# Patient Record
Sex: Female | Born: 1935 | Race: White | Hispanic: No | Marital: Married | Smoking: Never smoker
Health system: Southern US, Community
[De-identification: ages and names within clinical notes are randomized; demographics above are authoritative.]

## PROBLEM LIST (undated history)

## (undated) DIAGNOSIS — M1990 Unspecified osteoarthritis, unspecified site: Secondary | ICD-10-CM

## (undated) DIAGNOSIS — M797 Fibromyalgia: Secondary | ICD-10-CM

## (undated) DIAGNOSIS — M549 Dorsalgia, unspecified: Secondary | ICD-10-CM

## (undated) DIAGNOSIS — J45909 Unspecified asthma, uncomplicated: Secondary | ICD-10-CM

## (undated) DIAGNOSIS — K219 Gastro-esophageal reflux disease without esophagitis: Secondary | ICD-10-CM

## (undated) DIAGNOSIS — Z973 Presence of spectacles and contact lenses: Secondary | ICD-10-CM

## (undated) DIAGNOSIS — J329 Chronic sinusitis, unspecified: Secondary | ICD-10-CM

## (undated) DIAGNOSIS — M353 Polymyalgia rheumatica: Secondary | ICD-10-CM

## (undated) DIAGNOSIS — G8929 Other chronic pain: Secondary | ICD-10-CM

## (undated) DIAGNOSIS — F419 Anxiety disorder, unspecified: Secondary | ICD-10-CM

## (undated) DIAGNOSIS — M1711 Unilateral primary osteoarthritis, right knee: Secondary | ICD-10-CM

## (undated) HISTORY — PX: JOINT REPLACEMENT: SHX530

## (undated) HISTORY — PX: ABSCESS DRAINAGE: SHX1119

## (undated) HISTORY — PX: CATARACT EXTRACTION W/ INTRAOCULAR LENS  IMPLANT, BILATERAL: SHX1307

## (undated) HISTORY — PX: CORNEAL TRANSPLANT: SHX108

## (undated) HISTORY — PX: FRACTURE SURGERY: SHX138

## (undated) HISTORY — PX: EYE SURGERY: SHX253

---

## 2004-06-19 ENCOUNTER — Ambulatory Visit: Payer: Self-pay | Admitting: Internal Medicine

## 2004-07-23 ENCOUNTER — Ambulatory Visit: Payer: Self-pay | Admitting: Internal Medicine

## 2005-02-11 ENCOUNTER — Ambulatory Visit: Payer: Self-pay | Admitting: Internal Medicine

## 2005-07-29 ENCOUNTER — Ambulatory Visit: Payer: Self-pay | Admitting: Internal Medicine

## 2005-10-21 ENCOUNTER — Ambulatory Visit: Payer: Self-pay | Admitting: Internal Medicine

## 2006-11-04 ENCOUNTER — Ambulatory Visit: Payer: Self-pay | Admitting: Internal Medicine

## 2007-02-12 ENCOUNTER — Ambulatory Visit: Payer: Self-pay | Admitting: Family Medicine

## 2007-02-12 DIAGNOSIS — J069 Acute upper respiratory infection, unspecified: Secondary | ICD-10-CM | POA: Insufficient documentation

## 2007-02-12 LAB — CONVERTED CEMR LAB: Rapid Strep: NEGATIVE

## 2007-02-16 ENCOUNTER — Encounter: Payer: Self-pay | Admitting: Internal Medicine

## 2007-02-17 ENCOUNTER — Telehealth (INDEPENDENT_AMBULATORY_CARE_PROVIDER_SITE_OTHER): Payer: Self-pay | Admitting: *Deleted

## 2007-03-31 ENCOUNTER — Telehealth (INDEPENDENT_AMBULATORY_CARE_PROVIDER_SITE_OTHER): Payer: Self-pay | Admitting: *Deleted

## 2007-06-21 ENCOUNTER — Telehealth (INDEPENDENT_AMBULATORY_CARE_PROVIDER_SITE_OTHER): Payer: Self-pay | Admitting: *Deleted

## 2007-07-15 ENCOUNTER — Ambulatory Visit: Payer: Self-pay | Admitting: Internal Medicine

## 2007-07-15 DIAGNOSIS — I10 Essential (primary) hypertension: Secondary | ICD-10-CM | POA: Insufficient documentation

## 2007-07-15 DIAGNOSIS — E559 Vitamin D deficiency, unspecified: Secondary | ICD-10-CM | POA: Insufficient documentation

## 2007-07-15 DIAGNOSIS — R5381 Other malaise: Secondary | ICD-10-CM | POA: Insufficient documentation

## 2007-07-15 DIAGNOSIS — K209 Esophagitis, unspecified: Secondary | ICD-10-CM

## 2007-07-15 DIAGNOSIS — R5383 Other fatigue: Secondary | ICD-10-CM

## 2007-07-16 ENCOUNTER — Telehealth (INDEPENDENT_AMBULATORY_CARE_PROVIDER_SITE_OTHER): Payer: Self-pay | Admitting: *Deleted

## 2007-07-17 LAB — CONVERTED CEMR LAB
BUN: 14 mg/dL (ref 6–23)
Basophils Absolute: 0 10*3/uL (ref 0.0–0.1)
Basophils Relative: 0.6 % (ref 0.0–1.0)
CO2: 32 meq/L (ref 19–32)
Calcium: 9.4 mg/dL (ref 8.4–10.5)
Chloride: 100 meq/L (ref 96–112)
Creatinine, Ser: 1.1 mg/dL (ref 0.4–1.2)
Free T4: 1 ng/dL (ref 0.6–1.6)
MCHC: 32.5 g/dL (ref 30.0–36.0)
Monocytes Relative: 4 % (ref 3.0–11.0)
RBC: 4.85 M/uL (ref 3.87–5.11)
RDW: 12.2 % (ref 11.5–14.6)
TSH: 1.26 microintl units/mL (ref 0.35–5.50)

## 2007-07-19 ENCOUNTER — Encounter (INDEPENDENT_AMBULATORY_CARE_PROVIDER_SITE_OTHER): Payer: Self-pay | Admitting: *Deleted

## 2007-07-20 ENCOUNTER — Encounter (INDEPENDENT_AMBULATORY_CARE_PROVIDER_SITE_OTHER): Payer: Self-pay | Admitting: *Deleted

## 2007-07-22 ENCOUNTER — Encounter (INDEPENDENT_AMBULATORY_CARE_PROVIDER_SITE_OTHER): Payer: Self-pay | Admitting: *Deleted

## 2007-07-26 ENCOUNTER — Telehealth (INDEPENDENT_AMBULATORY_CARE_PROVIDER_SITE_OTHER): Payer: Self-pay | Admitting: *Deleted

## 2007-07-27 ENCOUNTER — Encounter: Payer: Self-pay | Admitting: Internal Medicine

## 2007-07-27 ENCOUNTER — Telehealth (INDEPENDENT_AMBULATORY_CARE_PROVIDER_SITE_OTHER): Payer: Self-pay | Admitting: *Deleted

## 2010-04-26 ENCOUNTER — Encounter: Admission: RE | Admit: 2010-04-26 | Discharge: 2010-04-26 | Payer: Self-pay | Admitting: Internal Medicine

## 2012-06-08 ENCOUNTER — Other Ambulatory Visit (HOSPITAL_COMMUNITY): Payer: Self-pay | Admitting: Internal Medicine

## 2012-06-14 ENCOUNTER — Encounter (HOSPITAL_COMMUNITY): Payer: Self-pay

## 2013-06-08 ENCOUNTER — Encounter (HOSPITAL_COMMUNITY)
Admission: EM | Disposition: A | Discharge status: Home / Self Care | Payer: Self-pay | Source: Home / Self Care | Attending: Orthopedic Surgery

## 2013-06-08 ENCOUNTER — Inpatient Hospital Stay (HOSPITAL_COMMUNITY): Payer: Medicare Other | Admitting: Anesthesiology

## 2013-06-08 ENCOUNTER — Inpatient Hospital Stay (HOSPITAL_COMMUNITY): Payer: Medicare Other

## 2013-06-08 ENCOUNTER — Emergency Department (HOSPITAL_COMMUNITY): Payer: Medicare Other

## 2013-06-08 ENCOUNTER — Encounter (HOSPITAL_COMMUNITY): Payer: Medicare Other | Admitting: Anesthesiology

## 2013-06-08 ENCOUNTER — Encounter (HOSPITAL_COMMUNITY): Payer: Self-pay | Admitting: Emergency Medicine

## 2013-06-08 ENCOUNTER — Inpatient Hospital Stay (HOSPITAL_COMMUNITY)
Admission: EM | Admit: 2013-06-08 | Discharge: 2013-06-10 | DRG: 494 | Disposition: A | Discharge status: Home / Self Care | Payer: Medicare Other | Attending: Orthopedic Surgery | Admitting: Orthopedic Surgery

## 2013-06-08 DIAGNOSIS — S82843B Displaced bimalleolar fracture of unspecified lower leg, initial encounter for open fracture type I or II: Principal | ICD-10-CM | POA: Diagnosis present

## 2013-06-08 DIAGNOSIS — M353 Polymyalgia rheumatica: Secondary | ICD-10-CM | POA: Diagnosis present

## 2013-06-08 DIAGNOSIS — Y92009 Unspecified place in unspecified non-institutional (private) residence as the place of occurrence of the external cause: Secondary | ICD-10-CM

## 2013-06-08 DIAGNOSIS — IMO0001 Reserved for inherently not codable concepts without codable children: Secondary | ICD-10-CM | POA: Diagnosis present

## 2013-06-08 DIAGNOSIS — S82891B Other fracture of right lower leg, initial encounter for open fracture type I or II: Secondary | ICD-10-CM | POA: Diagnosis present

## 2013-06-08 DIAGNOSIS — W19XXXA Unspecified fall, initial encounter: Secondary | ICD-10-CM | POA: Diagnosis present

## 2013-06-08 DIAGNOSIS — M129 Arthropathy, unspecified: Secondary | ICD-10-CM | POA: Diagnosis present

## 2013-06-08 DIAGNOSIS — S82899A Other fracture of unspecified lower leg, initial encounter for closed fracture: Secondary | ICD-10-CM | POA: Diagnosis present

## 2013-06-08 HISTORY — PX: ORIF ANKLE FRACTURE: SHX5408

## 2013-06-08 HISTORY — DX: Other fracture of unspecified lower leg, initial encounter for closed fracture: S82.899A

## 2013-06-08 HISTORY — DX: Other fracture of right lower leg, initial encounter for open fracture type I or II: S82.891B

## 2013-06-08 HISTORY — PX: I & D EXTREMITY: SHX5045

## 2013-06-08 HISTORY — DX: Polymyalgia rheumatica: M35.3

## 2013-06-08 HISTORY — DX: Unspecified osteoarthritis, unspecified site: M19.90

## 2013-06-08 HISTORY — DX: Fibromyalgia: M79.7

## 2013-06-08 LAB — CBC WITH DIFFERENTIAL/PLATELET
BASOS ABS: 0 10*3/uL (ref 0.0–0.1)
Basophils Relative: 0 % (ref 0–1)
EOS ABS: 0 10*3/uL (ref 0.0–0.7)
EOS PCT: 0 % (ref 0–5)
HEMATOCRIT: 39.8 % (ref 36.0–46.0)
Hemoglobin: 13.4 g/dL (ref 12.0–15.0)
Lymphocytes Relative: 6 % — ABNORMAL LOW (ref 12–46)
Lymphs Abs: 0.7 10*3/uL (ref 0.7–4.0)
MCH: 28.6 pg (ref 26.0–34.0)
MCHC: 33.7 g/dL (ref 30.0–36.0)
MCV: 85 fL (ref 78.0–100.0)
MONO ABS: 0.6 10*3/uL (ref 0.1–1.0)
Monocytes Relative: 5 % (ref 3–12)
Neutro Abs: 10.2 10*3/uL — ABNORMAL HIGH (ref 1.7–7.7)
Neutrophils Relative %: 88 % — ABNORMAL HIGH (ref 43–77)
PLATELETS: 320 10*3/uL (ref 150–400)
RBC: 4.68 MIL/uL (ref 3.87–5.11)
RDW: 13.7 % (ref 11.5–15.5)
WBC: 11.6 10*3/uL — ABNORMAL HIGH (ref 4.0–10.5)

## 2013-06-08 LAB — BASIC METABOLIC PANEL
BUN: 16 mg/dL (ref 6–23)
CALCIUM: 9.6 mg/dL (ref 8.4–10.5)
CO2: 29 mEq/L (ref 19–32)
CREATININE: 0.78 mg/dL (ref 0.50–1.10)
Chloride: 97 mEq/L (ref 96–112)
GFR calc Af Amer: >90 mL/min (ref >90)
GFR, EST NON AFRICAN AMERICAN: 79 mL/min — AB (ref >90)
GLUCOSE: 116 mg/dL — AB (ref 70–99)
Potassium: 3.9 mEq/L (ref 3.7–5.3)
SODIUM: 138 meq/L (ref 137–147)

## 2013-06-08 SURGERY — OPEN REDUCTION INTERNAL FIXATION (ORIF) ANKLE FRACTURE
Anesthesia: General | Site: Leg Lower | Laterality: Right

## 2013-06-08 MED ORDER — ONDANSETRON HCL 4 MG/2ML IJ SOLN: 4.0000 mg | Freq: Four times a day (QID) | INTRAMUSCULAR | Status: DC | PRN

## 2013-06-08 MED ORDER — ASPIRIN EC 325 MG PO TBEC: 325.0000 mg | DELAYED_RELEASE_TABLET | Freq: Every day | ORAL | Status: DC

## 2013-06-08 MED ORDER — SODIUM CHLORIDE 0.9 % IV SOLN
3.0000 g | Freq: Once | INTRAVENOUS | Status: AC
  Administered 2013-06-08: 3 g via INTRAVENOUS
  Filled 2013-06-08: qty 3

## 2013-06-08 MED ORDER — PREDNISONE 1 MG PO TABS
4.0000 mg | ORAL_TABLET | Freq: Every day | ORAL | Status: DC
  Filled 2013-06-08: qty 4

## 2013-06-08 MED ORDER — FENTANYL CITRATE 0.05 MG/ML IJ SOLN
INTRAMUSCULAR | Status: DC | PRN
  Administered 2013-06-08: 100 ug via INTRAVENOUS
  Administered 2013-06-08: 50 ug via INTRAVENOUS
  Administered 2013-06-08: 100 ug via INTRAVENOUS

## 2013-06-08 MED ORDER — MIDAZOLAM HCL 5 MG/5ML IJ SOLN
INTRAMUSCULAR | Status: DC | PRN
  Administered 2013-06-08: 2 mg via INTRAVENOUS

## 2013-06-08 MED ORDER — MORPHINE SULFATE 2 MG/ML IJ SOLN: 1.0000 mg | INTRAMUSCULAR | Status: DC | PRN

## 2013-06-08 MED ORDER — LIDOCAINE HCL (CARDIAC) 20 MG/ML IV SOLN
INTRAVENOUS | Status: DC | PRN
  Administered 2013-06-08: 50 mg via INTRAVENOUS

## 2013-06-08 MED ORDER — OXYCODONE-ACETAMINOPHEN 5-325 MG PO TABS
1.0000 | ORAL_TABLET | ORAL | Status: DC | PRN
  Administered 2013-06-08 – 2013-06-09 (×4): 2 via ORAL
  Administered 2013-06-10: 1 via ORAL
  Administered 2013-06-10: 2 via ORAL
  Administered 2013-06-10: 1 via ORAL
  Filled 2013-06-08 (×6): qty 2

## 2013-06-08 MED ORDER — METHOCARBAMOL 100 MG/ML IJ SOLN
500.0000 mg | Freq: Three times a day (TID) | INTRAVENOUS | Status: DC | PRN
  Administered 2013-06-08: 500 mg via INTRAVENOUS
  Filled 2013-06-08 (×2): qty 5

## 2013-06-08 MED ORDER — ACETAMINOPHEN 650 MG RE SUPP: 650.0000 mg | Freq: Four times a day (QID) | RECTAL | Status: DC | PRN

## 2013-06-08 MED ORDER — ROPIVACAINE HCL 5 MG/ML IJ SOLN
INTRAMUSCULAR | Status: DC | PRN
  Administered 2013-06-08: 20 mL via PERINEURAL

## 2013-06-08 MED ORDER — ALUM & MAG HYDROXIDE-SIMETH 200-200-20 MG/5ML PO SUSP: 30.0000 mL | Freq: Four times a day (QID) | ORAL | Status: DC | PRN

## 2013-06-08 MED ORDER — METOCLOPRAMIDE HCL 5 MG PO TABS
5.0000 mg | ORAL_TABLET | Freq: Three times a day (TID) | ORAL | Status: DC | PRN
  Filled 2013-06-08: qty 2

## 2013-06-08 MED ORDER — TETANUS-DIPHTHERIA TOXOIDS TD 5-2 LFU IM INJ
0.5000 mL | INJECTION | Freq: Once | INTRAMUSCULAR | Status: DC
  Filled 2013-06-08: qty 0.5

## 2013-06-08 MED ORDER — BUPIVACAINE HCL 0.25 % IJ SOLN
30.0000 mL | Freq: Once | INTRAMUSCULAR | Status: DC
  Filled 2013-06-08: qty 30

## 2013-06-08 MED ORDER — MORPHINE SULFATE 2 MG/ML IJ SOLN
2.0000 mg | Freq: Once | INTRAMUSCULAR | Status: AC
  Administered 2013-06-08: 2 mg via INTRAVENOUS
  Filled 2013-06-08: qty 1

## 2013-06-08 MED ORDER — KCL IN DEXTROSE-NACL 20-5-0.45 MEQ/L-%-% IV SOLN
INTRAVENOUS | Status: DC
  Administered 2013-06-08: 16:00:00 via INTRAVENOUS
  Filled 2013-06-08 (×3): qty 1000

## 2013-06-08 MED ORDER — POLYETHYLENE GLYCOL 3350 17 G PO PACK: 17.0000 g | PACK | Freq: Every day | ORAL | Status: DC | PRN

## 2013-06-08 MED ORDER — PROPOFOL 10 MG/ML IV BOLUS
INTRAVENOUS | Status: DC | PRN
  Administered 2013-06-08: 150 mg via INTRAVENOUS

## 2013-06-08 MED ORDER — KCL IN DEXTROSE-NACL 20-5-0.45 MEQ/L-%-% IV SOLN
INTRAVENOUS | Status: DC
  Administered 2013-06-09: 06:00:00 via INTRAVENOUS
  Filled 2013-06-08 (×6): qty 1000

## 2013-06-08 MED ORDER — ZOLPIDEM TARTRATE 5 MG PO TABS: 5.0000 mg | ORAL_TABLET | Freq: Every evening | ORAL | Status: DC | PRN

## 2013-06-08 MED ORDER — ONDANSETRON HCL 4 MG/2ML IJ SOLN
4.0000 mg | Freq: Once | INTRAMUSCULAR | Status: AC
  Administered 2013-06-08: 4 mg via INTRAVENOUS
  Filled 2013-06-08: qty 2

## 2013-06-08 MED ORDER — OXYCODONE HCL 5 MG PO TABS: 5.0000 mg | ORAL_TABLET | ORAL | Status: DC | PRN

## 2013-06-08 MED ORDER — CEFAZOLIN SODIUM-DEXTROSE 2-3 GM-% IV SOLR
INTRAVENOUS | Status: AC
  Filled 2013-06-08: qty 50

## 2013-06-08 MED ORDER — HYDROCODONE-ACETAMINOPHEN 5-325 MG PO TABS: 1.0000 | ORAL_TABLET | ORAL | Status: DC | PRN

## 2013-06-08 MED ORDER — ONDANSETRON HCL 4 MG/2ML IJ SOLN
INTRAMUSCULAR | Status: DC | PRN
  Administered 2013-06-08: 4 mg via INTRAVENOUS

## 2013-06-08 MED ORDER — ONDANSETRON HCL 4 MG PO TABS: 4.0000 mg | ORAL_TABLET | Freq: Four times a day (QID) | ORAL | Status: DC | PRN

## 2013-06-08 MED ORDER — CHLORHEXIDINE GLUCONATE 4 % EX LIQD
1.0000 "application " | Freq: Once | CUTANEOUS | Status: AC
  Filled 2013-06-08: qty 15

## 2013-06-08 MED ORDER — BUPIVACAINE-EPINEPHRINE PF 0.5-1:200000 % IJ SOLN
INTRAMUSCULAR | Status: DC | PRN
  Administered 2013-06-08: 20 mL via PERINEURAL

## 2013-06-08 MED ORDER — ACETAMINOPHEN 325 MG PO TABS: 650.0000 mg | ORAL_TABLET | Freq: Four times a day (QID) | ORAL | Status: DC | PRN

## 2013-06-08 MED ORDER — ONDANSETRON HCL 4 MG/2ML IJ SOLN: 4.0000 mg | Freq: Once | INTRAMUSCULAR | Status: DC | PRN

## 2013-06-08 MED ORDER — POLYETHYLENE GLYCOL 3350 17 G PO PACK
17.0000 g | PACK | Freq: Every day | ORAL | Status: DC | PRN
Start: 2013-06-08 — End: 2013-06-08
  Filled 2013-06-08: qty 1

## 2013-06-08 MED ORDER — DOCUSATE SODIUM 100 MG PO CAPS
100.0000 mg | ORAL_CAPSULE | Freq: Two times a day (BID) | ORAL | Status: DC
  Administered 2013-06-08 – 2013-06-10 (×4): 100 mg via ORAL
  Filled 2013-06-08 (×5): qty 1

## 2013-06-08 MED ORDER — FLEET ENEMA 7-19 GM/118ML RE ENEM: 1.0000 | ENEMA | Freq: Once | RECTAL | Status: DC | PRN

## 2013-06-08 MED ORDER — DIPHENHYDRAMINE HCL 12.5 MG/5ML PO ELIX: 12.5000 mg | ORAL_SOLUTION | ORAL | Status: DC | PRN

## 2013-06-08 MED ORDER — FLEET ENEMA 7-19 GM/118ML RE ENEM: 1.0000 | ENEMA | Freq: Once | RECTAL | Status: AC | PRN

## 2013-06-08 MED ORDER — BUPIVACAINE HCL 0.25 % IJ SOLN
10.0000 mL | Freq: Once | INTRAMUSCULAR | Status: DC
  Filled 2013-06-08: qty 10

## 2013-06-08 MED ORDER — CHLORHEXIDINE GLUCONATE 4 % EX LIQD
60.0000 mL | Freq: Once | CUTANEOUS | Status: AC
  Administered 2013-06-08: 4 via TOPICAL
  Filled 2013-06-08: qty 60

## 2013-06-08 MED ORDER — CEFAZOLIN SODIUM 1-5 GM-% IV SOLN
1.0000 g | Freq: Three times a day (TID) | INTRAVENOUS | Status: DC
  Administered 2013-06-09 – 2013-06-10 (×5): 1 g via INTRAVENOUS
  Filled 2013-06-08 (×6): qty 50

## 2013-06-08 MED ORDER — CEFAZOLIN SODIUM-DEXTROSE 2-3 GM-% IV SOLR
INTRAVENOUS | Status: DC | PRN
  Administered 2013-06-08: 2 g via INTRAVENOUS

## 2013-06-08 MED ORDER — DOCUSATE SODIUM 100 MG PO CAPS
100.0000 mg | ORAL_CAPSULE | Freq: Two times a day (BID) | ORAL | Status: DC
  Filled 2013-06-08 (×2): qty 1

## 2013-06-08 MED ORDER — PANTOPRAZOLE SODIUM 40 MG PO TBEC: 40.0000 mg | DELAYED_RELEASE_TABLET | Freq: Every day | ORAL | Status: DC

## 2013-06-08 MED ORDER — CEFAZOLIN SODIUM-DEXTROSE 2-3 GM-% IV SOLR: INTRAVENOUS | Status: DC | PRN

## 2013-06-08 MED ORDER — NIACIN 50 MG PO TABS
50.0000 mg | ORAL_TABLET | Freq: Every day | ORAL | Status: DC
  Filled 2013-06-08: qty 1

## 2013-06-08 MED ORDER — BISACODYL 5 MG PO TBEC: 5.0000 mg | DELAYED_RELEASE_TABLET | Freq: Every day | ORAL | Status: DC | PRN

## 2013-06-08 MED ORDER — TETANUS-DIPHTH-ACELL PERTUSSIS 5-2.5-18.5 LF-MCG/0.5 IM SUSP
0.5000 mL | Freq: Once | INTRAMUSCULAR | Status: AC
  Administered 2013-06-08: 0.5 mL via INTRAMUSCULAR
  Filled 2013-06-08: qty 0.5

## 2013-06-08 MED ORDER — SODIUM CHLORIDE 0.9 % IR SOLN
Status: DC | PRN
  Administered 2013-06-08: 1000 mL

## 2013-06-08 MED ORDER — MORPHINE SULFATE 4 MG/ML IJ SOLN: 4.0000 mg | Freq: Once | INTRAMUSCULAR | Status: DC

## 2013-06-08 MED ORDER — LIDOCAINE HCL (PF) 1 % IJ SOLN: 10.0000 mL | Freq: Once | INTRAMUSCULAR | Status: DC

## 2013-06-08 MED ORDER — LACTATED RINGERS IV SOLN
INTRAVENOUS | Status: DC | PRN
  Administered 2013-06-08: 17:00:00 via INTRAVENOUS

## 2013-06-08 MED ORDER — BISACODYL 5 MG PO TBEC
5.0000 mg | DELAYED_RELEASE_TABLET | Freq: Every day | ORAL | Status: DC | PRN
  Filled 2013-06-08: qty 1

## 2013-06-08 MED ORDER — METOCLOPRAMIDE HCL 5 MG/ML IJ SOLN: 5.0000 mg | Freq: Three times a day (TID) | INTRAMUSCULAR | Status: DC | PRN

## 2013-06-08 MED ORDER — FENTANYL CITRATE 0.05 MG/ML IJ SOLN: 25.0000 ug | INTRAMUSCULAR | Status: DC | PRN

## 2013-06-08 SURGICAL SUPPLY — 84 items
BANDAGE ELASTIC 4 VELCRO ST LF (GAUZE/BANDAGES/DRESSINGS) ×2 IMPLANT
BANDAGE ELASTIC 6 VELCRO ST LF (GAUZE/BANDAGES/DRESSINGS) ×3 IMPLANT
BANDAGE ESMARK 6X9 LF (GAUZE/BANDAGES/DRESSINGS) ×1 IMPLANT
BANDAGE GAUZE ELAST BULKY 4 IN (GAUZE/BANDAGES/DRESSINGS) ×3 IMPLANT
BIT DRILL 2.7 (BIT) IMPLANT
BIT DRILL CANN 2.7X625 NONSTRL (BIT) ×2 IMPLANT
BIT DRILL QC 3.5X110 (BIT) ×2 IMPLANT
BLADE SURG 10 STRL SS (BLADE) ×3 IMPLANT
BLADE SURG ROTATE 9660 (MISCELLANEOUS) IMPLANT
BNDG CMPR 9X6 STRL LF SNTH (GAUZE/BANDAGES/DRESSINGS)
BNDG CMPR MED 15X6 ELC VLCR LF (GAUZE/BANDAGES/DRESSINGS)
BNDG COHESIVE 4X5 TAN STRL (GAUZE/BANDAGES/DRESSINGS) ×1 IMPLANT
BNDG ELASTIC 6X15 VLCR STRL LF (GAUZE/BANDAGES/DRESSINGS) ×1 IMPLANT
BNDG ESMARK 6X9 LF (GAUZE/BANDAGES/DRESSINGS)
CHLORAPREP W/TINT 26ML (MISCELLANEOUS) ×3 IMPLANT
CLOTH BEACON ORANGE TIMEOUT ST (SAFETY) ×3 IMPLANT
COVER MAYO STAND STRL (DRAPES) ×3 IMPLANT
COVER SURGICAL LIGHT HANDLE (MISCELLANEOUS) ×3 IMPLANT
CUFF TOURNIQUET SINGLE 34IN LL (TOURNIQUET CUFF) ×3 IMPLANT
CUFF TOURNIQUET SINGLE 44IN (TOURNIQUET CUFF) IMPLANT
DRAPE C-ARMOR (DRAPES) ×2 IMPLANT
DRAPE OEC MINIVIEW 54X84 (DRAPES) IMPLANT
DRAPE SURG 17X23 STRL (DRAPES) ×3 IMPLANT
DRAPE U-SHAPE 47X51 STRL (DRAPES) ×3 IMPLANT
DRILL BIT 2.7 (BIT) ×3
DRSG ADAPTIC 3X8 NADH LF (GAUZE/BANDAGES/DRESSINGS) ×3 IMPLANT
DRSG PAD ABDOMINAL 8X10 ST (GAUZE/BANDAGES/DRESSINGS) ×6 IMPLANT
DURAPREP 26ML APPLICATOR (WOUND CARE) ×3 IMPLANT
ELECT REM PT RETURN 9FT ADLT (ELECTROSURGICAL) ×3
ELECTRODE REM PT RTRN 9FT ADLT (ELECTROSURGICAL) ×1 IMPLANT
GAUZE XEROFORM 1X8 LF (GAUZE/BANDAGES/DRESSINGS) ×1 IMPLANT
GLOVE BIO SURGEON STRL SZ7 (GLOVE) ×3 IMPLANT
GLOVE BIO SURGEON STRL SZ7.5 (GLOVE) ×3 IMPLANT
GLOVE BIOGEL PI IND STRL 8 (GLOVE) ×1 IMPLANT
GLOVE BIOGEL PI INDICATOR 8 (GLOVE) ×2
GOWN PREVENTION PLUS LG XLONG (DISPOSABLE) ×3 IMPLANT
GOWN PREVENTION PLUS XLARGE (GOWN DISPOSABLE) ×3 IMPLANT
GOWN STRL NON-REIN LRG LVL3 (GOWN DISPOSABLE) ×6 IMPLANT
HANDPIECE INTERPULSE COAX TIP (DISPOSABLE)
IMMOBILIZER KNEE 24 THIGH 36 (MISCELLANEOUS) IMPLANT
IMMOBILIZER KNEE 24 UNIV (MISCELLANEOUS)
K-WIRE 1.6X150 (WIRE) ×6
KIT BASIN OR (CUSTOM PROCEDURE TRAY) ×1 IMPLANT
KIT ROOM TURNOVER OR (KITS) ×3 IMPLANT
KWIRE 1.6X150 (WIRE) IMPLANT
MANIFOLD NEPTUNE II (INSTRUMENTS) ×3 IMPLANT
NEEDLE 22X1 1/2 (OR ONLY) (NEEDLE) IMPLANT
NS IRRIG 1000ML POUR BTL (IV SOLUTION) ×3 IMPLANT
PACK ORTHO EXTREMITY (CUSTOM PROCEDURE TRAY) ×3 IMPLANT
PAD ARMBOARD 7.5X6 YLW CONV (MISCELLANEOUS) ×6 IMPLANT
PAD CAST 4YDX4 CTTN HI CHSV (CAST SUPPLIES) ×1 IMPLANT
PADDING CAST COTTON 4X4 STRL (CAST SUPPLIES) ×3
PADDING CAST COTTON 6X4 STRL (CAST SUPPLIES) ×2 IMPLANT
PLATE LCP 3.5 1/3 TUB 7HX81 (Plate) ×2 IMPLANT
SCREW CANC FT ST SFS 4X16 (Screw) ×4 IMPLANT
SCREW CORTEX 3.5 12MM (Screw) ×2 IMPLANT
SCREW CORTEX 3.5 14MM (Screw) ×4 IMPLANT
SCREW CORTEX 3.5 30MM (Screw) ×2 IMPLANT
SCREW LOCK CORT ST 3.5X12 (Screw) IMPLANT
SCREW LOCK CORT ST 3.5X14 (Screw) IMPLANT
SCREW LOCK CORT ST 3.5X30 (Screw) IMPLANT
SCREW SHORT THREAD 4.0X40 (Screw) ×4 IMPLANT
SET HNDPC FAN SPRY TIP SCT (DISPOSABLE) ×1 IMPLANT
SPONGE GAUZE 4X4 12PLY (GAUZE/BANDAGES/DRESSINGS) ×1 IMPLANT
SPONGE GAUZE 4X4 12PLY STER LF (GAUZE/BANDAGES/DRESSINGS) ×2 IMPLANT
SPONGE LAP 18X18 X RAY DECT (DISPOSABLE) ×6 IMPLANT
SPONGE LAP 4X18 X RAY DECT (DISPOSABLE) ×2 IMPLANT
STAPLER VISISTAT 35W (STAPLE) ×3 IMPLANT
STOCKINETTE IMPERVIOUS 9X36 MD (GAUZE/BANDAGES/DRESSINGS) ×3 IMPLANT
SUCTION FRAZIER TIP 10 FR DISP (SUCTIONS) ×3 IMPLANT
SUT ETHILON 3 0 PS 1 (SUTURE) ×6 IMPLANT
SUT ETHILON 4 0 PS 2 18 (SUTURE) IMPLANT
SUT VIC AB 0 CTB1 27 (SUTURE) IMPLANT
SUT VIC AB 2-0 FS1 27 (SUTURE) IMPLANT
SUT VIC AB 3-0 SH 27 (SUTURE) ×9
SUT VIC AB 3-0 SH 27XBRD (SUTURE) IMPLANT
SYR CONTROL 10ML LL (SYRINGE) IMPLANT
TOWEL OR 17X24 6PK STRL BLUE (TOWEL DISPOSABLE) ×3 IMPLANT
TOWEL OR 17X26 10 PK STRL BLUE (TOWEL DISPOSABLE) ×3 IMPLANT
TUBE ANAEROBIC SPECIMEN COL (MISCELLANEOUS) IMPLANT
TUBE CONNECTING 12'X1/4 (SUCTIONS) ×1
TUBE CONNECTING 12X1/4 (SUCTIONS) ×2 IMPLANT
WATER STERILE IRR 1000ML POUR (IV SOLUTION) ×3 IMPLANT
YANKAUER SUCT BULB TIP NO VENT (SUCTIONS) ×3 IMPLANT

## 2013-06-08 NOTE — Progress Notes (Signed)
Orthopedic Tech Progress Note Patient Details:  Gypsy LoreCarol A Turbyfill 1935-04-04 [id number]  Patient ID: Gypsy Lorearol A Addison, female   DOB: 1935-04-04, 79 y.o.   MRN: [id number] As ordered by Dr. Docia Barrierhandler  Andilyn Bettcher 06/08/2013, 12:17 PM

## 2013-06-08 NOTE — Anesthesia Postprocedure Evaluation (Signed)
  Anesthesia Post-op Note  Patient: Shannon Montgomery  Procedure(s) Performed: Procedure(s): OPEN REDUCTION INTERNAL FIXATION (ORIF) ANKLE FRACTURE (Right) IRRIGATION AND DEBRIDEMENT EXTREMITY/RIGHT ANKLE (Right)  Patient Location: PACU  Anesthesia Type:General  Level of Consciousness: awake, alert , oriented and patient cooperative  Airway and Oxygen Therapy: Patient Spontanous Breathing  Post-op Pain: mild  Post-op Assessment: Post-op Vital signs reviewed, Patient's Cardiovascular Status Stable, Respiratory Function Stable, Patent Airway, No signs of Nausea or vomiting and Pain level controlled  Post-op Vital Signs: stable  Complications: No apparent anesthesia complications

## 2013-06-08 NOTE — Preoperative (Signed)
Beta Blockers   Reason not to administer Beta Blockers:Not Applicable 

## 2013-06-08 NOTE — Anesthesia Preprocedure Evaluation (Signed)
Anesthesia Evaluation  Patient identified by MRN, date of birth, ID band Patient awake    Reviewed: Allergy & Precautions, H&P , NPO status , Patient's Chart, lab work & pertinent test results  Airway Mallampati: I TM Distance: >3 FB Neck ROM: Full    Dental  (+) Teeth Intact and Dental Advisory Given   Pulmonary  breath sounds clear to auscultation        Cardiovascular hypertension, Pt. on medications Rhythm:Regular Rate:Normal     Neuro/Psych    GI/Hepatic   Endo/Other  Morbid obesity  Renal/GU      Musculoskeletal   Abdominal   Peds  Hematology   Anesthesia Other Findings   Reproductive/Obstetrics                           Anesthesia Physical Anesthesia Plan  ASA: II  Anesthesia Plan: General   Post-op Pain Management:    Induction: Intravenous  Airway Management Planned: Oral ETT  Additional Equipment:   Intra-op Plan:   Post-operative Plan: Extubation in OR  Informed Consent: I have reviewed the patients History and Physical, chart, labs and discussed the procedure including the risks, benefits and alternatives for the proposed anesthesia with the patient or authorized representative who has indicated his/her understanding and acceptance.   Dental advisory given  Plan Discussed with: CRNA, Anesthesiologist and Surgeon  Anesthesia Plan Comments:         Anesthesia Quick Evaluation

## 2013-06-08 NOTE — Anesthesia Procedure Notes (Addendum)
Anesthesia Regional Block:  Popliteal block  Pre-Anesthetic Checklist: ,, timeout performed, Correct Patient, Correct Site, Correct Laterality, Correct Procedure, Correct Position, site marked, Risks and benefits discussed,  Surgical consent,  Pre-op evaluation,  At surgeon's request and post-op pain management  Laterality: Right and Lower  Prep: chloraprep       Needles:  Injection technique: Single-shot  Needle Type: Echogenic Needle     Needle Length: 9cm  Needle Gauge: 21 and 21 G    Additional Needles:  Procedures: ultrasound guided (picture in chart) Popliteal block Narrative:  Start time: 06/08/2013 5:41 PM End time: 06/08/2013 5:50 PM Injection made incrementally with aspirations every 5 mL.  Performed by: Personally  Anesthesiologist: Sheldon Silvanavid Crews, MD   Anesthesia Regional Block:  Adductor canal block  Pre-Anesthetic Checklist: ,, timeout performed, Correct Patient, Correct Site, Correct Laterality, Correct Procedure, Correct Position, site marked, Risks and benefits discussed,  Surgical consent,  Pre-op evaluation,  At surgeon's request and post-op pain management  Laterality: Right and Lower  Prep: chloraprep       Needles:  Injection technique: Single-shot  Needle Type: Echogenic Needle     Needle Length: 9cm 9 cm Needle Gauge: 21 and 21 G    Additional Needles:  Procedures: ultrasound guided (picture in chart) Adductor canal block Narrative:  Start time: 06/08/2013 5:43 PM End time: 06/08/2013 5:50 PM Injection made incrementally with aspirations every 5 mL.  Performed by: Personally  Anesthesiologist: Sheldon Silvanavid Crews, MD   Procedure Name: LMA Insertion Date/Time: 06/08/2013 6:07 PM Performed by: Gwenyth AllegraADAMI, Sarra Rachels Pre-anesthesia Checklist: Emergency Drugs available, Patient identified, Timeout performed, Suction available and Patient being monitored Patient Re-evaluated:Patient Re-evaluated prior to inductionOxygen Delivery Method: Circle system  utilized Preoxygenation: Pre-oxygenation with 100% oxygen Intubation Type: IV induction LMA: LMA inserted LMA Size: 4.0 Number of attempts: 1 Placement Confirmation: breath sounds checked- equal and bilateral and positive ETCO2 Tube secured with: Tape Dental Injury: Teeth and Oropharynx as per pre-operative assessment

## 2013-06-08 NOTE — H&P (Signed)
Seen and agree with above.  Open bimalleolar ankle fracture this am. Cleansed and IV abx in ED, splinted. To OR this afternoon for formal I&D and ORIF.  Will need 48 hr post op abx due to open fx.

## 2013-06-08 NOTE — Progress Notes (Signed)
Orthopedic Tech Progress Note Patient Details:  Shannon Montgomery [birth year] [id number]  Ortho Devices Type of Ortho Device: Ace wrap;Post (short leg) splint;Stirrup splint Ortho Device/Splint Location: rle Ortho Device/Splint Interventions: Application   Radford Pease 06/08/2013, 12:17 PM

## 2013-06-08 NOTE — ED Notes (Signed)
Orthopedic surgery at bedside

## 2013-06-08 NOTE — Op Note (Signed)
Procedure(s): OPEN REDUCTION INTERNAL FIXATION (ORIF) ANKLE FRACTURE IRRIGATION AND DEBRIDEMENT EXTREMITY/RIGHT ANKLE Procedure Note  Shannon Montgomery female 79 y.o. 06/08/2013  Procedure(s) and Anesthesia Type:    * OPEN REDUCTION INTERNAL FIXATION (ORIF) RIGHT BIMALLEOLAR ANKLE FRACTURE dislocation - General    * IRRIGATION AND DEBRIDEMENT OPEN FRACTURE RIGHT LEG - General  Surgeon(s) and Role:    * Mable Paris, MD - Primary   Indications:  79 y.o. female s/p fall with right open ankle fracture dislocation. Indicated for surgery to decrease risk of infection and promote anatomic restoration of joint.     Surgeon: Mable Paris   Assistants: Damita Lack PA-C [facility] was present and scrubbed throughout the procedure and was essential in positioning, retraction, exposure, and closure)  Anesthesia: General endotracheal anesthesia with preoperative block given by the attending anesthesiologist      Procedure Detail  OPEN REDUCTION INTERNAL FIXATION (ORIF) ANKLE FRACTURE, IRRIGATION AND DEBRIDEMENT EXTREMITY/RIGHT ANKLE  Findings: 2 cm open wound medially. Copiously irrigated and debrided, no gross contamination. The lateral Weber B. fibular fracture fixed with a 7 hole locking plate. Medial malleolus fracture fixed with 2 4-0 partially-threaded cancellus screws.  Estimated Blood Loss:  Minimal         Drains: none  Blood Given: none         Specimens: none        Complications:  * No complications entered in OR log *         Disposition: PACU - hemodynamically stable.         Condition: stable  Tourniquet time: 49 minutes at 350 mm mercury    Procedure:  The patient was identified in the preoperative  holding area where I personally marked the operative site after  verifying site side and procedure with the patient. The patient was taken back  to the operating room where general anesthesia was induced without  Complication. The  patient was placed in supine position with a bump under the operative hip. A non sterile tourniquet was applied to the thigh. The patient did receive IV antibiotics prior to the incision.   The limb was prepped with Betadine scrub and paint.   After the appropriate time-out, the limb was exsanguinated and the tourniquet was elevated to 350 mmHg.   A lateral incision was made over the fracture site and dissection was carried down the lateral fibula.  The fracture was a exposed and cleaned of hematoma.  Reduction was carried out using reduction forceps and manipulation of the ankle.  An interfragmentary lag screw was placed.  A 7 hole plate was laid laterally and felt to be appropriate sized.  Holes proximal and distal to the fracture were sequentially drill, measured and filled with appropriate sized bicortical and unicortical screws.  Appropriate length and alignment were verified on fluoroscopic imaging.    Attention was then turned to the medial side where the open wound was extended distally over the fracture site to the tip of the needle malleolus.  Dissection was carried down to the fracture and the fracture was exposed. The joint and wound were copiously irrigated. There is no gross contamination in the wound.  No loose fragment were noted in the joint.  The fracture was held anatomically reduced with a reduction forceps and two guidewires were passed across the fracture. The reduction and pin position was verified with fluoro and then 2 4-0 mm partially threaded cancellous screws were placed after over drilling the outer cortex.  Good fixation  was noted.  The syndesmosis was stressed and felt to be intact.  The wounds were then again copiously irrigated and closed in layers with 3-0 vicryl in a deep layer and 3-0 nylon for skin closure.  Sterile dressings were then applied and well padded well molded splint in a plantigrade position was applied.  The tourniquet was let down for total tourniquet time  of 49 minutes.  The patient was then allowed to awaken from GA, taken to the PACU in stable condition.  POSTOPERATIVE PLAN: The patient will be non-weightbearing on the operative Extremity . She will be kept in the hospital for IV antibiotics given the open fracture for 48 hours. She'll have physical therapy for gait training and ambulation.

## 2013-06-08 NOTE — Transfer of Care (Signed)
Immediate Anesthesia Transfer of Care Note  Patient: Shannon Montgomery  Procedure(s) Performed: Procedure(s): OPEN REDUCTION INTERNAL FIXATION (ORIF) ANKLE FRACTURE (Right) IRRIGATION AND DEBRIDEMENT EXTREMITY/RIGHT ANKLE (Right)  Patient Location: PACU  Anesthesia Type:GA combined with regional for post-op pain  Level of Consciousness: awake, sedated and patient cooperative  Airway & Oxygen Therapy: Patient Spontanous Breathing and Patient connected to nasal cannula oxygen  Post-op Assessment: Report given to PACU RN and Post -op Vital signs reviewed and stable  Post vital signs: Reviewed and stable  Complications: No apparent anesthesia complications

## 2013-06-08 NOTE — H&P (Signed)
Shannon Montgomery is an 79 y.o. female.   Chief Complaint: right ankle open fracture HPI: The patient is a pleasant 79 yo female with hx of right ankle fracture treated conservatively, successfully, over 10 years ago. This am, she was walking out to porch when her right ankle hit the door stop, she fell twisting her ankle. Had immediate pain/deformity and an open ankle wound. Transported to ED via EMS. xrays showed right mortise ankle joint fracture dislocation right ankle. Ankle was just reduced in ED and patient reports substantial relief of pain. Continues to have right ankle pain and calf spasms. Worse with movement and better with rest. Patient is ambulatory without assisted devices at baseline.   Past Medical History  Diagnosis Date  . Closed right ankle fracture   . Arthritis   . Fibromyalgia   . PMR (polymyalgia rheumatica)   . Cataract     Past Surgical History  Procedure Laterality Date  . Eye surgery    . Cornea transplants      History reviewed. No pertinent family history. Social History:  reports that she has never smoked. She has never used smokeless tobacco. She reports that she does not drink alcohol or use illicit drugs.  Allergies:  Allergies  Allergen Reactions  . Doxycycline Nausea Only  . Erythromycin Nausea Only     (Not in a hospital admission)  Results for orders placed during the hospital encounter of 06/08/13 (from the past 48 hour(s))  CBC WITH DIFFERENTIAL     Status: Abnormal   Collection Time    06/08/13 11:00 AM      Result Value Range   WBC 11.6 (*) 4.0 - 10.5 K/uL   RBC 4.68  3.87 - 5.11 MIL/uL   Hemoglobin 13.4  12.0 - 15.0 g/dL   HCT 39.8  36.0 - 46.0 %   MCV 85.0  78.0 - 100.0 fL   MCH 28.6  26.0 - 34.0 pg   MCHC 33.7  30.0 - 36.0 g/dL   RDW 13.7  11.5 - 15.5 %   Platelets 320  150 - 400 K/uL   Neutrophils Relative % 88 (*) 43 - 77 %   Neutro Abs 10.2 (*) 1.7 - 7.7 K/uL   Lymphocytes Relative 6 (*) 12 - 46 %   Lymphs Abs 0.7  0.7 -  4.0 K/uL   Monocytes Relative 5  3 - 12 %   Monocytes Absolute 0.6  0.1 - 1.0 K/uL   Eosinophils Relative 0  0 - 5 %   Eosinophils Absolute 0.0  0.0 - 0.7 K/uL   Basophils Relative 0  0 - 1 %   Basophils Absolute 0.0  0.0 - 0.1 K/uL  BASIC METABOLIC PANEL     Status: Abnormal   Collection Time    06/08/13 11:00 AM      Result Value Range   Sodium 138  137 - 147 mEq/L   Potassium 3.9  3.7 - 5.3 mEq/L   Chloride 97  96 - 112 mEq/L   CO2 29  19 - 32 mEq/L   Glucose, Bld 116 (*) 70 - 99 mg/dL   BUN 16  6 - 23 mg/dL   Creatinine, Ser 0.78  0.50 - 1.10 mg/dL   Calcium 9.6  8.4 - 10.5 mg/dL   GFR calc non Af Amer 79 (*) >90 mL/min   GFR calc Af Amer >90  >90 mL/min   Comment: (NOTE)     The eGFR has been calculated using  the CKD EPI equation.     This calculation has not been validated in all clinical situations.     eGFR's persistently <90 mL/min signify possible Chronic Kidney     Disease.   Dg Ankle 2 Views Left  06/08/2013   CLINICAL DATA:  79 year old female status post fall with pain. Initial encounter.  EXAM: LEFT ANKLE - 2 VIEW  COMPARISON:  None.  FINDINGS: Severe fracture dislocation at the right ankle. Oblique comminuted fractures of the distal right fibula, and medial malleolus with posterior and lateral mortise joint dislocation. Probable more nondisplaced fracture of the posterior malleolus. Associated soft tissue swelling. Cannot exclude trace subcutaneous gas anteriorly (arrow). The talus and calcaneus appear to remain intact.  IMPRESSION: 1. Comminuted right mortise joint fracture dislocation. Suspect a try malleolar fracture. 2. Cannot exclude trace subcutaneous gas anteriorly, such as due to open fracture. Clinical correlation recommended.   Electronically Signed   By: Lars Pinks M.D.   On: 06/08/2013 10:18   Dg Knee Right Port  06/08/2013   CLINICAL DATA:  79 year old female with knee pain after injury. Severe right ankle fracture. Initial encounter.  EXAM: PORTABLE RIGHT  KNEE - 1-2 VIEW  COMPARISON:  Right ankle series from the same day reported separately.  FINDINGS: Portable AP and cross-table lateral views of the right knee. Bone mineralization is within normal limits for age. Patella intact. No joint effusion identified. Lateral compartment joint space loss and subchondral sclerosis. No acute fracture identified.  IMPRESSION: No acute fracture or dislocation identified about the right knee.   Electronically Signed   By: Lars Pinks M.D.   On: 06/08/2013 11:58    Review of Systems  Musculoskeletal:       Right ankle pain and right elbow pain   All other systems reviewed and are negative.    Blood pressure 117/58, pulse 85, temperature 98.1 F (36.7 C), temperature source Oral, resp. rate 18, height _0  (1.651 m), weight 90.719 kg (200 lb), SpO2 97.00%. Physical Exam  Constitutional: She is oriented to person, place, and time. She appears well-developed and well-nourished.  HENT:  Head: Normocephalic and atraumatic.  Eyes: Conjunctivae and EOM are normal.  Neck: Normal range of motion.  Cardiovascular: Normal rate and intact distal pulses.   Respiratory: Effort normal.  Musculoskeletal:  Right elbow with superficial skin abrasion, full ROM and nontender to palpation Right ankle with 2 cm wound over medial malleolus with bone exposed, mild swelling, wiggles toes, distally NVI Full ankle exam limited by injury and pain   Neurological: She is alert and oriented to person, place, and time.  Skin: Skin is warm and dry.  Psychiatric: She has a normal mood and affect. Her behavior is normal. Judgment and thought content normal.     Assessment/Plan Right ankle comminuted mortise fracture dislocation, reduced in ED Likely reduced successfully and splinted, will get ankle xrays to confirm Will plan on I & D and ORIF right ankle this afternoon NWB right LE NPO Will need 48 hrs anabiotics and will admit as inpatient   Meredeth Furber 06/08/2013, 12:41  PM
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
## 2013-06-08 NOTE — Progress Notes (Signed)
Utilization review completed.  

## 2013-06-08 NOTE — Progress Notes (Signed)
1316- Informed receiving patient from ED. 1317- called ER for report on patient.

## 2013-06-08 NOTE — ED Notes (Signed)
Pt from home via [facility] c/o right ankle fracture from slipping at her deck door.  Bleeding noted to right ankle, some calf pain, small abrasion to right wrist with no pain.  Hx of right ankle fx around 40 years ago.  Pt in NAD, A&O.

## 2013-06-09 LAB — CBC
HCT: 34.7 % — ABNORMAL LOW (ref 36.0–46.0)
Hemoglobin: 11.4 g/dL — ABNORMAL LOW (ref 12.0–15.0)
MCH: 28.1 pg (ref 26.0–34.0)
MCHC: 32.9 g/dL (ref 30.0–36.0)
MCV: 85.7 fL (ref 78.0–100.0)
Platelets: 253 10*3/uL (ref 150–400)
RBC: 4.05 MIL/uL (ref 3.87–5.11)
RDW: 13.7 % (ref 11.5–15.5)
WBC: 7 10*3/uL (ref 4.0–10.5)

## 2013-06-09 MED ORDER — ASPIRIN EC 325 MG PO TBEC
325.0000 mg | DELAYED_RELEASE_TABLET | Freq: Two times a day (BID) | ORAL | Status: DC
  Administered 2013-06-09 – 2013-06-10 (×3): 325 mg via ORAL
  Filled 2013-06-09 (×5): qty 1

## 2013-06-09 MED ORDER — FLUOROMETHOLONE 0.1 % OP SUSP
1.0000 [drp] | Freq: Two times a day (BID) | OPHTHALMIC | Status: DC
  Administered 2013-06-09 – 2013-06-10 (×2): 1 [drp] via OPHTHALMIC
  Filled 2013-06-09: qty 5

## 2013-06-09 MED ORDER — PREDNISONE 1 MG PO TABS
4.0000 mg | ORAL_TABLET | Freq: Every day | ORAL | Status: DC
  Administered 2013-06-09 – 2013-06-10 (×2): 4 mg via ORAL
  Filled 2013-06-09 (×3): qty 4

## 2013-06-09 MED ORDER — FLUOROMETHOLONE 0.1 % OP OINT
TOPICAL_OINTMENT | Freq: Two times a day (BID) | OPHTHALMIC | Status: DC
  Filled 2013-06-09: qty 3.5

## 2013-06-09 MED ORDER — NIACIN 50 MG PO TABS
50.0000 mg | ORAL_TABLET | Freq: Every day | ORAL | Status: DC
  Administered 2013-06-09: 50 mg via ORAL
  Filled 2013-06-09 (×2): qty 1

## 2013-06-09 MED ORDER — PANTOPRAZOLE SODIUM 40 MG PO TBEC
40.0000 mg | DELAYED_RELEASE_TABLET | Freq: Every day | ORAL | Status: DC
  Administered 2013-06-10: 40 mg via ORAL
  Filled 2013-06-09: qty 1

## 2013-06-09 MED ORDER — METOCLOPRAMIDE HCL 10 MG PO TABS
10.0000 mg | ORAL_TABLET | Freq: Three times a day (TID) | ORAL | Status: DC | PRN
  Administered 2013-06-09 – 2013-06-10 (×2): 10 mg via ORAL
  Filled 2013-06-09 (×2): qty 1

## 2013-06-09 NOTE — Evaluation (Signed)
Physical Therapy Evaluation Patient Details Name: Shannon Montgomery MRN: [id number] DOB: 03-13-1935 Today's Date: 06/09/2013 Time: 4098-1191 PT Time Calculation (min): 29 min  PT Assessment / Plan / Recommendation History of Present Illness  Pt is a 79 y/o female admitted s/p fall at home. Pt is now s/p ORIF of R ankle fracture.  Clinical Impression  This patient presents with acute pain and decreased functional independence following the above mentioned procedure. At the time of PT eval, pt had difficulty maintaining NWB status on R, and continually put weight through heel. Pt educated and frequently cued for NWB status throughout session. This patient is appropriate for skilled PT interventions to address functional limitations, improve safety and independence with functional mobility, and return to PLOF.     PT Assessment  Patient needs continued PT services    Follow Up Recommendations  Home health PT    Does the patient have the potential to tolerate intense rehabilitation      Barriers to Discharge        Equipment Recommendations  Rolling walker with 5" wheels;3in1 (PT)    Recommendations for Other Services     Frequency Min 5X/week    Precautions / Restrictions Precautions Precautions: Fall Restrictions Weight Bearing Restrictions: Yes RLE Weight Bearing: Non weight bearing   Pertinent Vitals/Pain 8 at rest at beginning of session, 6 at end of session in chair.       Mobility  Bed Mobility Overal bed mobility: Needs Assistance Bed Mobility: Supine to Sit Supine to sit: Min assist General bed mobility comments: Assist for trunk elevation to come to full sit.  Transfers Overall transfer level: Needs assistance Equipment used: Rolling walker (2 wheeled) Transfers: Sit to/from [employer] Sit to Stand: Min assist Stand pivot transfers: Min assist General transfer comment: Pt able to come to full stand with min assist, and perform SPT to [facility] and  then to recliner with min assist for occasional walker position. VC's for NWB status on R, however pt had difficulty maintaining this.  Ambulation/Gait General Gait Details: NT at this time    Exercises General Exercises - Lower Extremity Ankle Circles/Pumps: 10 reps Quad Sets: 10 reps   PT Diagnosis: Difficulty walking;Acute pain  PT Problem List: Decreased strength;Decreased range of motion;Decreased activity tolerance;Decreased balance;Decreased mobility;Decreased knowledge of use of DME;Decreased safety awareness;Pain;Decreased knowledge of precautions PT Treatment Interventions: DME instruction;Gait training;Stair training;Functional mobility training;Therapeutic activities;Therapeutic exercise;Neuromuscular re-education;Patient/family education     PT Goals(Current goals can be found in the care plan section) Acute Rehab PT Goals Patient Stated Goal: To return to PLOF PT Goal Formulation: With patient Time For Goal Achievement: 06/23/13 Potential to Achieve Goals: Good  Visit Information  Last PT Received On: 06/09/13 Assistance Needed: +1 History of Present Illness: Pt is a 79 y/o female admitted s/p fall at home. Pt is now s/p ORIF of R ankle fracture.       Prior Functioning  Home Living Family/patient expects to be discharged to:: Private residence Living Arrangements: Spouse/significant other Available Help at Discharge: Family;Available 24 hours/day Type of Home: House Home Access: Stairs to enter [employer] of Steps: 2 steps to front door Entrance Stairs-Rails: None Home Layout: Two level;Able to live on main level with bedroom/bathroom Home Equipment: Gilmer Mor - single point (walk-in shower) Prior Function Level of Independence: Independent Communication Communication: No difficulties Dominant Hand: Right    Cognition  Cognition Arousal/Alertness: Awake/alert Behavior During Therapy: WFL for tasks assessed/performed Overall Cognitive Status:  Within Functional Limits for  tasks assessed    Extremity/Trunk Assessment Upper Extremity Assessment Upper Extremity Assessment: Overall WFL for tasks assessed Lower Extremity Assessment Lower Extremity Assessment: RLE deficits/detail RLE Deficits / Details: Decreased strength and AROM consistent with procedure.  RLE: Unable to fully assess due to pain;Unable to fully assess due to immobilization Cervical / Trunk Assessment Cervical / Trunk Assessment: Normal   Balance Balance Overall balance assessment: Needs assistance Sitting-balance support: Feet supported;Bilateral upper extremity supported Sitting balance-Leahy Scale: Good Standing balance support: Bilateral upper extremity supported Standing balance-Leahy Scale: Fair  End of Session PT - End of Session Equipment Utilized During Treatment: Gait belt Activity Tolerance: Patient tolerated treatment well Patient left: in chair;with call bell/phone within reach Nurse Communication: Mobility status  GP     Shannon CancerHamilton, Shannon Montgomery 06/09/2013, 4:54 PM  Shannon CancerLaura Hamilton, PT, DPT [phone]

## 2013-06-09 NOTE — Progress Notes (Signed)
PATIENT ID: Shannon Montgomery   1 Day Post-Op Procedure(s) (LRB): OPEN REDUCTION INTERNAL FIXATION (ORIF) ANKLE FRACTURE (Right) IRRIGATION AND DEBRIDEMENT EXTREMITY/RIGHT ANKLE (Right)  Subjective: Doing well 1 day out from surgery. Soreness in right calf where she hit it on the step when she fell. Said this area was sore immediately after the accident.  No other complaints or concerns.   Objective:  Filed Vitals:   06/09/13 0423  BP: 128/76  Pulse: 86  Temp: 98.6 F (37 C)  Resp: 16     Awake, alert, orientated R LE splint c/d/i Wiggles toes, distally nvi Calf soft, nontender  Labs:   Recent Labs  06/08/13 1100 06/09/13 0305  HGB 13.4 11.4*   Recent Labs  06/08/13 1100 06/09/13 0305  WBC 11.6* 7.0  RBC 4.68 4.05  HCT 39.8 34.7*  PLT 320 253   Recent Labs  06/08/13 1100  NA 138  K 3.9  CL 97  CO2 29  BUN 16  CREATININE 0.78  GLUCOSE 116*  CALCIUM 9.6    Assessment and Plan: 1 day s/p I & D, ORIF right open ankle fracture Splint for 2 weeks Continue with current pain mgmt Up with PT today IV abx x 48 hrs Will plan for d/c home tomorrow when IV abx complete NWB R LE  VTE proph: ASA 325mg  BID, SCDs

## 2013-06-10 ENCOUNTER — Encounter (HOSPITAL_COMMUNITY): Payer: Self-pay | Admitting: Orthopedic Surgery

## 2013-06-10 MED ORDER — OXYCODONE-ACETAMINOPHEN 5-325 MG PO TABS: 1.0000 | ORAL_TABLET | ORAL | Status: DC | PRN

## 2013-06-10 NOTE — Discharge Instructions (Signed)
Cast or Splint Care Casts and splints support injured limbs and keep bones from moving while they heal. It is important to care for your cast or splint at home.  HOME CARE INSTRUCTIONS  Keep the cast or splint uncovered during the drying period. It can take 24 to 48 hours to dry if it is made of plaster. A fiberglass cast will dry in less than 1 hour.  Do not rest the cast on anything harder than a pillow for the first 24 hours.  Do not put weight on your injured limb or apply pressure to the cast until your health care provider gives you permission.  Keep the cast or splint dry. Wet casts or splints can lose their shape and may not support the limb as well. A wet cast that has lost its shape can also create harmful pressure on your skin when it dries. Also, wet skin can become infected.  Cover the cast or splint with a plastic bag when bathing or when out in the rain or snow. If the cast is on the trunk of the body, take sponge baths until the cast is removed.  If your cast does become wet, dry it with a towel or a blow dryer on the cool setting only.  Keep your cast or splint clean. Soiled casts may be wiped with a moistened cloth.  Do not place any hard or soft foreign objects under your cast or splint, such as cotton, toilet paper, lotion, or powder.  Do not try to scratch the skin under the cast with any object. The object could get stuck inside the cast. Also, scratching could lead to an infection. If itching is a problem, use a blow dryer on a cool setting to relieve discomfort.  Do not trim or cut your cast or remove padding from inside of it.  Exercise all joints next to the injury that are not immobilized by the cast or splint. For example, if you have a long leg cast, exercise the hip joint and toes. If you have an arm cast or splint, exercise the shoulder, elbow, thumb, and fingers.  Elevate your injured arm or leg on 1 or 2 pillows for the first 1 to 3 days to decrease  swelling and pain.It is best if you can comfortably elevate your cast so it is higher than your heart. SEEK MEDICAL CARE IF:   Your cast or splint cracks.  Your cast or splint is too tight or too loose.  You have unbearable itching inside the cast.  Your cast becomes wet or develops a soft spot or area.  You have a bad smell coming from inside your cast.  You get an object stuck under your cast.  Your skin around the cast becomes red or raw.  You have new pain or worsening pain after the cast has been applied. SEEK IMMEDIATE MEDICAL CARE IF:   You have fluid leaking through the cast.  You are unable to move your fingers or toes.  You have discolored (blue or white), cool, painful, or very swollen fingers or toes beyond the cast.  You have tingling or numbness around the injured area.  You have severe pain or pressure under the cast.  You have any difficulty with your breathing or have shortness of breath.  You have chest pain. Document Released: 05/09/2000 Document Revised: 03/02/2013 Document Reviewed: 11/18/2012 [facility] Patient Information 2014 [place] [place].   Elevate leg, wiggle toes frequently , ice if needed  Keep splint clean,  dry and intact Pain medicine has been prescribed for you.  Leave the dressing in place until your follow-up appointment  Take one aspirin a day for 2 weeks after surgery, unless you have an aspirin sensitivity/ allergy or asthma.  DO NOT take ANY nonsteroidal anti-inflammatory pain medications: Advil, Motrin, Ibuprofen, Aleve, Naproxen, or Narprosyn.   Please call [phone] during normal business hours or [phone] after hours for any problems. Including the following:  - excessive redness of the incisions - drainage for more than 4 days - fever of more than 101.5 F  *Please note that pain medications will not be refilled after hours or on weekends.

## 2013-06-10 NOTE — Progress Notes (Signed)
Physical Therapy Treatment Patient Details Name: Shannon Montgomery MRN: [id number] DOB: [birth year] Today's Date: 06/10/2013 Time: [id number] PT Time Calculation (min): 17 min  PT Assessment / Plan / Recommendation  History of Present Illness Pt is a 79 y/o female admitted s/p fall at home. Pt is now s/p ORIF of R ankle fracture.   PT Comments   Pt very pleasant & willing to participate in therapy.  Presents at min guard level for sit<>stand & stand-pivot transfers.  Pt unable to use UE's sufficiently enough to take hops but does well with "shimmy" technique.  Pt states she will be borrowing a w/c from a friend.     Follow Up Recommendations  Home health PT     Does the patient have the potential to tolerate intense rehabilitation     Barriers to Discharge        Equipment Recommendations  Rolling walker with 5" wheels;3in1 (PT)    Recommendations for Other Services    Frequency Min 5X/week   Progress towards PT Goals Progress towards PT goals: Progressing toward goals  Plan Current plan remains appropriate    Precautions / Restrictions Precautions Precautions: Fall Restrictions Weight Bearing Restrictions: Yes RLE Weight Bearing: Non weight bearing       Mobility  Bed Mobility Overal bed mobility: Needs Assistance Bed Mobility: Supine to Sit Supine to sit: Min assist General bed mobility comments: Min (A) needed to manage RLE to EOB only Transfers Overall transfer level: Needs assistance Equipment used: Rolling walker (2 wheeled) Transfers: Sit to/from [employer];Stand Pivot Transfers Sit to Stand: Min guard Stand pivot transfers: Min guard General transfer comment: cues to reinforce NWBing.  Pt uses "shimmy" technique to pivot.      Exercises General Exercises - Lower Extremity Quad Sets: AROM;Both;10 reps Long Arc Quad: AROM;Strengthening;Right;10 reps Heel Slides: AROM;AAROM;Right;10 reps Hip ABduction/ADduction: AAROM;Strengthening;Right;10 reps Straight Leg  Raises: AAROM;Strengthening;Right;10 reps     PT Goals (current goals can now be found in the care plan section) Acute Rehab PT Goals Patient Stated Goal: To return to PLOF PT Goal Formulation: With patient Time For Goal Achievement: 06/23/13 Potential to Achieve Goals: Good  Visit Information  Last PT Received On: 06/10/13 Assistance Needed: +1 History of Present Illness: Pt is a 79 y/o female admitted s/p fall at home. Pt is now s/p ORIF of R ankle fracture.    Subjective Data  Patient Stated Goal: To return to PLOF   Cognition  Cognition Arousal/Alertness: Awake/alert Behavior During Therapy: WFL for tasks assessed/performed Overall Cognitive Status: Within Functional Limits for tasks assessed    Balance     End of Session PT - End of Session Activity Tolerance: Patient tolerated treatment well Patient left: in chair;with call bell/phone within reach Nurse Communication: Mobility status   GP     Lara MulchCooper, Nicolus Ose Lynn 06/10/2013, 11:39 AM  Verdell FaceKelly Becci Batty, PTA [phone] 06/10/2013

## 2013-06-10 NOTE — Progress Notes (Signed)
PATIENT ID: Shannon Montgomery   2 Days Post-Op Procedure(s) (LRB): OPEN REDUCTION INTERNAL FIXATION (ORIF) ANKLE FRACTURE (Right) IRRIGATION AND DEBRIDEMENT EXTREMITY/RIGHT ANKLE (Right)  Subjective: Patient reports doing well this am, trying to ween of pain medications. No complaints or concerns.   Objective:  Filed Vitals:   06/10/13 0500  BP: 140/59  Pulse: 83  Temp: 97.8 F (36.6 C)  Resp: 18     Awake, alert orientated Splint R LE c/d/i Wiggles toes, minimal distal swelling Calf soft, nontender   Labs:   Recent Labs  06/08/13 1100 06/09/13 0305  HGB 13.4 11.4*   Recent Labs  06/08/13 1100 06/09/13 0305  WBC 11.6* 7.0  RBC 4.68 4.05  HCT 39.8 34.7*  PLT 320 253   Recent Labs  06/08/13 1100  NA 138  K 3.9  CL 97  CO2 29  BUN 16  CREATININE 0.78  GLUCOSE 116*  CALCIUM 9.6    Assessment and Plan: 2 days s/p I & D ORIF R open ankle fx Cont splint x 2 weeks Will be finishing 48 hrs abx today PT rec HH and will order that today Okay to d/c today when done with abx Fu with Dr. Ave Filterhandler in 2 weeks  Percocet for home pain control  VTE proph: ASA 325mg  BID, SCD

## 2013-06-10 NOTE — Progress Notes (Signed)
Pt d/c home, verbalized understanding of d/c instructions and Rx. IV removed. Pt d/c via w/c in a stable condition.

## 2013-06-10 NOTE — Discharge Summary (Signed)
Patient ID: Shannon Montgomery MRN: [id number] DOB/AGE: [birth year] 79 y.o.  Admit date: 06/08/2013 Discharge date: 06/10/2013  Admission Diagnoses:  Principal Problem:   Open right ankle fracture Active Problems:   Ankle fracture   Discharge Diagnoses:  Same  Past Medical History  Diagnosis Date  . Closed right ankle fracture   . Arthritis   . Fibromyalgia   . PMR (polymyalgia rheumatica)   . Cataract     Surgeries: Procedure(s): OPEN REDUCTION INTERNAL FIXATION (ORIF) ANKLE FRACTURE IRRIGATION AND DEBRIDEMENT EXTREMITY/RIGHT ANKLE on 06/08/2013   Consultants: Treatment Team:  Mable ParisJustin William Chandler, MD  Discharged Condition: Improved  Hospital Course: Shannon Montgomery is an 79 y.o. female who was admitted 06/08/2013 for operative treatment ofOpen right ankle fracture. Patient fell at house on 06/08/13 and sustained a right open ankle bimalleolar fracture. After pre-op clearance the patient was taken to the operating room on 06/08/2013 and underwent  Procedure(s): OPEN REDUCTION INTERNAL FIXATION (ORIF) ANKLE FRACTURE IRRIGATION AND DEBRIDEMENT EXTREMITY/RIGHT ANKLE.    Patient was given perioperative antibiotics: Anti-infectives   Start     Dose/Rate Route Frequency Ordered Stop   06/09/13 0200  ceFAZolin (ANCEF) IVPB 1 g/50 mL premix     1 g 100 mL/hr over 30 Minutes Intravenous Every 8 hours 06/08/13 1929 06/11/13 0159   06/08/13 1100  Ampicillin-Sulbactam (UNASYN) 3 g in sodium chloride 0.9 % 100 mL IVPB     3 g 100 mL/hr over 60 Minutes Intravenous  Once 06/08/13 1055 06/08/13 1240       Patient was given sequential compression devices, early ambulation, and ASA 325mg  BID to prevent DVT.  Patient benefited maximally from hospital stay and there were no complications.  She was kept 2 nights to allow for 48 hrs of antibiotic treatment.   Recent vital signs: Patient Vitals for the past 24 hrs:  BP Temp Temp src Pulse Resp SpO2  06/10/13 0500 140/59 mmHg 97.8 F (36.6  C) Oral 83 18 98 %  06/09/13 2200 137/62 mmHg 97.7 F (36.5 C) - 95 18 95 %     Recent laboratory studies:  Recent Labs  06/08/13 1100 06/09/13 0305  WBC 11.6* 7.0  HGB 13.4 11.4*  HCT 39.8 34.7*  PLT 320 253  NA 138  --   K 3.9  --   CL 97  --   CO2 29  --   BUN 16  --   CREATININE 0.78  --   GLUCOSE 116*  --   CALCIUM 9.6  --      Discharge Medications:     Medication List         aspirin EC 81 MG tablet  Take 81 mg by mouth daily.     Calcium-Magnesium-Zinc 333-133-5 MG Tabs  Take 1 tablet by mouth daily.     Cholecalciferol 2000 UNITS Caps  Take 1 capsule by mouth daily.     co-enzyme Q-10 30 MG capsule  Take 30 mg by mouth daily.     CYANOCOBALAMIN PO  Take 1 tablet by mouth daily.     esomeprazole 20 MG capsule  Commonly known as:  NEXIUM  Take 20 mg by mouth daily at 12 noon.     etodolac 400 MG tablet  Commonly known as:  LODINE  Take 400 mg by mouth daily.     KRILL OIL ULTRA STRENGTH PO  Take 1 capsule by mouth daily.     multivitamin with minerals Tabs tablet  Take 1 tablet by  mouth daily.     niacin 50 MG tablet  Take 50 mg by mouth at bedtime.     oxyCODONE-acetaminophen 5-325 MG per tablet  Commonly known as:  ROXICET  Take 1-2 tablets by mouth every 4 (four) hours as needed for severe pain.     predniSONE 1 MG tablet  Commonly known as:  DELTASONE  Take 4 mg by mouth daily with breakfast. On taper changes to 3 mg on Sunday 1/18.     traMADol 50 MG tablet  Commonly known as:  ULTRAM  Take 50 mg by mouth 2 (two) times daily.     TRUNATURE DIGESTIVE PROBIOTIC Caps  Take 1 capsule by mouth daily.     vitamin C 500 MG tablet  Commonly known as:  ASCORBIC ACID  Take 500 mg by mouth daily.        Diagnostic Studies: Dg Ankle 2 Views Left  06/08/2013   CLINICAL DATA:  79 year old female status post fall with pain. Initial encounter.  EXAM: LEFT ANKLE - 2 VIEW  COMPARISON:  None.  FINDINGS: Severe fracture dislocation at the  right ankle. Oblique comminuted fractures of the distal right fibula, and medial malleolus with posterior and lateral mortise joint dislocation. Probable more nondisplaced fracture of the posterior malleolus. Associated soft tissue swelling. Cannot exclude trace subcutaneous gas anteriorly (arrow). The talus and calcaneus appear to remain intact.  IMPRESSION: 1. Comminuted right mortise joint fracture dislocation. Suspect a try malleolar fracture. 2. Cannot exclude trace subcutaneous gas anteriorly, such as due to open fracture. Clinical correlation recommended.   Electronically Signed   By: Augusto Gamble M.D.   On: 06/08/2013 10:18   Dg Ankle 2 Views Right  06/08/2013   CLINICAL DATA:  Post reduction.  EXAM: RIGHT ANKLE - 2 VIEW  COMPARISON:  Ankle series 114 2015.  FINDINGS: Patient status post reduction fracture dislocation right ankle. There is slight lateral displacement of the mild lateral malleolar fracture component. Medial malleolar fracture component is slightly medially displaced. Right ankle is relocated.  IMPRESSION: Status post reduction fracture dislocation right ankle.   Electronically Signed   By: Maisie Fus  Register   On: 06/08/2013 13:35   Dg Ankle Complete Right  06/08/2013   CLINICAL DATA:  Right ankle fracture fixation.  EXAM: DG C-ARM 1-60 MIN; RIGHT ANKLE - COMPLETE 3+ VIEW  COMPARISON:  Earlier films, same date.  FINDINGS: There are plate and screws transfixing the fibular fracture and 2 malleolar screws transfixing the medial malleolus fracture. Good position and alignment without complicating features.  IMPRESSION: Internal fixation of the bilateral ankle fractures with anatomic reduction and alignment.   Electronically Signed   By: Loralie Champagne M.D.   On: 06/08/2013 19:59   Dg Knee Right Port  06/08/2013   CLINICAL DATA:  79 year old female with knee pain after injury. Severe right ankle fracture. Initial encounter.  EXAM: PORTABLE RIGHT KNEE - 1-2 VIEW  COMPARISON:  Right ankle  series from the same day reported separately.  FINDINGS: Portable AP and cross-table lateral views of the right knee. Bone mineralization is within normal limits for age. Patella intact. No joint effusion identified. Lateral compartment joint space loss and subchondral sclerosis. No acute fracture identified.  IMPRESSION: No acute fracture or dislocation identified about the right knee.   Electronically Signed   By: Augusto Gamble M.D.   On: 06/08/2013 11:58   Dg C-arm 1-60 Min  06/08/2013   CLINICAL DATA:  Right ankle fracture fixation.  EXAM: DG C-ARM 1-60  MIN; RIGHT ANKLE - COMPLETE 3+ VIEW  COMPARISON:  Earlier films, same date.  FINDINGS: There are plate and screws transfixing the fibular fracture and 2 malleolar screws transfixing the medial malleolus fracture. Good position and alignment without complicating features.  IMPRESSION: Internal fixation of the bilateral ankle fractures with anatomic reduction and alignment.   Electronically Signed   By: Loralie Champagne M.D.   On: 06/08/2013 19:59    Disposition: 01-Home or Self Care      Discharge Orders   Future Orders Complete By Expires   Call MD / Call 911  As directed    Comments:     If you experience chest pain or shortness of breath, CALL 911 and be transported to the hospital emergency room.  If you develope a fever above 101 F, pus (white drainage) or increased drainage or redness at the wound, or calf pain, call your surgeon's office.   Constipation Prevention  As directed    Comments:     Drink plenty of fluids.  Prune juice may be helpful.  You may use a stool softener, such as Colace (over the counter) 100 mg twice a day.  Use MiraLax (over the counter) for constipation as needed.   Diet - low sodium heart healthy  As directed    Increase activity slowly as tolerated  As directed       Follow-up Information   Follow up with Mable Paris, MD. Schedule an appointment as soon as possible for a visit in 2 weeks.    Specialty:  Orthopedic Surgery   Contact information:   [address] [place] [address] [phone]        Signed: Jiles Harold 06/10/2013, 2:14 PM

## 2013-06-10 NOTE — Care Management Note (Signed)
CARE MANAGEMENT NOTE 06/10/2013  Patient:  Shannon Montgomery,Shannon Montgomery   Account Number:  [id number]  Date Initiated:  06/10/2013  Documentation initiated by:  Vance PeperBRADY,Karrigan Messamore  Subjective/Objective Assessment:   79 yr old female s/p right ankle ORIF     Action/Plan:   Case Manager contacted patient to arrange Home Health. Choice offered. CM contacted Ames DuraMary Hickling,RN [facility] liasion. Patient states she has walker and Montgomery wheelchair.   Anticipated DC Date:  06/10/2013   Anticipated DC Plan:  HOME W HOME HEALTH SERVICES      DC Planning Services  CM consult      [facility]   Choice offered to / List presented to:  C-1 Patient        HH arranged  HH-2 PT      [facility] agency  [facility]   Status of service:  Completed, signed off Medicare Important Message given?   (If response is "NO", the following Medicare IM given date fields will be blank) Date Medicare IM given:   Date Additional Medicare IM given:    Discharge Disposition:  HOME W HOME HEALTH SERVICES

## 2014-04-25 HISTORY — PX: ANKLE HARDWARE REMOVAL: SHX1149

## 2015-12-05 ENCOUNTER — Other Ambulatory Visit: Payer: Self-pay | Admitting: Orthopaedic Surgery

## 2015-12-19 ENCOUNTER — Ambulatory Visit (HOSPITAL_COMMUNITY)
Admission: RE | Admit: 2015-12-19 | Discharge: 2015-12-19 | Disposition: A | Discharge status: Home / Self Care | Payer: Medicare Other | Source: Ambulatory Visit | Attending: Orthopaedic Surgery | Admitting: Orthopaedic Surgery

## 2015-12-19 ENCOUNTER — Encounter (HOSPITAL_COMMUNITY)
Admission: RE | Admit: 2015-12-19 | Discharge: 2015-12-19 | Disposition: A | Discharge status: Home / Self Care | Payer: Medicare Other | Source: Ambulatory Visit | Attending: Orthopaedic Surgery | Admitting: Orthopaedic Surgery

## 2015-12-19 ENCOUNTER — Encounter (HOSPITAL_COMMUNITY): Payer: Self-pay

## 2015-12-19 DIAGNOSIS — Z01818 Encounter for other preprocedural examination: Secondary | ICD-10-CM | POA: Insufficient documentation

## 2015-12-19 DIAGNOSIS — Z01812 Encounter for preprocedural laboratory examination: Secondary | ICD-10-CM | POA: Diagnosis not present

## 2015-12-19 DIAGNOSIS — Z0183 Encounter for blood typing: Secondary | ICD-10-CM | POA: Diagnosis not present

## 2015-12-19 DIAGNOSIS — M1711 Unilateral primary osteoarthritis, right knee: Secondary | ICD-10-CM | POA: Diagnosis not present

## 2015-12-19 HISTORY — DX: Unspecified osteoarthritis, unspecified site: M19.90

## 2015-12-19 HISTORY — DX: Gastro-esophageal reflux disease without esophagitis: K21.9

## 2015-12-19 HISTORY — DX: Presence of spectacles and contact lenses: Z97.3

## 2015-12-19 LAB — CBC WITH DIFFERENTIAL/PLATELET
BASOS ABS: 0 10*3/uL (ref 0.0–0.1)
Basophils Relative: 0 %
Eosinophils Absolute: 0 10*3/uL (ref 0.0–0.7)
Eosinophils Relative: 0 %
HEMATOCRIT: 35.4 % — AB (ref 36.0–46.0)
Hemoglobin: 11.7 g/dL — ABNORMAL LOW (ref 12.0–15.0)
LYMPHS PCT: 13 %
Lymphs Abs: 1.2 10*3/uL (ref 0.7–4.0)
MCH: 27.6 pg (ref 26.0–34.0)
MCHC: 33.1 g/dL (ref 30.0–36.0)
MCV: 83.5 fL (ref 78.0–100.0)
Monocytes Absolute: 0.6 10*3/uL (ref 0.1–1.0)
Monocytes Relative: 6 %
NEUTROS ABS: 7.5 10*3/uL (ref 1.7–7.7)
Neutrophils Relative %: 81 %
PLATELETS: 358 10*3/uL (ref 150–400)
RBC: 4.24 MIL/uL (ref 3.87–5.11)
RDW: 14.4 % (ref 11.5–15.5)
WBC: 9.3 10*3/uL (ref 4.0–10.5)

## 2015-12-19 LAB — URINALYSIS, ROUTINE W REFLEX MICROSCOPIC
BILIRUBIN URINE: NEGATIVE
GLUCOSE, UA: NEGATIVE mg/dL
HGB URINE DIPSTICK: NEGATIVE
Ketones, ur: NEGATIVE mg/dL
Leukocytes, UA: NEGATIVE
Nitrite: NEGATIVE
Protein, ur: NEGATIVE mg/dL
SPECIFIC GRAVITY, URINE: 1.009 (ref 1.005–1.030)
pH: 6.5 (ref 5.0–8.0)

## 2015-12-19 LAB — APTT: APTT: 34 s (ref 24–36)

## 2015-12-19 LAB — TYPE AND SCREEN
ABO/RH(D): O POS
ANTIBODY SCREEN: NEGATIVE

## 2015-12-19 LAB — BASIC METABOLIC PANEL
ANION GAP: 9 (ref 5–15)
BUN: 5 mg/dL — ABNORMAL LOW (ref 6–20)
CO2: 26 mmol/L (ref 22–32)
Calcium: 8.8 mg/dL — ABNORMAL LOW (ref 8.9–10.3)
Chloride: 95 mmol/L — ABNORMAL LOW (ref 101–111)
Creatinine, Ser: 0.77 mg/dL (ref 0.44–1.00)
Glucose, Bld: 107 mg/dL — ABNORMAL HIGH (ref 65–99)
POTASSIUM: 3.5 mmol/L (ref 3.5–5.1)
SODIUM: 130 mmol/L — AB (ref 135–145)

## 2015-12-19 LAB — SURGICAL PCR SCREEN
MRSA, PCR: NEGATIVE
Staphylococcus aureus: NEGATIVE

## 2015-12-19 LAB — PROTIME-INR
INR: 1.03
Prothrombin Time: 13.5 seconds (ref 11.4–15.2)

## 2015-12-19 LAB — ABO/RH: ABO/RH(D): O POS

## 2015-12-19 NOTE — Progress Notes (Signed)
PCP - Dr. Pearson Grippe Cardiologist - denies  EKG - 12/19/15 CXR - 12/19/15  Echo/stress test/cardiac cath - pt. Denies  Patient denies chest pain and shortness of breath at PAT appointment.

## 2015-12-19 NOTE — Pre-Procedure Instructions (Signed)
Gypsy Lore  12/19/2015      [facility] - [place], [place] - [address] AT [facility] OF [address] [place] [address] Phone: [phone] Fax: [phone]    Your procedure is scheduled on Tuesday, August 8th, 2017.  Report to [facility] Admitting at 8:15 A.M.   Call this number if you have problems the morning of surgery:  [phone]   Remember:  Do not eat food or drink liquids after midnight.   Take these medicines the morning of surgery with A SIP OF WATER: Acetaminophen (Tylenol) if needed, Esomeprazole (Nexium), Metoclopramide (Reglan) if needed, Tramadol (Ultram) if needed, eye drops.  7 days prior to surgery, stop taking: Aspirin, NSAIDS, Celecoxib (Celebrex), Aleve, Naproxen, Ibuprofen, Advil, Motrin, BC's, Goody's, Fish oil, all herbal medications, and all vitamins.    Do not wear jewelry, make-up or nail polish.  Do not wear lotions, powders, or perfumes.    Do not shave 48 hours prior to surgery.    Do not bring valuables to the hospital.  [facility] is not responsible for any belongings or valuables.  Contacts, dentures or bridgework may not be worn into surgery.  Leave your suitcase in the car.  After surgery it may be brought to your room.  For patients admitted to the hospital, discharge time will be determined by your treatment team.  Patients discharged the day of surgery will not be allowed to drive home.   Special instructions:  Preparing for Surgery.   Please read over the following fact sheets that you were given. MRSA Information, Incentive Spirometry    [place]- Preparing For Surgery  Before surgery, you can play an important role. Because skin is not sterile, your skin needs to be as free of germs as possible. You can reduce the number of germs on your skin by washing with CHG (chlorahexidine gluconate) Soap before surgery.  CHG is an antiseptic cleaner which kills germs and  bonds with the skin to continue killing germs even after washing.  Please do not use if you have an allergy to CHG or antibacterial soaps. If your skin becomes reddened/irritated stop using the CHG.  Do not shave (including legs and underarms) for at least 48 hours prior to first CHG shower. It is OK to shave your face.  Please follow these instructions carefully.   1. Shower the NIGHT BEFORE SURGERY and the MORNING OF SURGERY with CHG.   2. If you chose to wash your hair, wash your hair first as usual with your normal shampoo.  3. After you shampoo, rinse your hair and body thoroughly to remove the shampoo.  4. Use CHG as you would any other liquid soap. You can apply CHG directly to the skin and wash gently with a scrungie or a clean washcloth.   5. Apply the CHG Soap to your body ONLY FROM THE NECK DOWN.  Do not use on open wounds or open sores. Avoid contact with your eyes, ears, mouth and genitals (private parts). Wash genitals (private parts) with your normal soap.  6. Wash thoroughly, paying special attention to the area where your surgery will be performed.  7. Thoroughly rinse your body with warm water from the neck down.  8. DO NOT shower/wash with your normal soap after using and rinsing off the CHG Soap.  9. Pat yourself dry with a CLEAN TOWEL.   10. Wear CLEAN PAJAMAS   11.  Place CLEAN SHEETS on your bed the night of your first shower and DO NOT SLEEP WITH PETS.  Day of Surgery: Do not apply any deodorants/lotions. Please wear clean clothes to the hospital/surgery center.

## 2015-12-20 ENCOUNTER — Encounter (HOSPITAL_COMMUNITY): Payer: Self-pay | Admitting: Vascular Surgery

## 2015-12-21 NOTE — Progress Notes (Signed)
Anesthesia Chart Review: Patient is a 81 year old female scheduled for right TKA on 01/01/16 by Dr. Jerl Santos.  History includes nonsmoker, polymyalgia rheumatica, fibromyalgia, GERD, osteoarthritis, corneal transplant, ORIF right ankle fracture '15. BMI is 32, consistent with obesity.   PCP is Dr. Pearson Grippe.  Meds include aspirin 81 mg, Nexium, krill oil, Reglan, tramadol, probiotic, MVI, fluorometholone ophthalmic.  PAT Vitals: BP 147/80, HR 102, RR 20, T 36.4C, O2 sat 98%. WT 82 Kg.  12/19/15 EKG: NSR, LAD, minimal voltage criteria for LVH, may be normal variant.  12/19/15 CXR: IMPRESSION: Elevation of the right hemidiaphragm with right basilar atelectasis or scarring. Small right pleural effusion. Aortic atherosclerosis.  Preoperative labs noted. Na 130, Ca 8.8, glucose 107, Cr 0.77. H/H 11.7/35.4. PT/PTT, UA WNL.   NA and CXR results called to [facility] at Dr. Nolon Nations office. If no acute symptoms then defer additional recommendations to surgery. Patient denied chest pain and SOB at PAT. If no acute changes then I would anticipate that she could proceed as planned.  Chart reviewed with anesthesiologist Dr. Glade Stanford.  Velna [facility] Short Stay Center/Anesthesiology Phone [phone] 12/21/2015 4:44 PM

## 2015-12-24 NOTE — H&P (Signed)
TOTAL KNEE ADMISSION H&P  Patient is being admitted for right total knee arthroplasty.  Subjective:  Chief Complaint:right knee pain.  HPI: Shannon Montgomery, 81 y.o. female, has a history of pain and functional disability in the right knee due to arthritis and has failed non-surgical conservative treatments for greater than 12 weeks to includeNSAID's and/or analgesics, corticosteriod injections, viscosupplementation injections, flexibility and strengthening excercises, use of assistive devices, weight reduction as appropriate and activity modification.  Onset of symptoms was gradual, starting 5 years ago with gradually worsening course since that time. The patient noted no past surgery on the right knee(s).  Patient currently rates pain in the right knee(s) at 10 out of 10 with activity. Patient has night pain, worsening of pain with activity and weight bearing, pain that interferes with activities of daily living, crepitus and joint swelling.  Patient has evidence of subchondral cysts, subchondral sclerosis, periarticular osteophytes and joint space narrowing by imaging studies.  There is no active infection.  Patient Active Problem List   Diagnosis Date Noted  . Ankle fracture 06/08/2013  . Open right ankle fracture 06/08/2013  . VITAMIN D DEFICIENCY 07/15/2007  . HYPERTENSION, ESSENTIAL NOS 07/15/2007  . GERD 07/15/2007  . OTHER MALAISE AND FATIGUE 07/15/2007  . URI 02/12/2007   Past Medical History:  Diagnosis Date  . Arthritis   . Cataract   . Closed right ankle fracture   . Fibromyalgia   . GERD (gastroesophageal reflux disease)   . Osteoarthritis   . PMR (polymyalgia rheumatica) (HCC)   . Wears glasses     Past Surgical History:  Procedure Laterality Date  . cornea transplants    . CYSTECTOMY     right ankle  . EYE SURGERY     x3  . I&D EXTREMITY Right 06/08/2013   Procedure: IRRIGATION AND DEBRIDEMENT EXTREMITY/RIGHT ANKLE;  Surgeon: Mable Paris, MD;   Location: [facility] OR;  Service: Orthopedics;  Laterality: Right;  . ORIF ANKLE FRACTURE Right 06/08/2013   Procedure: OPEN REDUCTION INTERNAL FIXATION (ORIF) ANKLE FRACTURE;  Surgeon: Mable Paris, MD;  Location: [facility] OR;  Service: Orthopedics;  Laterality: Right;    No prescriptions prior to admission.   Allergies  Allergen Reactions  . Doxycycline Nausea Only  . Erythromycin Nausea Only  . Hepatitis A Vaccine Swelling    Social History  Substance Use Topics  . Smoking status: Never Smoker  . Smokeless tobacco: Never Used  . Alcohol use No    No family history on file.   Review of Systems  Musculoskeletal: Positive for joint pain.       Right knee    Objective:  Physical Exam  Constitutional: She is oriented to person, place, and time. She appears well-developed and well-nourished.  HENT:  Head: Normocephalic and atraumatic.  Eyes: Pupils are equal, round, and reactive to light.  Neck: Normal range of motion.  Cardiovascular: Normal rate and regular rhythm.   Respiratory: Effort normal.  GI: Soft.  Musculoskeletal:  Right knee has moderate valgus deformity.  She has no effusion with no scars.  Her range of motion is 0-110.  At her right ankle she has scars medial and lateral with some prominent hardware but fairly good motion and minimal pain to palpation.  Sensation and motor function are intact in her feet with palpable pulses on both sides.  She has good hip motion on both sides.   Neurological: She is alert and oriented to person, place, and time.  Skin: Skin is warm  and dry.  Psychiatric: She has a normal mood and affect. Her behavior is normal. Judgment and thought content normal.    Vital signs in last 24 hours:    Labs:   Estimated body mass index is 32.13 kg/m as calculated from the following:   Height as of 12/19/15:  (1.6 m).   Weight as of 12/19/15: 82.3 kg (181 lb 6.4 oz).   Imaging Review Plain radiographs demonstrate severe degenerative  joint disease of the right knee(s). The overall alignment isneutral. The bone quality appears to be good for age and reported activity level.  Assessment/Plan:  End stage primary arthritis, right knee   The patient history, physical examination, clinical judgment of the provider and imaging studies are consistent with end stage degenerative joint disease of the right knee(s) and total knee arthroplasty is deemed medically necessary. The treatment options including medical management, injection therapy arthroscopy and arthroplasty were discussed at length. The risks and benefits of total knee arthroplasty were presented and reviewed. The risks due to aseptic loosening, infection, stiffness, patella tracking problems, thromboembolic complications and other imponderables were discussed. The patient acknowledged the explanation, agreed to proceed with the plan and consent was signed. Patient is being admitted for inpatient treatment for surgery, pain control, PT, OT, prophylactic antibiotics, VTE prophylaxis, progressive ambulation and ADL's and discharge planning. The patient is planning to be discharged home with home health services

## 2015-12-27 ENCOUNTER — Other Ambulatory Visit (HOSPITAL_COMMUNITY): Payer: Self-pay | Admitting: Internal Medicine

## 2015-12-27 ENCOUNTER — Ambulatory Visit (HOSPITAL_COMMUNITY)
Admission: RE | Admit: 2015-12-27 | Discharge: 2015-12-27 | Disposition: A | Discharge status: Home / Self Care | Payer: Medicare Other | Source: Ambulatory Visit | Attending: Internal Medicine | Admitting: Internal Medicine

## 2015-12-27 DIAGNOSIS — I700 Atherosclerosis of aorta: Secondary | ICD-10-CM | POA: Insufficient documentation

## 2015-12-27 DIAGNOSIS — J90 Pleural effusion, not elsewhere classified: Secondary | ICD-10-CM | POA: Insufficient documentation

## 2015-12-27 DIAGNOSIS — R918 Other nonspecific abnormal finding of lung field: Secondary | ICD-10-CM | POA: Insufficient documentation

## 2015-12-27 MED ORDER — IOPAMIDOL (ISOVUE-300) INJECTION 61%
INTRAVENOUS | Status: AC
  Administered 2015-12-27: 75 mL
  Filled 2015-12-27: qty 75

## 2015-12-31 ENCOUNTER — Other Ambulatory Visit: Payer: Self-pay | Admitting: Internal Medicine

## 2015-12-31 DIAGNOSIS — R160 Hepatomegaly, not elsewhere classified: Secondary | ICD-10-CM

## 2016-01-01 ENCOUNTER — Inpatient Hospital Stay (HOSPITAL_COMMUNITY): Admission: RE | Admit: 2016-01-01 | Payer: Medicare Other | Source: Ambulatory Visit | Admitting: Orthopaedic Surgery

## 2016-01-01 ENCOUNTER — Encounter (HOSPITAL_COMMUNITY): Admission: RE | Payer: Self-pay | Source: Ambulatory Visit

## 2016-01-01 SURGERY — ARTHROPLASTY, KNEE, TOTAL
Anesthesia: Choice | Laterality: Right

## 2016-01-08 ENCOUNTER — Other Ambulatory Visit: Payer: Medicare Other

## 2016-01-20 ENCOUNTER — Emergency Department (HOSPITAL_COMMUNITY): Payer: Medicare Other

## 2016-01-20 ENCOUNTER — Inpatient Hospital Stay (HOSPITAL_COMMUNITY)
Admission: EM | Admit: 2016-01-20 | Discharge: 2016-01-25 | DRG: 371 | Disposition: A | Discharge status: Home Health | Payer: Medicare Other | Attending: Family Medicine | Admitting: Family Medicine

## 2016-01-20 ENCOUNTER — Observation Stay (HOSPITAL_COMMUNITY): Payer: Medicare Other

## 2016-01-20 ENCOUNTER — Encounter (HOSPITAL_COMMUNITY): Payer: Self-pay

## 2016-01-20 DIAGNOSIS — R296 Repeated falls: Secondary | ICD-10-CM | POA: Diagnosis present

## 2016-01-20 DIAGNOSIS — Z6830 Body mass index (BMI) 30.0-30.9, adult: Secondary | ICD-10-CM

## 2016-01-20 DIAGNOSIS — J189 Pneumonia, unspecified organism: Secondary | ICD-10-CM | POA: Diagnosis not present

## 2016-01-20 DIAGNOSIS — Z7982 Long term (current) use of aspirin: Secondary | ICD-10-CM | POA: Diagnosis not present

## 2016-01-20 DIAGNOSIS — K769 Liver disease, unspecified: Secondary | ICD-10-CM

## 2016-01-20 DIAGNOSIS — E876 Hypokalemia: Secondary | ICD-10-CM | POA: Diagnosis not present

## 2016-01-20 DIAGNOSIS — K6819 Other retroperitoneal abscess: Principal | ICD-10-CM | POA: Diagnosis present

## 2016-01-20 DIAGNOSIS — Z79891 Long term (current) use of opiate analgesic: Secondary | ICD-10-CM

## 2016-01-20 DIAGNOSIS — D72829 Elevated white blood cell count, unspecified: Secondary | ICD-10-CM | POA: Diagnosis not present

## 2016-01-20 DIAGNOSIS — J90 Pleural effusion, not elsewhere classified: Secondary | ICD-10-CM

## 2016-01-20 DIAGNOSIS — M1990 Unspecified osteoarthritis, unspecified site: Secondary | ICD-10-CM | POA: Diagnosis present

## 2016-01-20 DIAGNOSIS — E663 Overweight: Secondary | ICD-10-CM | POA: Diagnosis present

## 2016-01-20 DIAGNOSIS — E871 Hypo-osmolality and hyponatremia: Secondary | ICD-10-CM | POA: Diagnosis present

## 2016-01-20 DIAGNOSIS — M353 Polymyalgia rheumatica: Secondary | ICD-10-CM | POA: Diagnosis present

## 2016-01-20 DIAGNOSIS — K5900 Constipation, unspecified: Secondary | ICD-10-CM | POA: Diagnosis present

## 2016-01-20 DIAGNOSIS — K209 Esophagitis, unspecified without bleeding: Secondary | ICD-10-CM | POA: Diagnosis present

## 2016-01-20 DIAGNOSIS — B954 Other streptococcus as the cause of diseases classified elsewhere: Secondary | ICD-10-CM | POA: Diagnosis present

## 2016-01-20 DIAGNOSIS — Z79899 Other long term (current) drug therapy: Secondary | ICD-10-CM | POA: Diagnosis not present

## 2016-01-20 DIAGNOSIS — K750 Abscess of liver: Secondary | ICD-10-CM | POA: Diagnosis present

## 2016-01-20 DIAGNOSIS — E559 Vitamin D deficiency, unspecified: Secondary | ICD-10-CM | POA: Diagnosis present

## 2016-01-20 DIAGNOSIS — B966 Bacteroides fragilis [B. fragilis] as the cause of diseases classified elsewhere: Secondary | ICD-10-CM | POA: Diagnosis present

## 2016-01-20 DIAGNOSIS — J948 Other specified pleural conditions: Secondary | ICD-10-CM | POA: Diagnosis not present

## 2016-01-20 DIAGNOSIS — K650 Generalized (acute) peritonitis: Secondary | ICD-10-CM

## 2016-01-20 DIAGNOSIS — K210 Gastro-esophageal reflux disease with esophagitis: Secondary | ICD-10-CM | POA: Diagnosis present

## 2016-01-20 DIAGNOSIS — I10 Essential (primary) hypertension: Secondary | ICD-10-CM | POA: Diagnosis present

## 2016-01-20 DIAGNOSIS — M797 Fibromyalgia: Secondary | ICD-10-CM | POA: Diagnosis present

## 2016-01-20 DIAGNOSIS — K7689 Other specified diseases of liver: Secondary | ICD-10-CM | POA: Diagnosis not present

## 2016-01-20 HISTORY — DX: Pleural effusion, not elsewhere classified: J90

## 2016-01-20 HISTORY — DX: Elevated white blood cell count, unspecified: D72.829

## 2016-01-20 HISTORY — DX: Liver disease, unspecified: K76.9

## 2016-01-20 HISTORY — DX: Hypo-osmolality and hyponatremia: E87.1

## 2016-01-20 LAB — NA AND K (SODIUM & POTASSIUM), RAND UR
POTASSIUM UR: 44 mmol/L
Sodium, Ur: 41 mmol/L

## 2016-01-20 LAB — CBC WITH DIFFERENTIAL/PLATELET
BASOS PCT: 0 %
Basophils Absolute: 0 10*3/uL (ref 0.0–0.1)
EOS ABS: 0 10*3/uL (ref 0.0–0.7)
Eosinophils Relative: 0 %
HCT: 28.7 % — ABNORMAL LOW (ref 36.0–46.0)
HEMOGLOBIN: 9.5 g/dL — AB (ref 12.0–15.0)
LYMPHS PCT: 2 %
Lymphs Abs: 0.5 10*3/uL — ABNORMAL LOW (ref 0.7–4.0)
MCH: 26.4 pg (ref 26.0–34.0)
MCHC: 33.1 g/dL (ref 30.0–36.0)
MCV: 79.7 fL (ref 78.0–100.0)
MONO ABS: 1.1 10*3/uL — AB (ref 0.1–1.0)
Monocytes Relative: 4 %
NEUTROS PCT: 94 %
Neutro Abs: 25.4 10*3/uL — ABNORMAL HIGH (ref 1.7–7.7)
PLATELETS: 533 10*3/uL — AB (ref 150–400)
RBC: 3.6 MIL/uL — AB (ref 3.87–5.11)
RDW: 14.1 % (ref 11.5–15.5)
WBC: 27 10*3/uL — AB (ref 4.0–10.5)

## 2016-01-20 LAB — LACTIC ACID, PLASMA
LACTIC ACID, VENOUS: 1.7 mmol/L (ref 0.5–1.9)
Lactic Acid, Venous: 1.4 mmol/L (ref 0.5–1.9)

## 2016-01-20 LAB — BASIC METABOLIC PANEL
Anion gap: 12 (ref 5–15)
Anion gap: 13 (ref 5–15)
BUN: 6 mg/dL (ref 6–20)
BUN: 6 mg/dL (ref 6–20)
CHLORIDE: 86 mmol/L — AB (ref 101–111)
CHLORIDE: 85 mmol/L — AB (ref 101–111)
CO2: 24 mmol/L (ref 22–32)
CO2: 22 mmol/L (ref 22–32)
CREATININE: 0.87 mg/dL (ref 0.44–1.00)
CREATININE: 0.76 mg/dL (ref 0.44–1.00)
Calcium: 7.9 mg/dL — ABNORMAL LOW (ref 8.9–10.3)
Calcium: 7.7 mg/dL — ABNORMAL LOW (ref 8.9–10.3)
Glucose, Bld: 140 mg/dL — ABNORMAL HIGH (ref 65–99)
Glucose, Bld: 144 mg/dL — ABNORMAL HIGH (ref 65–99)
POTASSIUM: 3.5 mmol/L (ref 3.5–5.1)
POTASSIUM: 3.3 mmol/L — AB (ref 3.5–5.1)
SODIUM: 122 mmol/L — AB (ref 135–145)
SODIUM: 120 mmol/L — AB (ref 135–145)

## 2016-01-20 LAB — CREATININE, URINE, RANDOM: Creatinine, Urine: 108.51 mg/dL

## 2016-01-20 LAB — URINALYSIS, ROUTINE W REFLEX MICROSCOPIC
Bilirubin Urine: NEGATIVE
GLUCOSE, UA: NEGATIVE mg/dL
HGB URINE DIPSTICK: NEGATIVE
KETONES UR: NEGATIVE mg/dL
Leukocytes, UA: NEGATIVE
Nitrite: NEGATIVE
PH: 6 (ref 5.0–8.0)
PROTEIN: NEGATIVE mg/dL
Specific Gravity, Urine: 1.011 (ref 1.005–1.030)

## 2016-01-20 LAB — COMPREHENSIVE METABOLIC PANEL
ALK PHOS: 185 U/L — AB (ref 38–126)
ALT: 19 U/L (ref 14–54)
ANION GAP: 13 (ref 5–15)
AST: 27 U/L (ref 15–41)
Albumin: 2.2 g/dL — ABNORMAL LOW (ref 3.5–5.0)
BILIRUBIN TOTAL: 0.9 mg/dL (ref 0.3–1.2)
BUN: 7 mg/dL (ref 6–20)
CALCIUM: 8.1 mg/dL — AB (ref 8.9–10.3)
CO2: 26 mmol/L (ref 22–32)
Chloride: 84 mmol/L — ABNORMAL LOW (ref 101–111)
Creatinine, Ser: 0.97 mg/dL (ref 0.44–1.00)
GFR calc Af Amer: >60 mL/min (ref >60)
GFR calc non Af Amer: 54 mL/min — ABNORMAL LOW (ref >60)
GLUCOSE: 132 mg/dL — AB (ref 65–99)
POTASSIUM: 3.5 mmol/L (ref 3.5–5.1)
SODIUM: 123 mmol/L — AB (ref 135–145)
TOTAL PROTEIN: 6 g/dL — AB (ref 6.5–8.1)

## 2016-01-20 LAB — I-STAT TROPONIN, ED: Troponin i, poc: 0.01 ng/mL (ref 0.00–0.08)

## 2016-01-20 LAB — I-STAT CG4 LACTIC ACID, ED: Lactic Acid, Venous: 1.93 mmol/L (ref 0.5–1.9)

## 2016-01-20 LAB — APTT: aPTT: 36 seconds (ref 24–36)

## 2016-01-20 LAB — PROTIME-INR
INR: 1.28
Prothrombin Time: 16 seconds — ABNORMAL HIGH (ref 11.4–15.2)

## 2016-01-20 MED ORDER — SODIUM CHLORIDE 0.9 % IV SOLN
INTRAVENOUS | Status: AC
  Administered 2016-01-20: 75 mL/h via INTRAVENOUS
  Administered 2016-01-21: 06:00:00 via INTRAVENOUS

## 2016-01-20 MED ORDER — VITAMIN D 1000 UNITS PO TABS
2000.0000 [IU] | ORAL_TABLET | Freq: Every day | ORAL | Status: DC
  Administered 2016-01-20 – 2016-01-25 (×5): 2000 [IU] via ORAL
  Filled 2016-01-20 (×6): qty 2

## 2016-01-20 MED ORDER — PIPERACILLIN-TAZOBACTAM 3.375 G IVPB
3.3750 g | Freq: Three times a day (TID) | INTRAVENOUS | Status: DC
  Administered 2016-01-20 – 2016-01-24 (×12): 3.375 g via INTRAVENOUS
  Filled 2016-01-20 (×16): qty 50

## 2016-01-20 MED ORDER — TRAMADOL HCL 50 MG PO TABS: 50.0000 mg | ORAL_TABLET | Freq: Two times a day (BID) | ORAL | Status: DC

## 2016-01-20 MED ORDER — IOPAMIDOL (ISOVUE-300) INJECTION 61%
INTRAVENOUS | Status: AC
  Filled 2016-01-20: qty 100

## 2016-01-20 MED ORDER — MAGNESIUM CITRATE PO SOLN: 1.0000 | Freq: Once | ORAL | Status: DC | PRN

## 2016-01-20 MED ORDER — SENNOSIDES-DOCUSATE SODIUM 8.6-50 MG PO TABS: 1.0000 | ORAL_TABLET | Freq: Every evening | ORAL | Status: DC | PRN

## 2016-01-20 MED ORDER — SODIUM CHLORIDE 0.9 % IV SOLN: INTRAVENOUS | Status: DC

## 2016-01-20 MED ORDER — ASPIRIN EC 81 MG PO TBEC
81.0000 mg | DELAYED_RELEASE_TABLET | Freq: Every day | ORAL | Status: DC
  Administered 2016-01-20 – 2016-01-25 (×5): 81 mg via ORAL
  Filled 2016-01-20 (×6): qty 1

## 2016-01-20 MED ORDER — TRAMADOL HCL 50 MG PO TABS
50.0000 mg | ORAL_TABLET | Freq: Two times a day (BID) | ORAL | Status: DC | PRN
Start: 2016-01-20 — End: 2016-01-25
  Administered 2016-01-20 – 2016-01-25 (×7): 50 mg via ORAL
  Filled 2016-01-20 (×8): qty 1

## 2016-01-20 MED ORDER — CHOLECALCIFEROL 50 MCG (2000 UT) PO CAPS: 1.0000 | ORAL_CAPSULE | Freq: Every day | ORAL | Status: DC

## 2016-01-20 MED ORDER — TRAZODONE HCL 50 MG PO TABS
25.0000 mg | ORAL_TABLET | Freq: Every evening | ORAL | Status: DC | PRN
  Administered 2016-01-22 – 2016-01-23 (×2): 25 mg via ORAL
  Filled 2016-01-20 (×2): qty 1

## 2016-01-20 MED ORDER — ACETAMINOPHEN 325 MG PO TABS
650.0000 mg | ORAL_TABLET | Freq: Four times a day (QID) | ORAL | Status: DC | PRN
  Administered 2016-01-21 – 2016-01-22 (×2): 650 mg via ORAL
  Filled 2016-01-20 (×3): qty 2

## 2016-01-20 MED ORDER — METOCLOPRAMIDE HCL 5 MG PO TABS
5.0000 mg | ORAL_TABLET | Freq: Three times a day (TID) | ORAL | Status: DC | PRN
Start: 2016-01-20 — End: 2016-01-25

## 2016-01-20 MED ORDER — CELECOXIB 200 MG PO CAPS
200.0000 mg | ORAL_CAPSULE | Freq: Two times a day (BID) | ORAL | Status: DC | PRN
  Administered 2016-01-21 – 2016-01-25 (×4): 200 mg via ORAL
  Filled 2016-01-20 (×5): qty 1

## 2016-01-20 MED ORDER — SODIUM CHLORIDE 0.9 % IV BOLUS (SEPSIS)
1000.0000 mL | Freq: Once | INTRAVENOUS | Status: AC
  Administered 2016-01-20: 1000 mL via INTRAVENOUS

## 2016-01-20 MED ORDER — ONDANSETRON HCL 4 MG/2ML IJ SOLN: 4.0000 mg | Freq: Four times a day (QID) | INTRAMUSCULAR | Status: DC | PRN

## 2016-01-20 MED ORDER — BISACODYL 10 MG RE SUPP: 10.0000 mg | Freq: Every day | RECTAL | Status: DC | PRN

## 2016-01-20 MED ORDER — ONDANSETRON HCL 4 MG PO TABS: 4.0000 mg | ORAL_TABLET | Freq: Four times a day (QID) | ORAL | Status: DC | PRN

## 2016-01-20 MED ORDER — VANCOMYCIN HCL IN DEXTROSE 750-5 MG/150ML-% IV SOLN
750.0000 mg | Freq: Two times a day (BID) | INTRAVENOUS | Status: DC
  Administered 2016-01-20 – 2016-01-21 (×3): 750 mg via INTRAVENOUS
  Filled 2016-01-20 (×4): qty 150

## 2016-01-20 MED ORDER — FLUOROMETHOLONE 0.1 % OP SUSP
1.0000 [drp] | Freq: Two times a day (BID) | OPHTHALMIC | Status: DC
  Administered 2016-01-20 – 2016-01-25 (×9): 1 [drp] via OPHTHALMIC
  Filled 2016-01-20: qty 5

## 2016-01-20 MED ORDER — TRUNATURE DIGESTIVE PROBIOTIC PO CAPS: 1.0000 | ORAL_CAPSULE | Freq: Every day | ORAL | Status: DC

## 2016-01-20 MED ORDER — PANTOPRAZOLE SODIUM 40 MG PO TBEC
40.0000 mg | DELAYED_RELEASE_TABLET | Freq: Every day | ORAL | Status: DC
  Administered 2016-01-20 – 2016-01-25 (×5): 40 mg via ORAL
  Filled 2016-01-20 (×6): qty 1

## 2016-01-20 MED ORDER — SACCHAROMYCES BOULARDII 250 MG PO CAPS
250.0000 mg | ORAL_CAPSULE | Freq: Every day | ORAL | Status: DC
  Administered 2016-01-20 – 2016-01-25 (×5): 250 mg via ORAL
  Filled 2016-01-20 (×6): qty 1

## 2016-01-20 NOTE — Progress Notes (Signed)
CT abdomen: Retroperitoneal fluid collection extends from the RIGHT iliac fossa to the RIGHT hepaticlobe margin consistent with large RETROPERITONEAL ABSCESS. There septation and gas collection in the most superior aspect adjacent to the liver with mass-effect on liver. Recommend ercutaneous drainage and cytology of the fluid collection RIGHT lateral to the liver. Also hepatic abscess  We will cont iv antibiotics, d/w interventional radiology Dr. Rica RecordsHassel who kindly agreed to evaluate for IR drainage. Cont close monitoring  Shamica Moree N

## 2016-01-20 NOTE — ED Notes (Signed)
Pt given water , admitting MD at bedside. Attempted report.

## 2016-01-20 NOTE — ED Notes (Signed)
Patient transported to X-ray 

## 2016-01-20 NOTE — H&P (Signed)
History and Physical    Shannon Montgomery:[id number] DOB: 04-01-1935 DOA: 01/20/2016   PCP: Pearson Grippe, MD   Patient coming from:  Home     Chief Complaint: Frequent Falls  HPI: Shannon Montgomery is a 81 y.o. female with medical history significant for posterior arthritis, polymyalgia rheumatica, fibromyalgia, GERD, and chronic hyponatremia, baseline 1:30, followed by Dr. Selena Batten as outpatient, present in to the emergency Department with recurrent falls. The last one was 1 hour prior to admission.   She denies any dizziness, but she has occasional nausea without vomiting, she denies any seizures, or confusion. The patient denies any changes in her medications.She was treated with Cipro about 2 weeks ago for possible URI. No recent She reports chills and night sweats, but no fevers.trips. She denies any shortness of breath or chest pain. She has intermittent, right upper quadrant pain over the last 6 months. She denies any dysuria or gross hematuria. She denies any urinary retention or frequency or incontinence. She denies any diarrhea. She has increasing constipation, last bowel movement 3 days ago. Of note, she had been seen by orthopedics, as she was planning to undergo knee surgery, which had to be canceled as of 01/01/2016 due to abnormalities findings and x-ray consisting with right pleural effusion. As an outpatient, a CT scan of the chest with contrast was performed, compared confirming these right pleural effusion, but in addition a liver lesion was noted. She was to follow-up, but due to symptoms mentioned, she presented to the emergency department for further evaluation. The patient never smoked. Denies any alcohol abuse or recreational drug use. She carries a history of breast cancer in her mother.the patient never had a colonoscopy.   ED Course:  BP 113/61   Pulse 108   Temp 98.6 F (37 C) (Oral)   Resp 21   Ht 5\' 5"  (1.651 m)   Wt 77.1 kg (170 lb)   SpO2 93%   BMI 28.29 kg/m    sodium 123, Urine sodium 41, Creatinine in urine 108.58 glucose 132 calcium 8.1 alkaline phosphatase 185 albumin 2.2  urine cloudy, culture pending WBC 27, last WBC as OP on 7/26 was 9.3   lactic acid 1.93 Troponin 0.01. EKG with sinus tachycardia, no acute coronary syndrome changes. Received 1 L normal saline  CT scan of the chest with contrast  IMPRESSION: 1. Abnormality within the posterior right lobe of liver with adjacent fluid and/or soft tissue extending more caudally. Some of this soft tissue does appear to invade the fat planes of the adjacent right posterolateral chest wall. Although these changes could be inflammatory, a neoplastic process is a definite consideration. Recommend CT of the abdomen pelvis with oral and IV contrast media. 2. Incidental gallstones. 3. Opacity at the right lung base is most likely due to chronic atelectasis with elevation of the right hemidiaphragm also involving the medial right middle lobe. Only a tiny right pleural effusion is seen. This opacity may also be in part due to the process involving the posterior right lobe of liver as described above. 4. Partial compression deformity of T11 and possibly L2 of uncertain acuity. 5. Probable old fracture of the sternum.  Correlate clinically. 6. Moderate thoracic aortic atherosclerosis.  Review of Systems: As per HPI otherwise 10 point review of systems negative.   Past Medical History:  Diagnosis Date  . Arthritis   . Cataract   . Closed right ankle fracture   . Fibromyalgia   . GERD (gastroesophageal reflux disease)   .  Osteoarthritis   . PMR (polymyalgia rheumatica) (HCC)   . Wears glasses     Past Surgical History:  Procedure Laterality Date  . cornea transplants    . CYSTECTOMY     right ankle  . EYE SURGERY     x3  . I&D EXTREMITY Right 06/08/2013   Procedure: IRRIGATION AND DEBRIDEMENT EXTREMITY/RIGHT ANKLE;  Surgeon: Mable Paris, MD;  Location: [facility] OR;  Service:  Orthopedics;  Laterality: Right;  . ORIF ANKLE FRACTURE Right 06/08/2013   Procedure: OPEN REDUCTION INTERNAL FIXATION (ORIF) ANKLE FRACTURE;  Surgeon: Mable Paris, MD;  Location: [facility] OR;  Service: Orthopedics;  Laterality: Right;    Social History Social History   Social History  . Marital status: Married    Spouse name: N/A  . Number of children: N/A  . Years of education: N/A   Occupational History  . Not on file.   Social History Main Topics  . Smoking status: Never Smoker  . Smokeless tobacco: Never Used  . Alcohol use No  . Drug use: No  . Sexual activity: Not on file   Other Topics Concern  . Not on file   Social History Narrative  . No narrative on file     Allergies  Allergen Reactions  . Ciprofloxacin Nausea Only  . Doxycycline Nausea Only  . Erythromycin Nausea Only  . Hepatitis A Vaccine Swelling    No family history on file.    Prior to Admission medications   Medication Sig Start Date End Date Taking? Authorizing Provider  acetaminophen (TYLENOL) 325 MG tablet Take 650 mg by mouth every 6 (six) hours as needed for mild pain or moderate pain.   Yes Historical Provider, MD  aspirin EC 81 MG tablet Take 81 mg by mouth daily.   Yes Historical Provider, MD  Calcium-Magnesium-Zinc [phone] MG TABS Take 1 tablet by mouth daily.   Yes Historical Provider, MD  celecoxib (CELEBREX) 200 MG capsule Take 200 mg by mouth 2 (two) times daily as needed for mild pain or moderate pain.   Yes Historical Provider, MD  Cholecalciferol 2000 UNITS CAPS Take 1 capsule by mouth daily.   Yes Historical Provider, MD  co-enzyme Q-10 30 MG capsule Take 30 mg by mouth 2 (two) times a week.    Yes Historical Provider, MD  CYANOCOBALAMIN PO Take 1 tablet by mouth daily.   Yes Historical Provider, MD  esomeprazole (NEXIUM) 20 MG capsule Take 20 mg by mouth daily as needed (reflux).    Yes Historical Provider, MD  fluorometholone (FML) 0.1 % ophthalmic suspension Place 1  drop into both eyes 2 (two) times daily. 11/05/15  Yes Historical Provider, MD  KRILL OIL ULTRA STRENGTH PO Take 1 capsule by mouth daily.   Yes Historical Provider, MD  metoCLOPramide (REGLAN) 5 MG tablet Take 5 mg by mouth 3 (three) times daily as needed. 11/06/15  Yes Historical Provider, MD  Multiple Vitamin (MULTIVITAMIN WITH MINERALS) TABS tablet Take 1 tablet by mouth daily.   Yes Historical Provider, MD  Probiotic Product (TRUNATURE DIGESTIVE PROBIOTIC) CAPS Take 1 capsule by mouth daily.   Yes Historical Provider, MD  sodium chloride (OCEAN) 0.65 % SOLN nasal spray Place 1 spray into both nostrils as needed for congestion.   Yes Historical Provider, MD  traMADol (ULTRAM) 50 MG tablet Take 1 tablet by mouth 2 (two) times daily. 12/26/15  Yes Historical Provider, MD  vitamin C (ASCORBIC ACID) 500 MG tablet Take 500 mg by mouth daily.  Yes Historical Provider, MD    Physical Exam:    Vitals:   01/20/16 0530 01/20/16 0600 01/20/16 0700 01/20/16 0730  BP: 115/55 109/57 107/76 113/61  Pulse: 103 102 103 108  Resp: 21 18 21 21   Temp:      TempSrc:      SpO2: (!) 88% 93% 93% 93%  Weight:      Height:           Constitutional: NAD, calm, comfortable   Vitals:   01/20/16 0530 01/20/16 0600 01/20/16 0700 01/20/16 0730  BP: 115/55 109/57 107/76 113/61  Pulse: 103 102 103 108  Resp: 21 18 21 21   Temp:      TempSrc:      SpO2: (!) 88% 93% 93% 93%  Weight:      Height:       Eyes: PERRL, lids and conjunctivae normal ENMT: Mucous membranes are moist. Posterior pharynx clear of any exudate or lesions.Normal dentition.  Neck: normal, supple, no masses, no thyromegaly Respiratory:decreased breath sounds at the bases, R>L  no wheezing, no crackles. Normal respiratory effort. No accessory muscle use.  Cardiovascular:  Tachy Regular rate and rhythm, no murmurs / rubs / gallops. No extremity edema. 2+ pedal pulses. No carotid bruits.  Abdomen: mild RUQ tenderness  no masses palpated. No  hepatosplenomegaly. Bowel sounds positive. No fluid wave  Musculoskeletal: no clubbing / cyanosis. No joint deformity upper and lower extremities. Good ROM, no contractures. Normal muscle tone.  Skin: no rashes, lesions, ulcers.  Neurologic: CN 2-12 grossly intact. Sensation intact, DTR normal. Strength 5/5 in all 4.  Psychiatric: Normal judgment and insight. Alert and oriented x 3. Normal mood.     Labs on Admission: I have personally reviewed following labs and imaging studies  CBC:  Recent Labs Lab 01/20/16 0613  WBC 27.0*  NEUTROABS 25.4*  HGB 9.5*  HCT 28.7*  MCV 79.7  PLT 533*    Basic Metabolic Panel:  Recent Labs Lab 01/20/16 0439  NA 123*  K 3.5  CL 84*  CO2 26  GLUCOSE 132*  BUN 7  CREATININE 0.97  CALCIUM 8.1*    GFR: Estimated Creatinine Clearance (by C-G formula based on SCr of 0.97 mg/dL) Female: 16.148.3 mL/min Female: 59.1 mL/min  Liver Function Tests:  Recent Labs Lab 01/20/16 0439  AST 27  ALT 19  ALKPHOS 185*  BILITOT 0.9  PROT 6.0*  ALBUMIN 2.2*   No results for input(s): LIPASE, AMYLASE in the last 168 hours. No results for input(s): AMMONIA in the last 168 hours.  Coagulation Profile: No results for input(s): INR, PROTIME in the last 168 hours.  Cardiac Enzymes: No results for input(s): CKTOTAL, CKMB, CKMBINDEX, TROPONINI in the last 168 hours.  BNP (last 3 results) No results for input(s): PROBNP in the last 8760 hours.  HbA1C: No results for input(s): HGBA1C in the last 72 hours.  CBG: No results for input(s): GLUCAP in the last 168 hours.  Lipid Profile: No results for input(s): CHOL, HDL, LDLCALC, TRIG, CHOLHDL, LDLDIRECT in the last 72 hours.  Thyroid Function Tests: No results for input(s): TSH, T4TOTAL, FREET4, T3FREE, THYROIDAB in the last 72 hours.  Anemia Panel: No results for input(s): VITAMINB12, FOLATE, FERRITIN, TIBC, IRON, RETICCTPCT in the last 72 hours.  Urine analysis:    Component Value Date/Time    COLORURINE YELLOW 01/20/2016 0500   APPEARANCEUR CLOUDY (A) 01/20/2016 0500   LABSPEC 1.011 01/20/2016 0500   PHURINE 6.0 01/20/2016 0500   GLUCOSEU  NEGATIVE 01/20/2016 0500   HGBUR NEGATIVE 01/20/2016 0500   BILIRUBINUR NEGATIVE 01/20/2016 0500   KETONESUR NEGATIVE 01/20/2016 0500   PROTEINUR NEGATIVE 01/20/2016 0500   NITRITE NEGATIVE 01/20/2016 0500   LEUKOCYTESUR NEGATIVE 01/20/2016 0500    Sepsis Labs: @LABRCNTIP (procalcitonin:4,lacticidven:4) )No results found for this or any previous visit (from the past 240 hour(s)).   Radiological Exams on Admission: Dg Chest 2 View  Result Date: 01/20/2016 CLINICAL DATA:  Chest pain tonight. Multiple falls over the past week. EXAM: CHEST  2 VIEW COMPARISON:  Radiographs 12/19/2015.  CT 12/27/2015 FINDINGS: Again seen elevation of the right hemidiaphragm. Increased linear opacity in the adjacent right lung base, may be increased atelectasis or fluid in the fissure, suspect mild increase in right pleural effusion. Tiny left pleural effusion with blunting of the costophrenic angle. Unchanged heart size and aortic contour is allowing for differences in technique. Thoracic aortic atherosclerosis. Increased left basilar atelectasis. No pulmonary edema or pneumothorax. Compression deformity in the lower thoracic spine, as seen on CT. IMPRESSION: 1. Increased linear opacity adjacent the right lung base, may be increased atelectasis or fluid in the fissure. 2. Suspect increased in small right pleural effusion, development small left pleural effusion. Electronically Signed   By: Rubye Oaks M.D.   On: 01/20/2016 05:59     EKG: Independently reviewed.  Assessment/Plan Active Problems:   Hyponatremia   Leukocytosis   Vitamin D deficiency   Essential hypertension   Esophagitis   Fibromyalgia   PMR (polymyalgia rheumatica) (HCC)   Liver lesion, right lobe   Pleural effusion on right    Acute on chronic Hyponatremia  No neuro deficits. Multiple  falls. No LOC. Na on admission 123, baseline 130, followed by Dr. Selena Batten as outpatient. Urine sodium 41, Creatinine in urine 108.58. Does not take diuretics or antidepressants. LActic acid 1.93  CXR with R pleural effusion, liver lesion. Recent CT chest confirmed a possible liver lesion, rule out malignancy . Received 1 l NS  - IV Normal saline 75 cc/h SIADH w/u, urine osmolality.  Cortisol level  -  Fall precautions  [place] Diagnostic thoracentesis -  PT/OT Follow on her lactic acid   Liver lesion, malignant versus abscess. CT chest revealed incidental RUQ suspicious lesion. However, WBC 27,000 (8s 2 weeks ago), for which liver abscess has to be taken into consideration.  Afebrile. Lactic acid 1.93.  CT abdomen and pelvis with contrast. If RUQ liver lesion suspected to be malignant, will proceed with further workup including tumor markers and tissue diagnosis  Blood cultures.  Repeat CBC in am    Hypertension BP 109/57   Pulse 102   Controlled Continue to monitor   GERD, no acute symptoms: Continue PPI  Osteoarthritis/Polymyalgia Rheumatica/Fibromyalgia continue supportive therapy     DVT prophylaxis: SCDs today  Code Status:   Full   Family Communication:  Discussed with patient Disposition Plan: Expect patient to be discharged to home after condition improves Consults called:   No consults   Admission status  Medsurg Obs   Kayia Billinger E, PA-C [facility]   If 7PM-7AM, please contact night-coverage [email or web address] Password [facility]  01/20/2016, 8:47 AM

## 2016-01-20 NOTE — ED Provider Notes (Signed)
[facility]-EMERGENCY DEPT Provider Note   CSN: [id number] Arrival date & time: 01/20/16  0416     History   Chief Complaint Chief Complaint  Patient presents with  . Fall    HPI Shannon Montgomery is a 81 y.o. female.  The history is provided by the patient.  Fall  This is a recurrent problem. The current episode started less than 1 hour ago. The problem occurs daily. The problem has not changed since onset.Pertinent negatives include no chest pain, no abdominal pain and no shortness of breath. The symptoms are aggravated by standing. Nothing relieves the symptoms. She has tried nothing for the symptoms. The treatment provided no relief.   Patient here with multiple falls in the last week. Patient felt very weak and was unable to get up, EMS was called.  Patient is a pleasant 81 year old female with history of Past Medical History:  Diagnosis Date  . Arthritis   . Cataract   . Closed right ankle fracture   . Fibromyalgia   . GERD (gastroesophageal reflux disease)   . Osteoarthritis   . PMR (polymyalgia rheumatica) (HCC)   . Wears glasses     Patient Active Problem List   Diagnosis Date Noted  . Ankle fracture 06/08/2013  . Open right ankle fracture 06/08/2013  . VITAMIN D DEFICIENCY 07/15/2007  . HYPERTENSION, ESSENTIAL NOS 07/15/2007  . GERD 07/15/2007  . OTHER MALAISE AND FATIGUE 07/15/2007  . URI 02/12/2007    Past Surgical History:  Procedure Laterality Date  . cornea transplants    . CYSTECTOMY     right ankle  . EYE SURGERY     x3  . I&D EXTREMITY Right 06/08/2013   Procedure: IRRIGATION AND DEBRIDEMENT EXTREMITY/RIGHT ANKLE;  Surgeon: Mable Paris, MD;  Location: [facility] OR;  Service: Orthopedics;  Laterality: Right;  . ORIF ANKLE FRACTURE Right 06/08/2013   Procedure: OPEN REDUCTION INTERNAL FIXATION (ORIF) ANKLE FRACTURE;  Surgeon: Mable Paris, MD;  Location: [facility] OR;  Service: Orthopedics;  Laterality: Right;    OB History    No data  available       Home Medications    Prior to Admission medications   Medication Sig Start Date End Date Taking? Authorizing Provider  acetaminophen (TYLENOL) 325 MG tablet Take 650 mg by mouth every 6 (six) hours as needed for mild pain or moderate pain.   Yes Historical Provider, MD  aspirin EC 81 MG tablet Take 81 mg by mouth daily.   Yes Historical Provider, MD  Calcium-Magnesium-Zinc [phone] MG TABS Take 1 tablet by mouth daily.   Yes Historical Provider, MD  celecoxib (CELEBREX) 200 MG capsule Take 200 mg by mouth 2 (two) times daily as needed for mild pain or moderate pain.   Yes Historical Provider, MD  Cholecalciferol 2000 UNITS CAPS Take 1 capsule by mouth daily.   Yes Historical Provider, MD  co-enzyme Q-10 30 MG capsule Take 30 mg by mouth 2 (two) times a week.    Yes Historical Provider, MD  CYANOCOBALAMIN PO Take 1 tablet by mouth daily.   Yes Historical Provider, MD  esomeprazole (NEXIUM) 20 MG capsule Take 20 mg by mouth daily as needed (reflux).    Yes Historical Provider, MD  fluorometholone (FML) 0.1 % ophthalmic suspension Place 1 drop into both eyes 2 (two) times daily. 11/05/15  Yes Historical Provider, MD  KRILL OIL ULTRA STRENGTH PO Take 1 capsule by mouth daily.   Yes Historical Provider, MD  metoCLOPramide (REGLAN) 5 MG  tablet Take 5 mg by mouth 3 (three) times daily as needed. 11/06/15  Yes Historical Provider, MD  Multiple Vitamin (MULTIVITAMIN WITH MINERALS) TABS tablet Take 1 tablet by mouth daily.   Yes Historical Provider, MD  Probiotic Product (TRUNATURE DIGESTIVE PROBIOTIC) CAPS Take 1 capsule by mouth daily.   Yes Historical Provider, MD  sodium chloride (OCEAN) 0.65 % SOLN nasal spray Place 1 spray into both nostrils as needed for congestion.   Yes Historical Provider, MD  traMADol (ULTRAM) 50 MG tablet Take 1 tablet by mouth 2 (two) times daily. 12/26/15  Yes Historical Provider, MD  vitamin C (ASCORBIC ACID) 500 MG tablet Take 500 mg by mouth daily.   Yes  Historical Provider, MD    Family History No family history on file.  Social History Social History  Substance Use Topics  . Smoking status: Never Smoker  . Smokeless tobacco: Never Used  . Alcohol use No     Allergies   Ciprofloxacin; Doxycycline; Erythromycin; and Hepatitis a vaccine   Review of Systems Review of Systems  Respiratory: Negative for shortness of breath.   Cardiovascular: Negative for chest pain.  Gastrointestinal: Negative for abdominal pain.  All other systems reviewed and are negative.    Physical Exam Updated Vital Signs BP 119/60 (BP Location: Left Arm)   Pulse 113   Temp 98.6 F (37 C) (Oral)   Resp 18   Ht 5\' 5"  (1.651 m)   Wt 170 lb (77.1 kg)   SpO2 95%   BMI 28.29 kg/m   Physical Exam  Constitutional: She is oriented to person, place, and time. She appears well-developed and well-nourished.  HENT:  Head: Normocephalic and atraumatic.  Old bruise to the right temporal area.  Eyes: Right eye exhibits no discharge.  Cardiovascular: Regular rhythm and normal heart sounds.   No murmur heard. Tachycardia  Pulmonary/Chest: Effort normal and breath sounds normal. She has no wheezes. She has no rales.  Abdominal: Soft. She exhibits no distension. There is no tenderness.  Musculoskeletal:  Full range motion of bilateral lower extremities.  Neurological: She is oriented to person, place, and time.  Skin: Skin is warm and dry. She is not diaphoretic.  Psychiatric: She has a normal mood and affect.  Nursing note and vitals reviewed.    ED Treatments / Results  Labs (all labs ordered are listed, but only abnormal results are displayed) Labs Reviewed  COMPREHENSIVE METABOLIC PANEL - Abnormal; Notable for the following:       Result Value   Sodium 123 (*)    Chloride 84 (*)    Glucose, Bld 132 (*)    Calcium 8.1 (*)    Total Protein 6.0 (*)    Albumin 2.2 (*)    Alkaline Phosphatase 185 (*)    GFR calc non Af Amer 54 (*)    All other  components within normal limits  URINALYSIS, ROUTINE W REFLEX MICROSCOPIC (NOT AT [facility]) - Abnormal; Notable for the following:    APPearance CLOUDY (*)    All other components within normal limits  I-STAT CG4 LACTIC ACID, ED - Abnormal; Notable for the following:    Lactic Acid, Venous 1.93 (*)    All other components within normal limits  URINE CULTURE  CBC WITH DIFFERENTIAL/PLATELET  NA AND K (SODIUM & POTASSIUM), RAND UR  CREATININE, URINE, RANDOM  I-STAT TROPOININ, ED    EKG  EKG Interpretation None       Radiology Dg Chest 2 View  Result Date:  01/20/2016 CLINICAL DATA:  Chest pain tonight. Multiple falls over the past week. EXAM: CHEST  2 VIEW COMPARISON:  Radiographs 12/19/2015.  CT 12/27/2015 FINDINGS: Again seen elevation of the right hemidiaphragm. Increased linear opacity in the adjacent right lung base, may be increased atelectasis or fluid in the fissure, suspect mild increase in right pleural effusion. Tiny left pleural effusion with blunting of the costophrenic angle. Unchanged heart size and aortic contour is allowing for differences in technique. Thoracic aortic atherosclerosis. Increased left basilar atelectasis. No pulmonary edema or pneumothorax. Compression deformity in the lower thoracic spine, as seen on CT. IMPRESSION: 1. Increased linear opacity adjacent the right lung base, may be increased atelectasis or fluid in the fissure. 2. Suspect increased in small right pleural effusion, development small left pleural effusion. Electronically Signed   By: Rubye Oaks M.D.   On: 01/20/2016 05:59    Procedures Procedures (including critical care time)  Medications Ordered in ED Medications  sodium chloride 0.9 % bolus 1,000 mL (1,000 mLs Intravenous New Bag/Given 01/20/16 0510)     Initial Impression / Assessment and Plan / ED Course  I have reviewed the triage vital signs and the nursing notes.  Pertinent labs & imaging results that were available during my  care of the patient were reviewed by me and considered in my medical decision making (see chart for details).  Clinical Course    Patient is a pleasant 81 year old female with history of my polymyalgia rheumatica, fibromyalgia, arthritis presenting today with recurrent falls and weakness. Patient has been reports of last week she's fallen several times. Today she felt so weak that she could not get back into bed. On arrival patient is tachycardic appears mildly dry on exam. She did not have an actual fall today, no bruising or focal tenderness. She does report a global weakness. We will assess for infection such as chest x-ray, UA we will do electrolytes.  6:20 AM X-ray  shows change from recent.. Patient had chest CT done recently as an outpatient showing liver lesion potentially consistent with neoplasm. At the time there was only a small pleural effusion. It appears that pleural effusion has gotten larger.  Patient is hyponatremic. We'll send urine lytes. Will admit for further work up of hyponatremia.   Final Clinical Impressions(s) / ED Diagnoses   Final diagnoses:  None    New Prescriptions New Prescriptions   No medications on file     Saamiya Jeppsen Randall An, MD 01/20/16 [phone]

## 2016-01-20 NOTE — ED Triage Notes (Signed)
Pt attempted to get out of bed this morning and "legs got out from under" her. Pt denies hitting head, loc, or dizziness. Right hip pain present , but pt states that occurred before the fall. Pt has had multiple falls over the last several weeks per husband

## 2016-01-20 NOTE — Progress Notes (Signed)
Admitted to 6E30 via stretcher from ED. No family with patient.Assessment as charted.Husband en route,to take home medications home.No complaints @ present.Gait unsteady, bed alarm activated.

## 2016-01-20 NOTE — Progress Notes (Signed)
Pharmacy Antibiotic Note  Shannon Montgomery is a 81 y.o. female admitted on 01/20/2016 s/p fall.  Pharmacy has been consulted for Vancomycin/Zosyn dosing for PNA/intra-abdominal infection.  Plan: -Zosyn 3.375 g q8h -Vancomycin 750 mg q12h -Monitor clinical course, LOT, culture data, VT as indicated  Height: 5\' 5"  (165.1 cm) Weight: 170 lb (77.1 kg) IBW/kg (Calculated) : 57  Temp (24hrs), Avg:98.6 F (37 C), Min:98.6 F (37 C), Max:98.6 F (37 C)   Recent Labs Lab 01/20/16 0439 01/20/16 0450 01/20/16 0613  WBC  --   --  27.0*  CREATININE 0.97  --   --   LATICACIDVEN  --  1.93*  --     Estimated Creatinine Clearance (by C-G formula based on SCr of 0.97 mg/dL) Female: 16.148.3 mL/min Female: 59.1 mL/min    Allergies  Allergen Reactions  . Ciprofloxacin Nausea Only  . Doxycycline Nausea Only  . Erythromycin Nausea Only  . Hepatitis A Vaccine Swelling    Antimicrobials this admission: 8/27 Vancomycin >>  8/27 Zosyn >>   Dose adjustments this admission: n/a  Microbiology results: 8/27 UCx: IP    Thank you for allowing pharmacy to be a part of this patient's care.  Fredonia HighlandMichael Bitonti, PharmD PGY-1 Pharmacy Resident Pager: [phone] 01/20/2016   I discussed / reviewed the pharmacy note by Dr. Malachi CarlBitonti and I agree with the resident's findings and plans as documented.  Toys 'R' UsKimberly Sible Straley, Pharm.D., BCPS Clinical Pharmacist Pager [phone] 01/20/2016 11:12 AM

## 2016-01-21 ENCOUNTER — Inpatient Hospital Stay (HOSPITAL_COMMUNITY): Payer: Medicare Other

## 2016-01-21 LAB — COMPREHENSIVE METABOLIC PANEL
ALT: 18 U/L (ref 14–54)
ANION GAP: 11 (ref 5–15)
AST: 23 U/L (ref 15–41)
Albumin: 1.9 g/dL — ABNORMAL LOW (ref 3.5–5.0)
Alkaline Phosphatase: 152 U/L — ABNORMAL HIGH (ref 38–126)
BUN: <5 mg/dL — ABNORMAL LOW (ref 6–20)
CALCIUM: 7.8 mg/dL — AB (ref 8.9–10.3)
CHLORIDE: 84 mmol/L — AB (ref 101–111)
CO2: 28 mmol/L (ref 22–32)
Creatinine, Ser: 0.79 mg/dL (ref 0.44–1.00)
GFR calc non Af Amer: >60 mL/min (ref >60)
Glucose, Bld: 137 mg/dL — ABNORMAL HIGH (ref 65–99)
Potassium: 3.1 mmol/L — ABNORMAL LOW (ref 3.5–5.1)
SODIUM: 123 mmol/L — AB (ref 135–145)
Total Bilirubin: 0.8 mg/dL (ref 0.3–1.2)
Total Protein: 5.1 g/dL — ABNORMAL LOW (ref 6.5–8.1)

## 2016-01-21 LAB — URINE CULTURE: Culture: NO GROWTH

## 2016-01-21 LAB — CBC
HCT: 27.8 % — ABNORMAL LOW (ref 36.0–46.0)
Hemoglobin: 9.4 g/dL — ABNORMAL LOW (ref 12.0–15.0)
MCH: 26.6 pg (ref 26.0–34.0)
MCHC: 33.8 g/dL (ref 30.0–36.0)
MCV: 78.8 fL (ref 78.0–100.0)
PLATELETS: 486 10*3/uL — AB (ref 150–400)
RBC: 3.53 MIL/uL — ABNORMAL LOW (ref 3.87–5.11)
RDW: 14.5 % (ref 11.5–15.5)
WBC: 26.2 10*3/uL — ABNORMAL HIGH (ref 4.0–10.5)

## 2016-01-21 LAB — OSMOLALITY, URINE: Osmolality, Ur: 315 mOsm/kg (ref 300–900)

## 2016-01-21 LAB — OSMOLALITY: OSMOLALITY: 249 mosm/kg — AB (ref 275–295)

## 2016-01-21 MED ORDER — SODIUM CHLORIDE 0.9 % IV SOLN
INTRAVENOUS | Status: DC
  Administered 2016-01-21 – 2016-01-22 (×3): via INTRAVENOUS

## 2016-01-21 MED ORDER — POTASSIUM CHLORIDE CRYS ER 20 MEQ PO TBCR
40.0000 meq | EXTENDED_RELEASE_TABLET | Freq: Four times a day (QID) | ORAL | Status: AC
  Administered 2016-01-21: 40 meq via ORAL
  Filled 2016-01-21: qty 2

## 2016-01-21 MED ORDER — MIDAZOLAM HCL 2 MG/2ML IJ SOLN
INTRAMUSCULAR | Status: AC | PRN
  Administered 2016-01-21: 1 mg via INTRAVENOUS

## 2016-01-21 MED ORDER — HEPARIN SODIUM (PORCINE) 5000 UNIT/ML IJ SOLN
5000.0000 [IU] | Freq: Three times a day (TID) | INTRAMUSCULAR | Status: DC
  Administered 2016-01-21 – 2016-01-25 (×10): 5000 [IU] via SUBCUTANEOUS
  Filled 2016-01-21 (×8): qty 1

## 2016-01-21 MED ORDER — FENTANYL CITRATE (PF) 100 MCG/2ML IJ SOLN
INTRAMUSCULAR | Status: AC
  Filled 2016-01-21: qty 4

## 2016-01-21 MED ORDER — FENTANYL CITRATE (PF) 100 MCG/2ML IJ SOLN
INTRAMUSCULAR | Status: AC | PRN
  Administered 2016-01-21 (×2): 25 ug via INTRAVENOUS

## 2016-01-21 MED ORDER — MIDAZOLAM HCL 2 MG/2ML IJ SOLN
INTRAMUSCULAR | Status: AC
  Filled 2016-01-21: qty 4

## 2016-01-21 MED ORDER — LIDOCAINE HCL 1 % IJ SOLN
INTRAMUSCULAR | Status: AC
  Filled 2016-01-21: qty 20

## 2016-01-21 NOTE — Sedation Documentation (Signed)
Patient is resting comfortably. 

## 2016-01-21 NOTE — Procedures (Signed)
Interventional Radiology Procedure Note  Procedure:  CT guided drainage of right retroperitoneal/perihepatic abscess  Complications: None  Estimated Blood Loss: < 10 mL  Findings:  12 Fr drain placed in perihepatic component of complex abscess.  Purulent fluid return. Attached to suction bulb.  Will follow output.  Sample of fluid sent for culture.  Jodi MarbleGlenn T. Fredia SorrowYamagata, M.D Pager:  [phone]

## 2016-01-21 NOTE — Consult Note (Signed)
Chief Complaint: Patient was seen in consultation today for perihepatic/retroperitoneal abscess drain placement Chief Complaint  Patient presents with  . Fall   at the request of Dr Fabio Bering  Referring Physician(s): Dr Derrell Lolling Dr Burney Gauze  Supervising Physician: Irish Lack  Patient Status: Inpatient  History of Present Illness: Shannon Montgomery is a 81 y.o. female   Pt admitted with weakness; falls at home RUQ abd pain for weeks/months Leukocytosis CT 8/27: IMPRESSION: 1. Retroperitoneal fluid collection extends from the RIGHT iliac fossa to the RIGHT hepatic lobe margin consistent with large RETROPERITONEAL ABSCESS. There septation and gas collection in the most superior aspect adjacent to the liver with mass-effect on liver. Recommend percutaneous drainage and cytology of the fluid collection RIGHT lateral to the liver. 2. Presumed hepatic abscess in the most inferior RIGHT hepatic lobe is slightly smaller. 3. Ill-defined tissue associated with the LEFT adrenal gland is indeterminate. Cannot exclude malignancy. Recommend correlation with cytology as above. 4. Thicken along the perirenal fascia laterally adjacent to the RIGHT kidney. Differential includes infection or malignancy. 5. Thickening of the terminal ileum and ascending colon may be secondary to the retroperitoneal infection. 6. Cholelithiasis.  Request made for fluid/abscess collection drain placement Imaging reviewed with Dr Deanne Coffer and Dr Fredia Sorrow Approved procedure Scheduled now for abscess drain placement  Past Medical History:  Diagnosis Date  . Arthritis   . Cataract   . Closed right ankle fracture   . Fibromyalgia   . GERD (gastroesophageal reflux disease)   . Osteoarthritis   . PMR (polymyalgia rheumatica) (HCC)   . Wears glasses     Past Surgical History:  Procedure Laterality Date  . cornea transplants    . CYSTECTOMY     right ankle  . EYE SURGERY     x3  . I&D  EXTREMITY Right 06/08/2013   Procedure: IRRIGATION AND DEBRIDEMENT EXTREMITY/RIGHT ANKLE;  Surgeon: Mable Paris, MD;  Location: [facility] OR;  Service: Orthopedics;  Laterality: Right;  . ORIF ANKLE FRACTURE Right 06/08/2013   Procedure: OPEN REDUCTION INTERNAL FIXATION (ORIF) ANKLE FRACTURE;  Surgeon: Mable Paris, MD;  Location: [facility] OR;  Service: Orthopedics;  Laterality: Right;    Allergies: Ciprofloxacin; Doxycycline; Erythromycin; and Hepatitis a vaccine  Medications: Prior to Admission medications   Medication Sig Start Date End Date Taking? Authorizing Provider  acetaminophen (TYLENOL) 325 MG tablet Take 650 mg by mouth every 6 (six) hours as needed for mild pain or moderate pain.   Yes Historical Provider, MD  aspirin EC 81 MG tablet Take 81 mg by mouth daily.   Yes Historical Provider, MD  Calcium-Magnesium-Zinc [phone] MG TABS Take 1 tablet by mouth daily.   Yes Historical Provider, MD  celecoxib (CELEBREX) 200 MG capsule Take 200 mg by mouth 2 (two) times daily as needed for mild pain or moderate pain.   Yes Historical Provider, MD  Cholecalciferol 2000 UNITS CAPS Take 1 capsule by mouth daily.   Yes Historical Provider, MD  co-enzyme Q-10 30 MG capsule Take 30 mg by mouth 2 (two) times a week.    Yes Historical Provider, MD  CYANOCOBALAMIN PO Take 1 tablet by mouth daily.   Yes Historical Provider, MD  esomeprazole (NEXIUM) 20 MG capsule Take 20 mg by mouth daily as needed (reflux).    Yes Historical Provider, MD  fluorometholone (FML) 0.1 % ophthalmic suspension Place 1 drop into both eyes 2 (two) times daily. 11/05/15  Yes Historical Provider, MD  KRILL OIL ULTRA STRENGTH  PO Take 1 capsule by mouth daily.   Yes Historical Provider, MD  metoCLOPramide (REGLAN) 5 MG tablet Take 5 mg by mouth 3 (three) times daily as needed. 11/06/15  Yes Historical Provider, MD  Multiple Vitamin (MULTIVITAMIN WITH MINERALS) TABS tablet Take 1 tablet by mouth daily.   Yes Historical  Provider, MD  Probiotic Product (TRUNATURE DIGESTIVE PROBIOTIC) CAPS Take 1 capsule by mouth daily.   Yes Historical Provider, MD  sodium chloride (OCEAN) 0.65 % SOLN nasal spray Place 1 spray into both nostrils as needed for congestion.   Yes Historical Provider, MD  traMADol (ULTRAM) 50 MG tablet Take 1 tablet by mouth 2 (two) times daily. 12/26/15  Yes Historical Provider, MD  vitamin C (ASCORBIC ACID) 500 MG tablet Take 500 mg by mouth daily.   Yes Historical Provider, MD     Family History  Problem Relation Age of Onset  . Breast cancer Mother   . Anuerysm Father   . Diabetes Brother   . Brain cancer Brother   . Alcoholism Brother     Social History   Social History  . Marital status: Married    Spouse name: N/A  . Number of children: N/A  . Years of education: N/A   Social History Main Topics  . Smoking status: Never Smoker  . Smokeless tobacco: Never Used  . Alcohol use No  . Drug use: No  . Sexual activity: No   Other Topics Concern  . None   Social History Narrative  . None    Review of Systems: A 12 point ROS discussed and pertinent positives are indicated in the HPI above.  All other systems are negative.  Review of Systems  Constitutional: Positive for activity change, appetite change and fatigue.  Respiratory: Negative for shortness of breath.   Gastrointestinal: Positive for abdominal pain.  Musculoskeletal: Positive for gait problem.  Neurological: Positive for weakness.  Psychiatric/Behavioral: Negative for behavioral problems and confusion.    Vital Signs: BP (!) 156/77 (BP Location: Left Arm)   Pulse 98   Temp 98.9 F (37.2 C) (Oral)   Resp 17   Ht 5\' 5"  (1.651 m)   Wt 175 lb 14.8 oz (79.8 kg)   SpO2 97%   BMI 29.28 kg/m   Physical Exam  Constitutional: She is oriented to person, place, and time.  Cardiovascular: Normal rate and regular rhythm.   Pulmonary/Chest: Effort normal and breath sounds normal.  Abdominal: Bowel sounds are  normal. There is tenderness.  Musculoskeletal: Normal range of motion.  Neurological: She is alert and oriented to person, place, and time.  Skin: Skin is warm and dry.  Psychiatric: She has a normal mood and affect. Her behavior is normal. Judgment and thought content normal.  Nursing note and vitals reviewed.   Mallampati Score:  MD Evaluation Airway: WNL Heart: WNL Abdomen: WNL Chest/ Lungs: WNL ASA  Classification: 3 Mallampati/Airway Score: Two  Imaging: X-ray Chest Pa And Lateral  Result Date: 01/20/2016 CLINICAL DATA:  Pleural effusion. EXAM: CHEST  2 VIEW COMPARISON:  01/20/2016, earlier the same day.  12/19/2015. FINDINGS: Lower lung volumes with increased atelectasis at the right lung base. Small right pleural effusion again noted. New atelectasis seen at the left lung base on this lower volume film. Low lung volumes accentuate the cardiopericardial silhouette. Bones are diffusely demineralized. IMPRESSION: Low volume film with increased bibasilar atelectasis and small right pleural effusion. Electronically Signed   By: Kennith Center M.D.   On: 01/20/2016 16:32  Dg Chest 2 View  Result Date: 01/20/2016 CLINICAL DATA:  Chest pain tonight. Multiple falls over the past week. EXAM: CHEST  2 VIEW COMPARISON:  Radiographs 12/19/2015.  CT 12/27/2015 FINDINGS: Again seen elevation of the right hemidiaphragm. Increased linear opacity in the adjacent right lung base, may be increased atelectasis or fluid in the fissure, suspect mild increase in right pleural effusion. Tiny left pleural effusion with blunting of the costophrenic angle. Unchanged heart size and aortic contour is allowing for differences in technique. Thoracic aortic atherosclerosis. Increased left basilar atelectasis. No pulmonary edema or pneumothorax. Compression deformity in the lower thoracic spine, as seen on CT. IMPRESSION: 1. Increased linear opacity adjacent the right lung base, may be increased atelectasis or fluid  in the fissure. 2. Suspect increased in small right pleural effusion, development small left pleural effusion. Electronically Signed   By: Rubye Oaks M.D.   On: 01/20/2016 05:59   Ct Chest W Contrast  Result Date: 12/27/2015 CLINICAL DATA:  Small right pleural effusion questioned on chest x-ray, preop for total knee replacement EXAM: CT CHEST WITH CONTRAST TECHNIQUE: Multidetector CT imaging of the chest was performed during intravenous contrast administration. CONTRAST:  75mL ISOVUE-300 IOPAMIDOL (ISOVUE-300) INJECTION 61% COMPARISON:  Chest x-ray of 12/19/2015 FINDINGS: Cardiovascular: There is good opacification of the thoracic aorta. The mid ascending thoracic aorta measures 36 mm in diameter. The pulmonary arteries are opacified as well with no central abnormality evident. Coronary artery calcifications are present in the distribution of the left anterior descending coronary artery. No pericardial effusion is seen. Moderate thoracic aortic atherosclerosis is present. Mediastinum/Nodes: No mediastinal or hilar adenopathy is noted. The thyroid gland is unremarkable. Lungs/Pleura: On lung window images, only a tiny right pleural effusion is present. The majority the opacity at the posterior right lung base appears to be due to atelectasis involving both the right lower lobe and the medial right middle lobe possibly as result of chronic elevation of the right hemidiaphragm. An inflammatory process cannot be excluded, but I would favor this being a chronic process. The left lung is clear other than mild linear scarring in the posterior left lower lobe. The central bronchi are patent. No suspicious lung nodule or mass is seen. Upper Abdomen: However, there is a definite abnormality within the posterior aspect of the right lobe of liver. On axial image 116 series 4 there is a rounded lesion in the posterior right lobe of liver of 5.0 x 4.8 cm with probable adjacent fluid. More caudally there is a lower  attenuation portion of this lesion measuring 3.4 x 3.5 cm with attenuation of 15 HU. Immediately adjacent extending along the posterior capsule of the right posterior lobe of liver there is a collection of fluid or soft tissue measuring 17 HU extending into the right retroperitoneum. This collection also appears to invade the adjacent fat planes and possibly the right lower posterolateral chest wall. Although these changes could be inflammatory in nature, a neoplastic process is a definite consideration. CT of the abdomen pelvis with oral and IV contrast media is recommended. Incidental gallstones also are noted within the gallbladder, the largest of 2.9 cm. No gallbladder wall thickening is seen. A few minimally prominent lymph nodes are present near the porta hepatis. Musculoskeletal: On the sagittal view there appears to be compression deformity of T11 and L2 of uncertain acuity. Also there is irregularity of the sternum most likely due to prior fracture with healing. Clinical correlation is recommended. IMPRESSION: 1. Abnormality within the posterior right  lobe of liver with adjacent fluid and/or soft tissue extending more caudally. Some of this soft tissue does appear to invade the fat planes of the adjacent right posterolateral chest wall. Although these changes could be inflammatory, a neoplastic process is a definite consideration. Recommend CT of the abdomen pelvis with oral and IV contrast media. 2. Incidental gallstones. 3. Opacity at the right lung base is most likely due to chronic atelectasis with elevation of the right hemidiaphragm also involving the medial right middle lobe. Only a tiny right pleural effusion is seen. This opacity may also be in part due to the process involving the posterior right lobe of liver as described above. 4. Partial compression deformity of T11 and possibly L2 of uncertain acuity. 5. Probable old fracture of the sternum.  Correlate clinically. 6. Moderate thoracic aortic  atherosclerosis. Electronically Signed   By: Dwyane DeePaul  Barry M.D.   On: 12/27/2015 16:24   [place] Chest  Result Date: 01/20/2016 CLINICAL DATA:  Suspected right pleural effusion. EXAM: CHEST ULTRASOUND COMPARISON:  Chest radiograph 01/20/2016 FINDINGS: Focused right chest wall ultrasound demonstrates small right subpulmonic effusion with maximum thickness of 12 mm, not amenable to thoracocentesis. IMPRESSION: Small right pleural effusion. Electronically Signed   By: Ted Mcalpineobrinka  Dimitrova M.D.   On: 01/20/2016 11:36   Ct Abdomen Pelvis W Contrast  Result Date: 01/20/2016 CLINICAL DATA:  Liver abscess. EXAM: CT ABDOMEN AND PELVIS WITH CONTRAST TECHNIQUE: Multidetector CT imaging of the abdomen and pelvis was performed using the standard protocol following bolus administration of intravenous contrast. CONTRAST:  100 mL Isovue 300 COMPARISON:  Chest CT 12/27/2015 FINDINGS: Lower chest: Band of atelectasis in the LEFT and RIGHT lower lobe. Small bilateral pleural effusions. Hepatobiliary: The inferior aspect of the RIGHT hepatic lobe is a 3.1 by 2.4 cm hypodense lesion with ill-defined margins which is decreased from 3.4 by 3.4 cm on prior. However, there is a complex fluid collection adjacent to RIGHT hepatic lobe which is increased in size compared to prior measuring 10.2 by 3.2 cm (image 24, series 201) increased from 5.7 by 2.2 cm. This perihepatic fluid collection has internal septations and gas locules. Fluid collections extend along the RIGHT retroperitoneal space into the iliac fossa. Collection measures 5.9 by 4.8 cm (image 46, series 201) at the level of the iliac crest. Fluid collection extends anterior to the iliacus muscle the level of the RIGHT external iliac artery measuring 4.4 by 3.2 cm this level (image 64, series 201). There is thickening along the lateral aspect of the the para renal fascia (image 30, series 201. The remaining of the liver parenchyma is normal. There multiple large gallstones within the  lumen gallbladder ranging in size from 12 to 20 mm. The gallbladder distension. Common bile duct normal. Pancreas: Normal Spleen: Normal spleen Adrenals/urinary tract: There is ill-defined tissue associated the inferior aspect of the LEFT adrenal gland (image 26, series 201. RIGHT adrenal glands normal. The kidneys enhance symmetrically. Ureters appear normal. Bladder normal Stomach/Bowel: The stomach, small bowel normal. There is some thickening along the terminal ileum. There is thickening along the ascending colon just above the cecum. This is adjacent to the retroperitoneal fluid collection / infection may be secondary inflammation. Vascular/Lymphatic:  Clear calcification Reproductive: Uterus and ovaries normal. Other: No free fluid. Musculoskeletal: No aggressive osseous lesion. IMPRESSION: 1. Retroperitoneal fluid collection extends from the RIGHT iliac fossa to the RIGHT hepatic lobe margin consistent with large RETROPERITONEAL ABSCESS. There septation and gas collection in the most superior aspect adjacent to  the liver with mass-effect on liver. Recommend percutaneous drainage and cytology of the fluid collection RIGHT lateral to the liver. 2. Presumed hepatic abscess in the most inferior RIGHT hepatic lobe is slightly smaller. 3. Ill-defined tissue associated with the LEFT adrenal gland is indeterminate. Cannot exclude malignancy. Recommend correlation with cytology as above. 4. Thicken along the perirenal fascia laterally adjacent to the RIGHT kidney. Differential includes infection or malignancy. 5. Thickening of the terminal ileum and ascending colon may be secondary to the retroperitoneal infection. 6. Cholelithiasis. These results will be called to the ordering clinician or representative by the Radiologist Assistant, and communication documented in the PACS or zVision Dashboard. Electronically Signed   By: Genevive Bi M.D.   On: 01/20/2016 16:29    Labs:  CBC:  Recent Labs   12/19/15 1154 01/20/16 0613 01/21/16 0126  WBC 9.3 27.0* 26.2*  HGB 11.7* 9.5* 9.4*  HCT 35.4* 28.7* 27.8*  PLT 358 533* 486*    COAGS:  Recent Labs  12/19/15 1154 01/20/16 1627  INR 1.03 1.28  APTT 34 36    BMP:  Recent Labs  01/20/16 0439 01/20/16 0917 01/20/16 1627 01/21/16 0126  NA 123* 122* 120* 123*  K 3.5 3.5 3.3* 3.1*  CL 84* 86* 85* 84*  CO2 26 24 22 28   GLUCOSE 132* 140* 144* 137*  BUN 7 6 6  <5*  CALCIUM 8.1* 7.9* 7.7* 7.8*  CREATININE 0.97 0.87 0.76 0.79  GFRNONAA 54* >60 >60 >60  GFRAA >60 >60 >60 >60    LIVER FUNCTION TESTS:  Recent Labs  01/20/16 0439 01/21/16 0126  BILITOT 0.9 0.8  AST 27 23  ALT 19 18  ALKPHOS 185* 152*  PROT 6.0* 5.1*  ALBUMIN 2.2* 1.9*    TUMOR MARKERS: No results for input(s): AFPTM, CEA, CA199, CHROMGRNA in the last 8760 hours.  Assessment and Plan:  RUQ pain Weakness; leukocytosis CT reveals retroperitoneal abscess Now scheduled for drain placement Risks and Benefits discussed with the patient including bleeding, infection, damage to adjacent structures, bowel perforation/fistula connection, and sepsis. All of the patient's questions were answered, patient is agreeable to proceed. Consent signed and in chart.   Thank you for this interesting consult.  I greatly enjoyed meeting MALEAHA HUGHETT and look forward to participating in their care.  A copy of this report was sent to the requesting provider on this date.  Electronically Signed: Ralene Muskrat A 01/21/2016, 9:13 AM   I spent a total of 40 Minutes    in face to face in clinical consultation, greater than 50% of which was counseling/coordinating care for perihepatic/retroperitoneal abscess drain placement

## 2016-01-21 NOTE — Sedation Documentation (Signed)
MD at bedside.  Dr Oletta CohnYamagat in procedure explained, questions answered

## 2016-01-21 NOTE — Consult Note (Signed)
[facility] Surgery Consult Note  Shannon Montgomery 1935/02/03  [id number].    Requesting MD: Dr. Candiss Norse Chief Complaint/Reason for Consult: retroperitoneal fluid collection HPI:  81 y/o female with PMH GERD, osteoarthritis, fibromyalgia, and chronic hyponatremia (followed by Dr. Maudie Mercury, baseline ~130) who presented to the ED after a fall. Patient reports 2-3 weeks of weakness, decreased appetite, and intermittent nausea. Her sodium level in the ED 123. She received a CXR 12/19/15 as part of pre-op workup for total knee replacement and incidentally found a right pleural effision and  Right basilar atelectasis. She follow-up with her PCP for further imaging and was started on cipro for possible PNA. Only took cipro for 2 days - caused GI upset and decreased appetite. CT scan of the chest was scheduled for 8/3 and she was found to have liver lesions on chest CT  And she did not following up for her CT abd/pelvis. CT abd/pelvis 8/27 significant for retroperitoneal fluid collection (presumed hepatic abscess) and thickening of the terminal ileum and general surgery was asked to consult. She denies any PMH of abdominal surgeries. Her husband is in the room with her today.  ROS: All systems reviewed and otherwise negative except for as above  Family History  Problem Relation Age of Onset  . Breast cancer Mother   . Anuerysm Father   . Diabetes Brother   . Brain cancer Brother   . Alcoholism Brother     Past Medical History:  Diagnosis Date  . Arthritis   . Cataract   . Closed right ankle fracture   . Fibromyalgia   . GERD (gastroesophageal reflux disease)   . Osteoarthritis   . PMR (polymyalgia rheumatica) (HCC)   . Wears glasses     Past Surgical History:  Procedure Laterality Date  . cornea transplants    . CYSTECTOMY     right ankle  . EYE SURGERY     x3  . I&D EXTREMITY Right 06/08/2013   Procedure: IRRIGATION AND DEBRIDEMENT EXTREMITY/RIGHT ANKLE;  Surgeon: Nita Sells, MD;  Location: [place];  Service: Orthopedics;  Laterality: Right;  . ORIF ANKLE FRACTURE Right 06/08/2013   Procedure: OPEN REDUCTION INTERNAL FIXATION (ORIF) ANKLE FRACTURE;  Surgeon: Nita Sells, MD;  Location: [place];  Service: Orthopedics;  Laterality: Right;    Social History:  reports that she has never smoked. She has never used smokeless tobacco. She reports that she does not drink alcohol or use drugs.  Allergies:  Allergies  Allergen Reactions  . Ciprofloxacin Nausea Only  . Doxycycline Nausea Only  . Erythromycin Nausea Only  . Hepatitis A Vaccine Swelling    Medications Prior to Admission  Medication Sig Dispense Refill  . acetaminophen (TYLENOL) 325 MG tablet Take 650 mg by mouth every 6 (six) hours as needed for mild pain or moderate pain.    Marland Kitchen aspirin EC 81 MG tablet Take 81 mg by mouth daily.    . Calcium-Magnesium-Zinc 333-133-5 MG TABS Take 1 tablet by mouth daily.    . celecoxib (CELEBREX) 200 MG capsule Take 200 mg by mouth 2 (two) times daily as needed for mild pain or moderate pain.    . Cholecalciferol 2000 UNITS CAPS Take 1 capsule by mouth daily.    Marland Kitchen co-enzyme Q-10 30 MG capsule Take 30 mg by mouth 2 (two) times a week.     . CYANOCOBALAMIN PO Take 1 tablet by mouth daily.    Marland Kitchen esomeprazole (NEXIUM) 20 MG capsule Take 20 mg  by mouth daily as needed (reflux).     . fluorometholone (FML) 0.1 % ophthalmic suspension Place 1 drop into both eyes 2 (two) times daily.  6  . KRILL OIL ULTRA STRENGTH PO Take 1 capsule by mouth daily.    . metoCLOPramide (REGLAN) 5 MG tablet Take 5 mg by mouth 3 (three) times daily as needed.  0  . Multiple Vitamin (MULTIVITAMIN WITH MINERALS) TABS tablet Take 1 tablet by mouth daily.    . Probiotic Product (TRUNATURE DIGESTIVE PROBIOTIC) CAPS Take 1 capsule by mouth daily.    . sodium chloride (OCEAN) 0.65 % SOLN nasal spray Place 1 spray into both nostrils as needed for congestion.    . traMADol (ULTRAM) 50 MG  tablet Take 1 tablet by mouth 2 (two) times daily.    . vitamin C (ASCORBIC ACID) 500 MG tablet Take 500 mg by mouth daily.      Blood pressure (!) 156/77, pulse 98, temperature 98.9 F (37.2 C), temperature source Oral, resp. rate 17, height 5' 5"  (1.651 m), weight 79.8 kg (175 lb 14.8 oz), SpO2 97 %. Physical Exam: General: pleasant, overweight white female who is laying in bed in NAD HEENT: head is normocephalic, atraumatic. Heart: regular, rate, and rhythm.  No obvious murmurs, gallops, or rubs noted. Lungs: CTAB, no wheezes, rhonchi, or rales noted.  Respiratory effort nonlabored Abd: soft, NT/ND, +BS, no masses, hernias, or organomegaly MS: all 4 extremities are symmetrical with no cyanosis, clubbing, or edema. Skin: warm and dry with no masses, lesions, or rashes Psych: appropriate affect. Neuro: normal speech  Results for orders placed or performed during the hospital encounter of 01/20/16 (from the past 48 hour(s))  Comprehensive metabolic panel     Status: Abnormal   Collection Time: 01/20/16  4:39 AM  Result Value Ref Range   Sodium 123 (L) 135 - 145 mmol/L   Potassium 3.5 3.5 - 5.1 mmol/L   Chloride 84 (L) 101 - 111 mmol/L   CO2 26 22 - 32 mmol/L   Glucose, Bld 132 (H) 65 - 99 mg/dL   BUN 7 6 - 20 mg/dL   Creatinine, Ser 0.97 0.44 - 1.00 mg/dL   Calcium 8.1 (L) 8.9 - 10.3 mg/dL   Total Protein 6.0 (L) 6.5 - 8.1 g/dL   Albumin 2.2 (L) 3.5 - 5.0 g/dL   AST 27 15 - 41 U/L   ALT 19 14 - 54 U/L   Alkaline Phosphatase 185 (H) 38 - 126 U/L   Total Bilirubin 0.9 0.3 - 1.2 mg/dL   GFR calc non Af Amer 54 (L) >60 mL/min   GFR calc Af Amer >60 >60 mL/min    Comment: (NOTE) The eGFR has been calculated using the CKD EPI equation. This calculation has not been validated in all clinical situations. eGFR's persistently <60 mL/min signify possible Chronic Kidney Disease.    Anion gap 13 5 - 15  I-stat troponin, ED     Status: None   Collection Time: 01/20/16  4:47 AM  Result  Value Ref Range   Troponin i, poc 0.01 0.00 - 0.08 ng/mL   Comment 3            Comment: Due to the release kinetics of cTnI, a negative result within the first hours of the onset of symptoms does not rule out myocardial infarction with certainty. If myocardial infarction is still suspected, repeat the test at appropriate intervals.   I-Stat CG4 Lactic Acid, ED     Status:  Abnormal   Collection Time: 01/20/16  4:50 AM  Result Value Ref Range   Lactic Acid, Venous 1.93 (HH) 0.5 - 1.9 mmol/L  Urinalysis, Routine w reflex microscopic (not at [facility])     Status: Abnormal   Collection Time: 01/20/16  5:00 AM  Result Value Ref Range   Color, Urine YELLOW YELLOW   APPearance CLOUDY (A) CLEAR   Specific Gravity, Urine 1.011 1.005 - 1.030   pH 6.0 5.0 - 8.0   Glucose, UA NEGATIVE NEGATIVE mg/dL   Hgb urine dipstick NEGATIVE NEGATIVE   Bilirubin Urine NEGATIVE NEGATIVE   Ketones, ur NEGATIVE NEGATIVE mg/dL   Protein, ur NEGATIVE NEGATIVE mg/dL   Nitrite NEGATIVE NEGATIVE   Leukocytes, UA NEGATIVE NEGATIVE    Comment: MICROSCOPIC NOT DONE ON URINES WITH NEGATIVE PROTEIN, BLOOD, LEUKOCYTES, NITRITE, OR GLUCOSE <1000 mg/dL.  Na and K (sodium & potassium), rand urine     Status: None   Collection Time: 01/20/16  5:00 AM  Result Value Ref Range   Sodium, Ur 41 mmol/L   Potassium Urine 44 mmol/L  Creatinine, urine, random     Status: None   Collection Time: 01/20/16  5:00 AM  Result Value Ref Range   Creatinine, Urine 108.51 mg/dL  CBC with Differential/Platelet     Status: Abnormal   Collection Time: 01/20/16  6:13 AM  Result Value Ref Range   WBC 27.0 (H) 4.0 - 10.5 K/uL   RBC 3.60 (L) 3.87 - 5.11 MIL/uL   Hemoglobin 9.5 (L) 12.0 - 15.0 g/dL   HCT 28.7 (L) 36.0 - 46.0 %   MCV 79.7 78.0 - 100.0 fL   MCH 26.4 26.0 - 34.0 pg   MCHC 33.1 30.0 - 36.0 g/dL   RDW 14.1 11.5 - 15.5 %   Platelets 533 (H) 150 - 400 K/uL   Neutrophils Relative % 94 %   Lymphocytes Relative 2 %   Monocytes  Relative 4 %   Eosinophils Relative 0 %   Basophils Relative 0 %   Neutro Abs 25.4 (H) 1.7 - 7.7 K/uL   Lymphs Abs 0.5 (L) 0.7 - 4.0 K/uL   Monocytes Absolute 1.1 (H) 0.1 - 1.0 K/uL   Eosinophils Absolute 0.0 0.0 - 0.7 K/uL   Basophils Absolute 0.0 0.0 - 0.1 K/uL   Smear Review MORPHOLOGY UNREMARKABLE   Basic metabolic panel     Status: Abnormal   Collection Time: 01/20/16  9:17 AM  Result Value Ref Range   Sodium 122 (L) 135 - 145 mmol/L   Potassium 3.5 3.5 - 5.1 mmol/L   Chloride 86 (L) 101 - 111 mmol/L   CO2 24 22 - 32 mmol/L   Glucose, Bld 140 (H) 65 - 99 mg/dL   BUN 6 6 - 20 mg/dL   Creatinine, Ser 0.87 0.44 - 1.00 mg/dL   Calcium 7.9 (L) 8.9 - 10.3 mg/dL   GFR calc non Af Amer >60 >60 mL/min   GFR calc Af Amer >60 >60 mL/min    Comment: (NOTE) The eGFR has been calculated using the CKD EPI equation. This calculation has not been validated in all clinical situations. eGFR's persistently <60 mL/min signify possible Chronic Kidney Disease.    Anion gap 12 5 - 15  Lactic acid, plasma     Status: None   Collection Time: 01/20/16  2:13 PM  Result Value Ref Range   Lactic Acid, Venous 1.7 0.5 - 1.9 mmol/L  Basic metabolic panel     Status: Abnormal  Collection Time: 01/20/16  4:27 PM  Result Value Ref Range   Sodium 120 (L) 135 - 145 mmol/L   Potassium 3.3 (L) 3.5 - 5.1 mmol/L   Chloride 85 (L) 101 - 111 mmol/L   CO2 22 22 - 32 mmol/L   Glucose, Bld 144 (H) 65 - 99 mg/dL   BUN 6 6 - 20 mg/dL   Creatinine, Ser 5.04 0.44 - 1.00 mg/dL   Calcium 7.7 (L) 8.9 - 10.3 mg/dL   GFR calc non Af Amer >60 >60 mL/min   GFR calc Af Amer >60 >60 mL/min    Comment: (NOTE) The eGFR has been calculated using the CKD EPI equation. This calculation has not been validated in all clinical situations. eGFR's persistently <60 mL/min signify possible Chronic Kidney Disease.    Anion gap 13 5 - 15  Lactic acid, plasma     Status: None   Collection Time: 01/20/16  4:27 PM  Result Value Ref  Range   Lactic Acid, Venous 1.4 0.5 - 1.9 mmol/L  Protime-INR     Status: Abnormal   Collection Time: 01/20/16  4:27 PM  Result Value Ref Range   Prothrombin Time 16.0 (H) 11.4 - 15.2 seconds   INR 1.28   APTT     Status: None   Collection Time: 01/20/16  4:27 PM  Result Value Ref Range   aPTT 36 24 - 36 seconds  CBC     Status: Abnormal   Collection Time: 01/21/16  1:26 AM  Result Value Ref Range   WBC 26.2 (H) 4.0 - 10.5 K/uL    Comment: REPEATED TO VERIFY   RBC 3.53 (L) 3.87 - 5.11 MIL/uL   Hemoglobin 9.4 (L) 12.0 - 15.0 g/dL   HCT 13.6 (L) 43.8 - 37.7 %   MCV 78.8 78.0 - 100.0 fL   MCH 26.6 26.0 - 34.0 pg   MCHC 33.8 30.0 - 36.0 g/dL   RDW 93.9 68.8 - 64.8 %   Platelets 486 (H) 150 - 400 K/uL  Comprehensive metabolic panel     Status: Abnormal   Collection Time: 01/21/16  1:26 AM  Result Value Ref Range   Sodium 123 (L) 135 - 145 mmol/L   Potassium 3.1 (L) 3.5 - 5.1 mmol/L   Chloride 84 (L) 101 - 111 mmol/L   CO2 28 22 - 32 mmol/L   Glucose, Bld 137 (H) 65 - 99 mg/dL   BUN <5 (L) 6 - 20 mg/dL   Creatinine, Ser 4.72 0.44 - 1.00 mg/dL   Calcium 7.8 (L) 8.9 - 10.3 mg/dL   Total Protein 5.1 (L) 6.5 - 8.1 g/dL   Albumin 1.9 (L) 3.5 - 5.0 g/dL   AST 23 15 - 41 U/L   ALT 18 14 - 54 U/L   Alkaline Phosphatase 152 (H) 38 - 126 U/L   Total Bilirubin 0.8 0.3 - 1.2 mg/dL   GFR calc non Af Amer >60 >60 mL/min   GFR calc Af Amer >60 >60 mL/min    Comment: (NOTE) The eGFR has been calculated using the CKD EPI equation. This calculation has not been validated in all clinical situations. eGFR's persistently <60 mL/min signify possible Chronic Kidney Disease.    Anion gap 11 5 - 15   X-ray Chest Pa And Lateral  Result Date: 01/20/2016 CLINICAL DATA:  Pleural effusion. EXAM: CHEST  2 VIEW COMPARISON:  01/20/2016, earlier the same day.  12/19/2015. FINDINGS: Lower lung volumes with increased atelectasis at the right lung  base. Small right pleural effusion again noted. New  atelectasis seen at the left lung base on this lower volume film. Low lung volumes accentuate the cardiopericardial silhouette. Bones are diffusely demineralized. IMPRESSION: Low volume film with increased bibasilar atelectasis and small right pleural effusion. Electronically Signed   By: Misty Stanley M.D.   On: 01/20/2016 16:32   Dg Chest 2 View  Result Date: 01/20/2016 CLINICAL DATA:  Chest pain tonight. Multiple falls over the past week. EXAM: CHEST  2 VIEW COMPARISON:  Radiographs 12/19/2015.  CT 12/27/2015 FINDINGS: Again seen elevation of the right hemidiaphragm. Increased linear opacity in the adjacent right lung base, may be increased atelectasis or fluid in the fissure, suspect mild increase in right pleural effusion. Tiny left pleural effusion with blunting of the costophrenic angle. Unchanged heart size and aortic contour is allowing for differences in technique. Thoracic aortic atherosclerosis. Increased left basilar atelectasis. No pulmonary edema or pneumothorax. Compression deformity in the lower thoracic spine, as seen on CT. IMPRESSION: 1. Increased linear opacity adjacent the right lung base, may be increased atelectasis or fluid in the fissure. 2. Suspect increased in small right pleural effusion, development small left pleural effusion. Electronically Signed   By: Jeb Levering M.D.   On: 01/20/2016 05:59   [place] Chest  Result Date: 01/20/2016 CLINICAL DATA:  Suspected right pleural effusion. EXAM: CHEST ULTRASOUND COMPARISON:  Chest radiograph 01/20/2016 FINDINGS: Focused right chest wall ultrasound demonstrates small right subpulmonic effusion with maximum thickness of 12 mm, not amenable to thoracocentesis. IMPRESSION: Small right pleural effusion. Electronically Signed   By: Fidela Salisbury M.D.   On: 01/20/2016 11:36   Ct Abdomen Pelvis W Contrast  Result Date: 01/20/2016 CLINICAL DATA:  Liver abscess. EXAM: CT ABDOMEN AND PELVIS WITH CONTRAST TECHNIQUE: Multidetector CT  imaging of the abdomen and pelvis was performed using the standard protocol following bolus administration of intravenous contrast. CONTRAST:  100 mL Isovue 300 COMPARISON:  Chest CT 12/27/2015 FINDINGS: Lower chest: Band of atelectasis in the LEFT and RIGHT lower lobe. Small bilateral pleural effusions. Hepatobiliary: The inferior aspect of the RIGHT hepatic lobe is a 3.1 by 2.4 cm hypodense lesion with ill-defined margins which is decreased from 3.4 by 3.4 cm on prior. However, there is a complex fluid collection adjacent to RIGHT hepatic lobe which is increased in size compared to prior measuring 10.2 by 3.2 cm (image 24, series 201) increased from 5.7 by 2.2 cm. This perihepatic fluid collection has internal septations and gas locules. Fluid collections extend along the RIGHT retroperitoneal space into the iliac fossa. Collection measures 5.9 by 4.8 cm (image 46, series 201) at the level of the iliac crest. Fluid collection extends anterior to the iliacus muscle the level of the RIGHT external iliac artery measuring 4.4 by 3.2 cm this level (image 64, series 201). There is thickening along the lateral aspect of the the para renal fascia (image 30, series 201. The remaining of the liver parenchyma is normal. There multiple large gallstones within the lumen gallbladder ranging in size from 12 to 20 mm. The gallbladder distension. Common bile duct normal. Pancreas: Normal Spleen: Normal spleen Adrenals/urinary tract: There is ill-defined tissue associated the inferior aspect of the LEFT adrenal gland (image 26, series 201. RIGHT adrenal glands normal. The kidneys enhance symmetrically. Ureters appear normal. Bladder normal Stomach/Bowel: The stomach, small bowel normal. There is some thickening along the terminal ileum. There is thickening along the ascending colon just above the cecum. This is adjacent to the retroperitoneal  fluid collection / infection may be secondary inflammation. Vascular/Lymphatic:  Clear  calcification Reproductive: Uterus and ovaries normal. Other: No free fluid. Musculoskeletal: No aggressive osseous lesion. IMPRESSION: 1. Retroperitoneal fluid collection extends from the RIGHT iliac fossa to the RIGHT hepatic lobe margin consistent with large RETROPERITONEAL ABSCESS. There septation and gas collection in the most superior aspect adjacent to the liver with mass-effect on liver. Recommend percutaneous drainage and cytology of the fluid collection RIGHT lateral to the liver. 2. Presumed hepatic abscess in the most inferior RIGHT hepatic lobe is slightly smaller. 3. Ill-defined tissue associated with the LEFT adrenal gland is indeterminate. Cannot exclude malignancy. Recommend correlation with cytology as above. 4. Thicken along the perirenal fascia laterally adjacent to the RIGHT kidney. Differential includes infection or malignancy. 5. Thickening of the terminal ileum and ascending colon may be secondary to the retroperitoneal infection. 6. Cholelithiasis. These results will be called to the ordering clinician or representative by the Radiologist Assistant, and communication documented in the PACS or zVision Dashboard. Electronically Signed   By: Suzy Bouchard M.D.   On: 01/20/2016 16:29   Assessment/Plan Retroperitoneal fluid collection - WBC 26.2, LFT's/lactic acid/ WNL - blood cultures pending  - recommend percutaneous drainage by IR, cultures, and continue tx with abx - abx:  Vanc/Zosyn day #2  Liver lesion - malignant vs. abscess- tumor markers Acute on chronic hyponatremia  GERD - PPI HTN OA/Polymyalgia/fibromyalgia   Recommend percutaneous drainage, no need for surgical intervention at this time. We will follow along.   Jill Alexanders, [facility] Surgery 01/21/2016, 8:19 AM Pager: [phone] Consults: [phone] Mon-Fri 7:00 am-4:30 pm Sat-Sun 7:00 am-11:30 am

## 2016-01-21 NOTE — Sedation Documentation (Signed)
O2 d/c'd 

## 2016-01-21 NOTE — Progress Notes (Signed)
Result of serum osmo texted to Dr. Thedore MinsSingh.

## 2016-01-21 NOTE — Progress Notes (Signed)
PROGRESS NOTE                                                                                                                                                                                                             Patient Demographics:    Shannon Montgomery, is a 81 y.o. female, DOB - 07-10-1934, UXL:[id number]  Admit date - 01/20/2016   Admitting Physician Esperanza Sheets, MD  Outpatient Primary MD for the patient is Shannon Grippe, MD  LOS - 1  Chief Complaint  Patient presents with  . Fall       Brief Narrative  80 y/o female with PMH of Fibromyalgia, Questionable PMR, Hyponatremia, recently found to have liver lesions and right lung opacity on ct scan (8/3) took ciprofloxacin for 2 days as outpatient for possible pneumonia but did not follow up with ct scan of the ad/pelvis presented with weakness, tiredness and found to have hyponatremia with sodium 123. CT scan abdomen and pelvis this admission shows large retroperitoneal abscess, liver abscess and possible nonspecific changes in the right adrenal gland suspicious for malignancy.   Subjective:    Shannon Montgomery today has, No headache, No chest pain, No abdominal pain - No Nausea, No new weakness tingling or numbness, No Cough - SOB. I'll generalized weakness.   Assessment  & Plan :    1.Large retroperitoneal abscess, right lobe liver abscess. Nonspecific left adrenal gland changes. Blood cultures have been drawn, she will be continued on IV Zosyn, IR to put her abdominal drain for abscess drainage, surgery has been consulted, will await culture results, outpatient ID follow-up discussed her case with Dr. Orvan Falconer ID on 01/21/2016. Also review cytology on the drain fluid.  2. Hyponatremia. Her baseline seems to be close to 1:30, improving with gentle hydration which will be continued, check urine and serum osmolality, urine sodium was 41 which is intermediate.  3.  Osteoarthritis, fibromyalgia, questionable underlying PMR - supportive care.  4. GERD. On PPI.  5. Hypertension. Stable off medications.    Family Communication  : None present  Code Status :  Full  Diet : Healthy after IR places the drain  Disposition Plan  :  Stay inpatient  Consults  :  IR, general surgery, ID Dr. Orvan Falconer over the phone  Procedures  :    CT scan abdomen and pelvis  with large retroperitoneal abscess, possible liver abscess, nonspecific adrenal gland changes.    DVT Prophylaxis  :   Heparin    Lab Results  Component Value Date   PLT 486 (H) 01/21/2016    Inpatient Medications  Scheduled Meds: . aspirin EC  81 mg Oral Daily  . cholecalciferol  2,000 Units Oral Daily  . fluorometholone  1 drop Both Eyes BID  . pantoprazole  40 mg Oral Daily  . piperacillin-tazobactam (ZOSYN)  IV  3.375 g Intravenous Q8H  . potassium chloride  40 mEq Oral Q6H  . saccharomyces boulardii  250 mg Oral Daily   Continuous Infusions: . sodium chloride     PRN Meds:.acetaminophen, bisacodyl, celecoxib, magnesium citrate, metoCLOPramide, ondansetron **OR** ondansetron (ZOFRAN) IV, senna-docusate, traMADol, traZODone  Antibiotics  :    Anti-infectives    Start     Dose/Rate Route Frequency Ordered Stop   01/20/16 0900  piperacillin-tazobactam (ZOSYN) IVPB 3.375 g     3.375 g 12.5 mL/hr over 240 Minutes Intravenous Every 8 hours 01/20/16 0855     01/20/16 0900  vancomycin (VANCOCIN) IVPB 750 mg/150 ml premix  Status:  Discontinued     750 mg 150 mL/hr over 60 Minutes Intravenous Every 12 hours 01/20/16 0855 01/21/16 0955         Objective:   Vitals:   01/20/16 1804 01/20/16 2100 01/21/16 0500 01/21/16 0800  BP: (!) 130/59 132/68 138/72 (!) 156/77  Pulse: 100 98 (!) 102 98  Resp: 20 16 16 17   Temp: 98.1 F (36.7 C) 97.9 F (36.6 C) 99.5 F (37.5 C) 98.9 F (37.2 C)  TempSrc: Oral Oral Oral Oral  SpO2: 100% 98% 96% 97%  Weight:  79.8 kg (175 lb 14.8 oz)     Height:        Wt Readings from Last 3 Encounters:  01/20/16 79.8 kg (175 lb 14.8 oz)  12/19/15 82.3 kg (181 lb 6.4 oz)  06/08/13 90.7 kg (200 lb)     Intake/Output Summary (Last 24 hours) at 01/21/16 0956 Last data filed at 01/21/16 0900  Gross per 24 hour  Intake             2745 ml  Output             1675 ml  Net             1070 ml     Physical Exam  Awake Alert, Oriented X 3, No new F.N deficits, Normal affect Person.AT,PERRAL Supple Neck,No JVD, No cervical lymphadenopathy appriciated.  Symmetrical Chest wall movement, Good air movement bilaterally, CTAB RRR,No Gallops,Rubs or new Murmurs, No Parasternal Heave +ve B.Sounds, Abd Soft, No tenderness, No organomegaly appriciated, No rebound - guarding or rigidity. No Cyanosis, Clubbing or edema, No new Rash or bruise       Data Review:    CBC  Recent Labs Lab 01/20/16 0613 01/21/16 0126  WBC 27.0* 26.2*  HGB 9.5* 9.4*  HCT 28.7* 27.8*  PLT 533* 486*  MCV 79.7 78.8  MCH 26.4 26.6  MCHC 33.1 33.8  RDW 14.1 14.5  LYMPHSABS 0.5*  --   MONOABS 1.1*  --   EOSABS 0.0  --   BASOSABS 0.0  --     Chemistries   Recent Labs Lab 01/20/16 0439 01/20/16 0917 01/20/16 1627 01/21/16 0126  NA 123* 122* 120* 123*  K 3.5 3.5 3.3* 3.1*  CL 84* 86* 85* 84*  CO2 26 24 22 28   GLUCOSE 132* 140*  144* 137*  BUN 7 6 6  <5*  CREATININE 0.97 0.87 0.76 0.79  CALCIUM 8.1* 7.9* 7.7* 7.8*  AST 27  --   --  23  ALT 19  --   --  18  ALKPHOS 185*  --   --  152*  BILITOT 0.9  --   --  0.8   ------------------------------------------------------------------------------------------------------------------ No results for input(s): CHOL, HDL, LDLCALC, TRIG, CHOLHDL, LDLDIRECT in the last 72 hours.  No results found for: HGBA1C ------------------------------------------------------------------------------------------------------------------ No results for input(s): TSH, T4TOTAL, T3FREE, THYROIDAB in the last 72 hours.  Invalid  input(s): FREET3 ------------------------------------------------------------------------------------------------------------------ No results for input(s): VITAMINB12, FOLATE, FERRITIN, TIBC, IRON, RETICCTPCT in the last 72 hours.  Coagulation profile  Recent Labs Lab 01/20/16 1627  INR 1.28    No results for input(s): DDIMER in the last 72 hours.  Cardiac Enzymes No results for input(s): CKMB, TROPONINI, MYOGLOBIN in the last 168 hours.  Invalid input(s): CK ------------------------------------------------------------------------------------------------------------------ No results found for: BNP  Micro Results No results found for this or any previous visit (from the past 240 hour(s)).  Radiology Reports X-ray Chest Pa And Lateral  Result Date: 01/20/2016 CLINICAL DATA:  Pleural effusion. EXAM: CHEST  2 VIEW COMPARISON:  01/20/2016, earlier the same day.  12/19/2015. FINDINGS: Lower lung volumes with increased atelectasis at the right lung base. Small right pleural effusion again noted. New atelectasis seen at the left lung base on this lower volume film. Low lung volumes accentuate the cardiopericardial silhouette. Bones are diffusely demineralized. IMPRESSION: Low volume film with increased bibasilar atelectasis and small right pleural effusion. Electronically Signed   By: Kennith Center M.D.   On: 01/20/2016 16:32   Dg Chest 2 View  Result Date: 01/20/2016 CLINICAL DATA:  Chest pain tonight. Multiple falls over the past week. EXAM: CHEST  2 VIEW COMPARISON:  Radiographs 12/19/2015.  CT 12/27/2015 FINDINGS: Again seen elevation of the right hemidiaphragm. Increased linear opacity in the adjacent right lung base, may be increased atelectasis or fluid in the fissure, suspect mild increase in right pleural effusion. Tiny left pleural effusion with blunting of the costophrenic angle. Unchanged heart size and aortic contour is allowing for differences in technique. Thoracic aortic  atherosclerosis. Increased left basilar atelectasis. No pulmonary edema or pneumothorax. Compression deformity in the lower thoracic spine, as seen on CT. IMPRESSION: 1. Increased linear opacity adjacent the right lung base, may be increased atelectasis or fluid in the fissure. 2. Suspect increased in small right pleural effusion, development small left pleural effusion. Electronically Signed   By: Rubye Oaks M.D.   On: 01/20/2016 05:59   Ct Chest W Contrast  Result Date: 12/27/2015 CLINICAL DATA:  Small right pleural effusion questioned on chest x-ray, preop for total knee replacement EXAM: CT CHEST WITH CONTRAST TECHNIQUE: Multidetector CT imaging of the chest was performed during intravenous contrast administration. CONTRAST:  75mL ISOVUE-300 IOPAMIDOL (ISOVUE-300) INJECTION 61% COMPARISON:  Chest x-ray of 12/19/2015 FINDINGS: Cardiovascular: There is good opacification of the thoracic aorta. The mid ascending thoracic aorta measures 36 mm in diameter. The pulmonary arteries are opacified as well with no central abnormality evident. Coronary artery calcifications are present in the distribution of the left anterior descending coronary artery. No pericardial effusion is seen. Moderate thoracic aortic atherosclerosis is present. Mediastinum/Nodes: No mediastinal or hilar adenopathy is noted. The thyroid gland is unremarkable. Lungs/Pleura: On lung window images, only a tiny right pleural effusion is present. The majority the opacity at the posterior right lung base appears to be  due to atelectasis involving both the right lower lobe and the medial right middle lobe possibly as result of chronic elevation of the right hemidiaphragm. An inflammatory process cannot be excluded, but I would favor this being a chronic process. The left lung is clear other than mild linear scarring in the posterior left lower lobe. The central bronchi are patent. No suspicious lung nodule or mass is seen. Upper Abdomen: However,  there is a definite abnormality within the posterior aspect of the right lobe of liver. On axial image 116 series 4 there is a rounded lesion in the posterior right lobe of liver of 5.0 x 4.8 cm with probable adjacent fluid. More caudally there is a lower attenuation portion of this lesion measuring 3.4 x 3.5 cm with attenuation of 15 HU. Immediately adjacent extending along the posterior capsule of the right posterior lobe of liver there is a collection of fluid or soft tissue measuring 17 HU extending into the right retroperitoneum. This collection also appears to invade the adjacent fat planes and possibly the right lower posterolateral chest wall. Although these changes could be inflammatory in nature, a neoplastic process is a definite consideration. CT of the abdomen pelvis with oral and IV contrast media is recommended. Incidental gallstones also are noted within the gallbladder, the largest of 2.9 cm. No gallbladder wall thickening is seen. A few minimally prominent lymph nodes are present near the porta hepatis. Musculoskeletal: On the sagittal view there appears to be compression deformity of T11 and L2 of uncertain acuity. Also there is irregularity of the sternum most likely due to prior fracture with healing. Clinical correlation is recommended. IMPRESSION: 1. Abnormality within the posterior right lobe of liver with adjacent fluid and/or soft tissue extending more caudally. Some of this soft tissue does appear to invade the fat planes of the adjacent right posterolateral chest wall. Although these changes could be inflammatory, a neoplastic process is a definite consideration. Recommend CT of the abdomen pelvis with oral and IV contrast media. 2. Incidental gallstones. 3. Opacity at the right lung base is most likely due to chronic atelectasis with elevation of the right hemidiaphragm also involving the medial right middle lobe. Only a tiny right pleural effusion is seen. This opacity may also be in  part due to the process involving the posterior right lobe of liver as described above. 4. Partial compression deformity of T11 and possibly L2 of uncertain acuity. 5. Probable old fracture of the sternum.  Correlate clinically. 6. Moderate thoracic aortic atherosclerosis. Electronically Signed   By: Dwyane DeePaul  Barry M.D.   On: 12/27/2015 16:24   [place] Chest  Result Date: 01/20/2016 CLINICAL DATA:  Suspected right pleural effusion. EXAM: CHEST ULTRASOUND COMPARISON:  Chest radiograph 01/20/2016 FINDINGS: Focused right chest wall ultrasound demonstrates small right subpulmonic effusion with maximum thickness of 12 mm, not amenable to thoracocentesis. IMPRESSION: Small right pleural effusion. Electronically Signed   By: Ted Mcalpineobrinka  Dimitrova M.D.   On: 01/20/2016 11:36   Ct Abdomen Pelvis W Contrast  Result Date: 01/20/2016 CLINICAL DATA:  Liver abscess. EXAM: CT ABDOMEN AND PELVIS WITH CONTRAST TECHNIQUE: Multidetector CT imaging of the abdomen and pelvis was performed using the standard protocol following bolus administration of intravenous contrast. CONTRAST:  100 mL Isovue 300 COMPARISON:  Chest CT 12/27/2015 FINDINGS: Lower chest: Band of atelectasis in the LEFT and RIGHT lower lobe. Small bilateral pleural effusions. Hepatobiliary: The inferior aspect of the RIGHT hepatic lobe is a 3.1 by 2.4 cm hypodense lesion with ill-defined margins  which is decreased from 3.4 by 3.4 cm on prior. However, there is a complex fluid collection adjacent to RIGHT hepatic lobe which is increased in size compared to prior measuring 10.2 by 3.2 cm (image 24, series 201) increased from 5.7 by 2.2 cm. This perihepatic fluid collection has internal septations and gas locules. Fluid collections extend along the RIGHT retroperitoneal space into the iliac fossa. Collection measures 5.9 by 4.8 cm (image 46, series 201) at the level of the iliac crest. Fluid collection extends anterior to the iliacus muscle the level of the RIGHT external  iliac artery measuring 4.4 by 3.2 cm this level (image 64, series 201). There is thickening along the lateral aspect of the the para renal fascia (image 30, series 201. The remaining of the liver parenchyma is normal. There multiple large gallstones within the lumen gallbladder ranging in size from 12 to 20 mm. The gallbladder distension. Common bile duct normal. Pancreas: Normal Spleen: Normal spleen Adrenals/urinary tract: There is ill-defined tissue associated the inferior aspect of the LEFT adrenal gland (image 26, series 201. RIGHT adrenal glands normal. The kidneys enhance symmetrically. Ureters appear normal. Bladder normal Stomach/Bowel: The stomach, small bowel normal. There is some thickening along the terminal ileum. There is thickening along the ascending colon just above the cecum. This is adjacent to the retroperitoneal fluid collection / infection may be secondary inflammation. Vascular/Lymphatic:  Clear calcification Reproductive: Uterus and ovaries normal. Other: No free fluid. Musculoskeletal: No aggressive osseous lesion. IMPRESSION: 1. Retroperitoneal fluid collection extends from the RIGHT iliac fossa to the RIGHT hepatic lobe margin consistent with large RETROPERITONEAL ABSCESS. There septation and gas collection in the most superior aspect adjacent to the liver with mass-effect on liver. Recommend percutaneous drainage and cytology of the fluid collection RIGHT lateral to the liver. 2. Presumed hepatic abscess in the most inferior RIGHT hepatic lobe is slightly smaller. 3. Ill-defined tissue associated with the LEFT adrenal gland is indeterminate. Cannot exclude malignancy. Recommend correlation with cytology as above. 4. Thicken along the perirenal fascia laterally adjacent to the RIGHT kidney. Differential includes infection or malignancy. 5. Thickening of the terminal ileum and ascending colon may be secondary to the retroperitoneal infection. 6. Cholelithiasis. These results will be called  to the ordering clinician or representative by the Radiologist Assistant, and communication documented in the PACS or zVision Dashboard. Electronically Signed   By: Genevive Bi M.D.   On: 01/20/2016 16:29    Time Spent in minutes  30   SINGH,PRASHANT K M.D on 01/21/2016 at 9:56 AM  Between 7am to 7pm - Pager - [phone]  After 7pm go to [email or web address] - password [facility]  [facility] -  Office  [phone]

## 2016-01-21 NOTE — Sedation Documentation (Signed)
Moved to CT 3, assisted to table

## 2016-01-21 NOTE — Evaluation (Signed)
Occupational Therapy Evaluation Patient Details Name: Shannon Montgomery MRN: [id number] DOB: May 07, 1935 Today's Date: 01/21/2016    History of Present Illness Pt admitted with hyponatremia after falling at home. Pt with poor appetite and dehydration. PMH: recent dx of liver lesions and R lung opacity as she was preparing for a R TKA, fibromyalgia, polymyalgia rheumatica.   Clinical Impression   Pt was independent prior to admission. Presents with generalized weakness and required min guard assist with RW for OOB mobility. Will follow acutely. Recommending PT consult as well. Do not anticipate pt will require post acute OT.    Follow Up Recommendations  No OT follow up    Equipment Recommendations   (Rolling walker)    Recommendations for Other Services  PT     Precautions / Restrictions Precautions Precautions: Fall      Mobility Bed Mobility Overal bed mobility: Modified Independent             General bed mobility comments: increased time, no physical assist or use of rail, bed nearly flat  Transfers Overall transfer level: Needs assistance Equipment used: Rolling walker (2 wheeled) Transfers: Sit to/from Stand Sit to Stand: Supervision         General transfer comment: cues for hand placement with walker    Balance                                            ADL Overall ADL's : Needs assistance/impaired Eating/Feeding: Independent;Sitting   Grooming: Wash/dry hands;Standing;Min guard   Upper Body Bathing: Set up;Sitting   Lower Body Bathing: Min guard;Sit to/from stand   Upper Body Dressing : Set up;Sitting   Lower Body Dressing: Min guard;Sit to/from stand Lower Body Dressing Details (indicate cue type and reason): pt does not wear socks, wears slip on shoes Toilet Transfer: Min guard;Ambulation;BSC;RW   Toileting- Architect and Hygiene: Min guard;Sit to/from stand       Functional mobility during ADLs: Min  guard;Rolling walker General ADL Comments: Educated pt in use of 3 in 1 as shower seat and to elevate her standard toilet.     Vision Additional Comments: hx of corneal transplant   Perception     Praxis      Pertinent Vitals/Pain Pain Assessment: No/denies pain     Hand Dominance Right   Extremity/Trunk Assessment Upper Extremity Assessment Upper Extremity Assessment: Generalized weakness   Lower Extremity Assessment Lower Extremity Assessment: Generalized weakness       Communication Communication Communication: No difficulties   Cognition Arousal/Alertness: Awake/alert Behavior During Therapy: WFL for tasks assessed/performed                       General Comments       Exercises       Shoulder Instructions      Home Living Family/patient expects to be discharged to:: Private residence Living Arrangements: Spouse/significant other Available Help at Discharge: Family;Available 24 hours/day Type of Home: House Home Access: Stairs to enter [employer] of Steps: 6 Entrance Stairs-Rails: Right Home Layout: Two level;Able to live on main level with bedroom/bathroom     Bathroom Shower/Tub: Producer, television/film/video: Standard     Home Equipment: Bedside commode;Hand held shower head   Additional Comments: pt uses BSC at night      Prior Functioning/Environment Level of Independence: Independent  Comments: doesn't drive due to vision    OT Diagnosis: Generalized weakness   OT Problem List: Decreased strength;Decreased activity tolerance;Impaired balance (sitting and/or standing);Decreased knowledge of use of DME or AE   OT Treatment/Interventions: Self-care/ADL training;DME and/or AE instruction;Patient/family education    OT Goals(Current goals can be found in the care plan section) Acute Rehab OT Goals Patient Stated Goal: to return home, get stronger OT Goal Formulation: With patient Time For Goal  Achievement: 01/28/16 Potential to Achieve Goals: Good ADL Goals Pt Will Perform Grooming: with modified independence;standing Pt Will Perform Upper Body Bathing: sitting;with modified independence Pt Will Perform Lower Body Bathing: with modified independence;sit to/from stand Pt Will Transfer to Toilet: with modified independence;ambulating;bedside commode (over toilet) Pt Will Perform Toileting - Clothing Manipulation and hygiene: with modified independence;sit to/from stand Pt Will Perform Tub/Shower Transfer: Shower transfer;with supervision;ambulating;3 in 1;rolling walker Additional ADL Goal #1: Pt will gather items necessary for ADL with RW independently.  OT Frequency: Min 2X/week   Barriers to D/C:            Co-evaluation              End of Session Equipment Utilized During Treatment: Rolling walker;Gait belt  Activity Tolerance: Patient limited by fatigue Patient left: in bed;with call bell/phone within reach   Time: 1256-1319 OT Time Calculation (min): 23 min Charges:  OT General Charges $OT Visit: 1 Procedure OT Evaluation $OT Eval Moderate Complexity: 1 Procedure OT Treatments $Self Care/Home Management : 8-22 mins G-Codes:    Evern BioMayberry, Izsak Meir Lynn 01/21/2016, 1:56 PM  [phone]

## 2016-01-22 LAB — BASIC METABOLIC PANEL
Anion gap: 8 (ref 5–15)
BUN: 8 mg/dL (ref 6–20)
CHLORIDE: 89 mmol/L — AB (ref 101–111)
CO2: 27 mmol/L (ref 22–32)
CREATININE: 0.88 mg/dL (ref 0.44–1.00)
Calcium: 7.6 mg/dL — ABNORMAL LOW (ref 8.9–10.3)
GFR calc Af Amer: >60 mL/min (ref >60)
Glucose, Bld: 126 mg/dL — ABNORMAL HIGH (ref 65–99)
Potassium: 3.5 mmol/L (ref 3.5–5.1)
SODIUM: 124 mmol/L — AB (ref 135–145)

## 2016-01-22 LAB — CBC
HCT: 29.3 % — ABNORMAL LOW (ref 36.0–46.0)
Hemoglobin: 9.6 g/dL — ABNORMAL LOW (ref 12.0–15.0)
MCH: 26.3 pg (ref 26.0–34.0)
MCHC: 32.8 g/dL (ref 30.0–36.0)
MCV: 80.3 fL (ref 78.0–100.0)
PLATELETS: 477 10*3/uL — AB (ref 150–400)
RBC: 3.65 MIL/uL — ABNORMAL LOW (ref 3.87–5.11)
RDW: 14.2 % (ref 11.5–15.5)
WBC: 21 10*3/uL — ABNORMAL HIGH (ref 4.0–10.5)

## 2016-01-22 LAB — MAGNESIUM: MAGNESIUM: 1.2 mg/dL — AB (ref 1.7–2.4)

## 2016-01-22 MED ORDER — MAGNESIUM SULFATE 2 GM/50ML IV SOLN
2.0000 g | Freq: Once | INTRAVENOUS | Status: AC
  Administered 2016-01-22: 2 g via INTRAVENOUS
  Filled 2016-01-22: qty 50

## 2016-01-22 MED ORDER — FUROSEMIDE 10 MG/ML IJ SOLN
20.0000 mg | Freq: Once | INTRAMUSCULAR | Status: AC
  Administered 2016-01-22: 20 mg via INTRAVENOUS
  Filled 2016-01-22: qty 2

## 2016-01-22 NOTE — Progress Notes (Signed)
Subjective: Pt doing well.  Feels better  Objective: Vital signs in last 24 hours: Temp:  [97.7 F (36.5 C)-98 F (36.7 C)] 97.9 F (36.6 C) (08/29 0801) Pulse Rate:  [78-103] 88 (08/29 0801) Resp:  [16-22] 17 (08/29 0801) BP: (122-147)/(64-99) 128/79 (08/29 0801) SpO2:  [96 %-100 %] 97 % (08/29 0801) Weight:  [82.1 kg (181 lb 1.6 oz)] 82.1 kg (181 lb 1.6 oz) (08/28 2130) Last BM Date: 01/21/16  Intake/Output from previous day: 08/28 0701 - 08/29 0700 In: 2308.8 [P.O.:700; I.V.:1508.8; IV Piggyback:100] Out: 1238 [Urine:1050; Drains:188] Intake/Output this shift: Total I/O In: 360 [P.O.:360] Out: 100 [Drains:100]  General appearance: alert and cooperative GI: soft, non-tender; bowel sounds normal; no masses,  no organomegaly  Lab Results:   Recent Labs  01/21/16 0126 01/22/16 0951  WBC 26.2* 21.0*  HGB 9.4* 9.6*  HCT 27.8* 29.3*  PLT 486* 477*   BMET  Recent Labs  01/21/16 0126 01/22/16 0951  NA 123* 124*  K 3.1* 3.5  CL 84* 89*  CO2 28 27  GLUCOSE 137* 126*  BUN <5* 8  CREATININE 0.79 0.88  CALCIUM 7.8* 7.6*   PT/INR  Recent Labs  01/20/16 1627  LABPROT 16.0*  INR 1.28   ABG No results for input(s): PHART, HCO3 in the last 72 hours.  Invalid input(s): PCO2, PO2  Studies/Results: X-ray Chest Pa And Lateral  Result Date: 01/20/2016 CLINICAL DATA:  Pleural effusion. EXAM: CHEST  2 VIEW COMPARISON:  01/20/2016, earlier the same day.  12/19/2015. FINDINGS: Lower lung volumes with increased atelectasis at the right lung base. Small right pleural effusion again noted. New atelectasis seen at the left lung base on this lower volume film. Low lung volumes accentuate the cardiopericardial silhouette. Bones are diffusely demineralized. IMPRESSION: Low volume film with increased bibasilar atelectasis and small right pleural effusion. Electronically Signed   By: Kennith Center M.D.   On: 01/20/2016 16:32   [place] Chest  Result Date: 01/20/2016 CLINICAL  DATA:  Suspected right pleural effusion. EXAM: CHEST ULTRASOUND COMPARISON:  Chest radiograph 01/20/2016 FINDINGS: Focused right chest wall ultrasound demonstrates small right subpulmonic effusion with maximum thickness of 12 mm, not amenable to thoracocentesis. IMPRESSION: Small right pleural effusion. Electronically Signed   By: Ted Mcalpine M.D.   On: 01/20/2016 11:36   Ct Abdomen Pelvis W Contrast  Result Date: 01/20/2016 CLINICAL DATA:  Liver abscess. EXAM: CT ABDOMEN AND PELVIS WITH CONTRAST TECHNIQUE: Multidetector CT imaging of the abdomen and pelvis was performed using the standard protocol following bolus administration of intravenous contrast. CONTRAST:  100 mL Isovue 300 COMPARISON:  Chest CT 12/27/2015 FINDINGS: Lower chest: Band of atelectasis in the LEFT and RIGHT lower lobe. Small bilateral pleural effusions. Hepatobiliary: The inferior aspect of the RIGHT hepatic lobe is a 3.1 by 2.4 cm hypodense lesion with ill-defined margins which is decreased from 3.4 by 3.4 cm on prior. However, there is a complex fluid collection adjacent to RIGHT hepatic lobe which is increased in size compared to prior measuring 10.2 by 3.2 cm (image 24, series 201) increased from 5.7 by 2.2 cm. This perihepatic fluid collection has internal septations and gas locules. Fluid collections extend along the RIGHT retroperitoneal space into the iliac fossa. Collection measures 5.9 by 4.8 cm (image 46, series 201) at the level of the iliac crest. Fluid collection extends anterior to the iliacus muscle the level of the RIGHT external iliac artery measuring 4.4 by 3.2 cm this level (image 64, series 201). There is thickening along  the lateral aspect of the the para renal fascia (image 30, series 201. The remaining of the liver parenchyma is normal. There multiple large gallstones within the lumen gallbladder ranging in size from 12 to 20 mm. The gallbladder distension. Common bile duct normal. Pancreas: Normal Spleen:  Normal spleen Adrenals/urinary tract: There is ill-defined tissue associated the inferior aspect of the LEFT adrenal gland (image 26, series 201. RIGHT adrenal glands normal. The kidneys enhance symmetrically. Ureters appear normal. Bladder normal Stomach/Bowel: The stomach, small bowel normal. There is some thickening along the terminal ileum. There is thickening along the ascending colon just above the cecum. This is adjacent to the retroperitoneal fluid collection / infection may be secondary inflammation. Vascular/Lymphatic:  Clear calcification Reproductive: Uterus and ovaries normal. Other: No free fluid. Musculoskeletal: No aggressive osseous lesion. IMPRESSION: 1. Retroperitoneal fluid collection extends from the RIGHT iliac fossa to the RIGHT hepatic lobe margin consistent with large RETROPERITONEAL ABSCESS. There septation and gas collection in the most superior aspect adjacent to the liver with mass-effect on liver. Recommend percutaneous drainage and cytology of the fluid collection RIGHT lateral to the liver. 2. Presumed hepatic abscess in the most inferior RIGHT hepatic lobe is slightly smaller. 3. Ill-defined tissue associated with the LEFT adrenal gland is indeterminate. Cannot exclude malignancy. Recommend correlation with cytology as above. 4. Thicken along the perirenal fascia laterally adjacent to the RIGHT kidney. Differential includes infection or malignancy. 5. Thickening of the terminal ileum and ascending colon may be secondary to the retroperitoneal infection. 6. Cholelithiasis. These results will be called to the ordering clinician or representative by the Radiologist Assistant, and communication documented in the PACS or zVision Dashboard. Electronically Signed   By: Genevive Bi M.D.   On: 01/20/2016 16:29   Ct Image Guided Drainage By Percutaneous Catheter  Result Date: 01/21/2016 CLINICAL DATA:  Right-sided retroperitoneal abscess extending superiorly to abut the liver. The  patient presents for percutaneous drainage. EXAM: CT GUIDED DRAINAGE OF RETROPERITONEAL ABSCESS ANESTHESIA/SEDATION: 1.0 Mg IV Versed 50 mcg IV Fentanyl Total Moderate Sedation Time:  26 minutes The patient's level of consciousness and physiologic status were continuously monitored during the procedure by Radiology nursing. PROCEDURE: The procedure, risks, benefits, and alternatives were explained to the patient. Questions regarding the procedure were encouraged and answered. The patient understands and consents to the procedure. A time-out was performed prior to initiating the procedure. The right abdominal wall was prepped with chlorhexidine in a sterile fashion, and a sterile drape was applied covering the operative field. A sterile gown and sterile gloves were used for the procedure. Local anesthesia was provided with 1% Lidocaine. Imaging through the abdomen was performed in a supine position. Under CT guidance, an 18 gauge needle was advanced into a perihepatic abscess. A guidewire was advanced through the needle. The percutaneous tract was dilated and a 12 French pigtail drainage catheter placed. Fluid aspiration was performed with a sample sent for culture analysis. The catheter was flushed and connected to a suction bulb. The catheter was secured at skin with a Prolene retention suture and adhesive StatLock device. COMPLICATIONS: None FINDINGS: The lower perihepatic component of an elongated and predominantly retroperitoneal abscess yielded purulent fluid. After drainage catheter placement, there is good return of purulent fluid. IMPRESSION: CT-guided percutaneous drainage of abscess adjacent to the inferior liver. Grossly purulent fluid return noted with a sample sent for culture analysis. A 12 French drain was placed and attached to suction bulb drainage. Electronically Signed   By: Rudene Anda.D.  On: 01/21/2016 16:26    Anti-infectives: Anti-infectives    Start     Dose/Rate Route Frequency  Ordered Stop   01/20/16 0900  piperacillin-tazobactam (ZOSYN) IVPB 3.375 g     3.375 g 12.5 mL/hr over 240 Minutes Intravenous Every 8 hours 01/20/16 0855     01/20/16 0900  vancomycin (VANCOCIN) IVPB 750 mg/150 ml premix  Status:  Discontinued     750 mg 150 mL/hr over 60 Minutes Intravenous Every 12 hours 01/20/16 0855 01/21/16 0955      Assessment/Plan: 81 y/o F with RP abscess s/p drain WBC improving Con't drain No role at this time for surgery.  LOS: 2 days    Marigene EhlersRamirez Jr., [facility] 01/22/2016

## 2016-01-22 NOTE — Progress Notes (Signed)
Patient ID: Shannon Montgomery, female   DOB: Sep 24, 1934, 81 y.o.   MRN: [id number]    Referring Physician(s): Dr. Axel Filler  Supervising Physician: Simonne Come  Patient Status: inpt  Chief Complaint: RTP abscess  Subjective: Patient feels weak, but otherwise is feeling ok.  No pain in her abdomen  Allergies: Ciprofloxacin; Doxycycline; Erythromycin; and Hepatitis a vaccine  Medications: Prior to Admission medications   Medication Sig Start Date End Date Taking? Authorizing Provider  acetaminophen (TYLENOL) 325 MG tablet Take 650 mg by mouth every 6 (six) hours as needed for mild pain or moderate pain.   Yes Historical Provider, MD  aspirin EC 81 MG tablet Take 81 mg by mouth daily.   Yes Historical Provider, MD  Calcium-Magnesium-Zinc [phone] MG TABS Take 1 tablet by mouth daily.   Yes Historical Provider, MD  celecoxib (CELEBREX) 200 MG capsule Take 200 mg by mouth 2 (two) times daily as needed for mild pain or moderate pain.   Yes Historical Provider, MD  Cholecalciferol 2000 UNITS CAPS Take 1 capsule by mouth daily.   Yes Historical Provider, MD  co-enzyme Q-10 30 MG capsule Take 30 mg by mouth 2 (two) times a week.    Yes Historical Provider, MD  CYANOCOBALAMIN PO Take 1 tablet by mouth daily.   Yes Historical Provider, MD  esomeprazole (NEXIUM) 20 MG capsule Take 20 mg by mouth daily as needed (reflux).    Yes Historical Provider, MD  fluorometholone (FML) 0.1 % ophthalmic suspension Place 1 drop into both eyes 2 (two) times daily. 11/05/15  Yes Historical Provider, MD  KRILL OIL ULTRA STRENGTH PO Take 1 capsule by mouth daily.   Yes Historical Provider, MD  metoCLOPramide (REGLAN) 5 MG tablet Take 5 mg by mouth 3 (three) times daily as needed. 11/06/15  Yes Historical Provider, MD  Multiple Vitamin (MULTIVITAMIN WITH MINERALS) TABS tablet Take 1 tablet by mouth daily.   Yes Historical Provider, MD  Probiotic Product (TRUNATURE DIGESTIVE PROBIOTIC) CAPS Take 1 capsule by  mouth daily.   Yes Historical Provider, MD  sodium chloride (OCEAN) 0.65 % SOLN nasal spray Place 1 spray into both nostrils as needed for congestion.   Yes Historical Provider, MD  traMADol (ULTRAM) 50 MG tablet Take 1 tablet by mouth 2 (two) times daily. 12/26/15  Yes Historical Provider, MD  vitamin C (ASCORBIC ACID) 500 MG tablet Take 500 mg by mouth daily.   Yes Historical Provider, MD    Vital Signs: BP 128/79 (BP Location: Left Arm)   Pulse 88   Temp 97.9 F (36.6 C) (Oral)   Resp 17   Ht 5\' 5"  (1.651 m)   Wt 181 lb 1.6 oz (82.1 kg)   SpO2 97%   BMI 30.14 kg/m   Physical Exam: Abd: soft, drain site is c/d/i.  Drain bulb is full of purulent drainage. 130cc/24 hrs.  About 80cc present right now, just since this morning.    Imaging: X-ray Chest Pa And Lateral  Result Date: 01/20/2016 CLINICAL DATA:  Pleural effusion. EXAM: CHEST  2 VIEW COMPARISON:  01/20/2016, earlier the same day.  12/19/2015. FINDINGS: Lower lung volumes with increased atelectasis at the right lung base. Small right pleural effusion again noted. New atelectasis seen at the left lung base on this lower volume film. Low lung volumes accentuate the cardiopericardial silhouette. Bones are diffusely demineralized. IMPRESSION: Low volume film with increased bibasilar atelectasis and small right pleural effusion. Electronically Signed   By: Jamison Oka.D.  On: 01/20/2016 16:32   Dg Chest 2 View  Result Date: 01/20/2016 CLINICAL DATA:  Chest pain tonight. Multiple falls over the past week. EXAM: CHEST  2 VIEW COMPARISON:  Radiographs 12/19/2015.  CT 12/27/2015 FINDINGS: Again seen elevation of the right hemidiaphragm. Increased linear opacity in the adjacent right lung base, may be increased atelectasis or fluid in the fissure, suspect mild increase in right pleural effusion. Tiny left pleural effusion with blunting of the costophrenic angle. Unchanged heart size and aortic contour is allowing for differences in  technique. Thoracic aortic atherosclerosis. Increased left basilar atelectasis. No pulmonary edema or pneumothorax. Compression deformity in the lower thoracic spine, as seen on CT. IMPRESSION: 1. Increased linear opacity adjacent the right lung base, may be increased atelectasis or fluid in the fissure. 2. Suspect increased in small right pleural effusion, development small left pleural effusion. Electronically Signed   By: Rubye OaksMelanie  Ehinger M.D.   On: 01/20/2016 05:59   [place] Chest  Result Date: 01/20/2016 CLINICAL DATA:  Suspected right pleural effusion. EXAM: CHEST ULTRASOUND COMPARISON:  Chest radiograph 01/20/2016 FINDINGS: Focused right chest wall ultrasound demonstrates small right subpulmonic effusion with maximum thickness of 12 mm, not amenable to thoracocentesis. IMPRESSION: Small right pleural effusion. Electronically Signed   By: Ted Mcalpineobrinka  Dimitrova M.D.   On: 01/20/2016 11:36   Ct Abdomen Pelvis W Contrast  Result Date: 01/20/2016 CLINICAL DATA:  Liver abscess. EXAM: CT ABDOMEN AND PELVIS WITH CONTRAST TECHNIQUE: Multidetector CT imaging of the abdomen and pelvis was performed using the standard protocol following bolus administration of intravenous contrast. CONTRAST:  100 mL Isovue 300 COMPARISON:  Chest CT 12/27/2015 FINDINGS: Lower chest: Band of atelectasis in the LEFT and RIGHT lower lobe. Small bilateral pleural effusions. Hepatobiliary: The inferior aspect of the RIGHT hepatic lobe is a 3.1 by 2.4 cm hypodense lesion with ill-defined margins which is decreased from 3.4 by 3.4 cm on prior. However, there is a complex fluid collection adjacent to RIGHT hepatic lobe which is increased in size compared to prior measuring 10.2 by 3.2 cm (image 24, series 201) increased from 5.7 by 2.2 cm. This perihepatic fluid collection has internal septations and gas locules. Fluid collections extend along the RIGHT retroperitoneal space into the iliac fossa. Collection measures 5.9 by 4.8 cm (image 46,  series 201) at the level of the iliac crest. Fluid collection extends anterior to the iliacus muscle the level of the RIGHT external iliac artery measuring 4.4 by 3.2 cm this level (image 64, series 201). There is thickening along the lateral aspect of the the para renal fascia (image 30, series 201. The remaining of the liver parenchyma is normal. There multiple large gallstones within the lumen gallbladder ranging in size from 12 to 20 mm. The gallbladder distension. Common bile duct normal. Pancreas: Normal Spleen: Normal spleen Adrenals/urinary tract: There is ill-defined tissue associated the inferior aspect of the LEFT adrenal gland (image 26, series 201. RIGHT adrenal glands normal. The kidneys enhance symmetrically. Ureters appear normal. Bladder normal Stomach/Bowel: The stomach, small bowel normal. There is some thickening along the terminal ileum. There is thickening along the ascending colon just above the cecum. This is adjacent to the retroperitoneal fluid collection / infection may be secondary inflammation. Vascular/Lymphatic:  Clear calcification Reproductive: Uterus and ovaries normal. Other: No free fluid. Musculoskeletal: No aggressive osseous lesion. IMPRESSION: 1. Retroperitoneal fluid collection extends from the RIGHT iliac fossa to the RIGHT hepatic lobe margin consistent with large RETROPERITONEAL ABSCESS. There septation and gas collection in the  most superior aspect adjacent to the liver with mass-effect on liver. Recommend percutaneous drainage and cytology of the fluid collection RIGHT lateral to the liver. 2. Presumed hepatic abscess in the most inferior RIGHT hepatic lobe is slightly smaller. 3. Ill-defined tissue associated with the LEFT adrenal gland is indeterminate. Cannot exclude malignancy. Recommend correlation with cytology as above. 4. Thicken along the perirenal fascia laterally adjacent to the RIGHT kidney. Differential includes infection or malignancy. 5. Thickening of the  terminal ileum and ascending colon may be secondary to the retroperitoneal infection. 6. Cholelithiasis. These results will be called to the ordering clinician or representative by the Radiologist Assistant, and communication documented in the PACS or zVision Dashboard. Electronically Signed   By: Genevive Bi M.D.   On: 01/20/2016 16:29   Ct Image Guided Drainage By Percutaneous Catheter  Result Date: 01/21/2016 CLINICAL DATA:  Right-sided retroperitoneal abscess extending superiorly to abut the liver. The patient presents for percutaneous drainage. EXAM: CT GUIDED DRAINAGE OF RETROPERITONEAL ABSCESS ANESTHESIA/SEDATION: 1.0 Mg IV Versed 50 mcg IV Fentanyl Total Moderate Sedation Time:  26 minutes The patient's level of consciousness and physiologic status were continuously monitored during the procedure by Radiology nursing. PROCEDURE: The procedure, risks, benefits, and alternatives were explained to the patient. Questions regarding the procedure were encouraged and answered. The patient understands and consents to the procedure. A time-out was performed prior to initiating the procedure. The right abdominal wall was prepped with chlorhexidine in a sterile fashion, and a sterile drape was applied covering the operative field. A sterile gown and sterile gloves were used for the procedure. Local anesthesia was provided with 1% Lidocaine. Imaging through the abdomen was performed in a supine position. Under CT guidance, an 18 gauge needle was advanced into a perihepatic abscess. A guidewire was advanced through the needle. The percutaneous tract was dilated and a 12 French pigtail drainage catheter placed. Fluid aspiration was performed with a sample sent for culture analysis. The catheter was flushed and connected to a suction bulb. The catheter was secured at skin with a Prolene retention suture and adhesive StatLock device. COMPLICATIONS: None FINDINGS: The lower perihepatic component of an elongated and  predominantly retroperitoneal abscess yielded purulent fluid. After drainage catheter placement, there is good return of purulent fluid. IMPRESSION: CT-guided percutaneous drainage of abscess adjacent to the inferior liver. Grossly purulent fluid return noted with a sample sent for culture analysis. A 12 French drain was placed and attached to suction bulb drainage. Electronically Signed   By: Irish Lack M.D.   On: 01/21/2016 16:26    Labs:  CBC:  Recent Labs  12/19/15 1154 01/20/16 0613 01/21/16 0126  WBC 9.3 27.0* 26.2*  HGB 11.7* 9.5* 9.4*  HCT 35.4* 28.7* 27.8*  PLT 358 533* 486*    COAGS:  Recent Labs  12/19/15 1154 01/20/16 1627  INR 1.03 1.28  APTT 34 36    BMP:  Recent Labs  01/20/16 0439 01/20/16 0917 01/20/16 1627 01/21/16 0126  NA 123* 122* 120* 123*  K 3.5 3.5 3.3* 3.1*  CL 84* 86* 85* 84*  CO2 26 24 22 28   GLUCOSE 132* 140* 144* 137*  BUN 7 6 6  <5*  CALCIUM 8.1* 7.9* 7.7* 7.8*  CREATININE 0.97 0.87 0.76 0.79  GFRNONAA 54* >60 >60 >60  GFRAA >60 >60 >60 >60    LIVER FUNCTION TESTS:  Recent Labs  01/20/16 0439 01/21/16 0126  BILITOT 0.9 0.8  AST 27 23  ALT 19 18  ALKPHOS 185* 152*  PROT 6.0* 5.1*  ALBUMIN 2.2* 1.9*    Assessment and Plan: 1. RTP abscess, s/p perc drain on 8/28, [place] -cont to follow drain for now -cont routine drain care and flushes -repeat CT scan in about a week or when drain output less than 10-15cc/day.  Electronically Signed: Letha Cape 01/22/2016, 9:26 AM   I spent a total of 15 Minutes at the the patient's bedside AND on the patient's hospital floor or unit, greater than 50% of which was counseling/coordinating care for RTP abscess

## 2016-01-22 NOTE — Progress Notes (Signed)
PROGRESS NOTE                                                                                                                                                                                                             Patient Demographics:    Shannon Montgomery, is a 81 y.o. female, DOB - 11-15-1934, ZOX:[id number]  Admit date - 01/20/2016   Admitting Physician Esperanza Sheets, MD  Outpatient Primary MD for the patient is Pearson Grippe, MD  LOS - 2  Chief Complaint  Patient presents with  . Fall       Brief Narrative  81 y/o female with PMH of Fibromyalgia, Questionable PMR, Hyponatremia, recently found to have liver lesions and right lung opacity on ct scan (8/3) took ciprofloxacin for 2 days as outpatient for possible pneumonia but did not follow up with ct scan of the ad/pelvis presented with weakness, tiredness and found to have hyponatremia with sodium 123. CT scan abdomen and pelvis this admission shows large retroperitoneal abscess, liver abscess and possible nonspecific changes in the right adrenal gland suspicious for malignancy.   Subjective:    Earnie Rockhold today has, No headache, No chest pain, No abdominal pain - No Nausea, No new weakness tingling or numbness, No Cough - SOB. I'll generalized weakness.   Assessment  & Plan :    1.Large retroperitoneal abscess, right lobe liver abscess. Nonspecific left adrenal gland changes. Blood cultures have been drawn, she will be continued on IV Zosyn, IR consulted and she underwent retroperitoneal drain placement on 01/21/2016, follow culture results, surgery has been consulted as well and they have following, outpatient ID follow-up discussed her case with Dr. Orvan Falconer ID on 01/21/2016 In 3-4 weeks. At any rate she would require 4-6 weeks of antibiotics based on culture results with outpatient IR, general surgery and ID follow-up. Also review cytology on the drain fluid.  2.  Hyponatremia. Her baseline seems to be close to 130, urine and serum osmolality along with urine sodium suggest most likely SIDH, we'll stop IV fluids restrict total fluid intake and give gentle Lasix 1 on 01/22/2016, repeat BMP in the morning.  3. Osteoarthritis, fibromyalgia, questionable underlying PMR - supportive care.  4. GERD. On PPI.  5. Hypertension. Stable off medications.  6. Nonspecific left adrenal gland changes noted on CT scan. Outpatient urology follow-up, follow  cytology results from the drained fluid.   Family Communication  : None present  Code Status :  Full  Diet : Healthy after IR places the drain  Disposition Plan  :  Stay inpatient  Consults  :  IR, general surgery, ID Dr. Orvan Falconer over the phone  Procedures  :    CT scan abdomen and pelvis with large retroperitoneal abscess, possible liver abscess, nonspecific adrenal gland changes.  Abdominal drain placement by IR on 01/21/2016  DVT Prophylaxis  :   Heparin    Lab Results  Component Value Date   PLT 477 (H) 01/22/2016    Inpatient Medications  Scheduled Meds: . aspirin EC  81 mg Oral Daily  . cholecalciferol  2,000 Units Oral Daily  . fluorometholone  1 drop Both Eyes BID  . heparin subcutaneous  5,000 Units Subcutaneous Q8H  . pantoprazole  40 mg Oral Daily  . piperacillin-tazobactam (ZOSYN)  IV  3.375 g Intravenous Q8H  . saccharomyces boulardii  250 mg Oral Daily   Continuous Infusions:   PRN Meds:.acetaminophen, bisacodyl, celecoxib, magnesium citrate, metoCLOPramide, ondansetron **OR** ondansetron (ZOFRAN) IV, senna-docusate, traMADol, traZODone  Antibiotics  :    Anti-infectives    Start     Dose/Rate Route Frequency Ordered Stop   01/20/16 0900  piperacillin-tazobactam (ZOSYN) IVPB 3.375 g     3.375 g 12.5 mL/hr over 240 Minutes Intravenous Every 8 hours 01/20/16 0855     01/20/16 0900  vancomycin (VANCOCIN) IVPB 750 mg/150 ml premix  Status:  Discontinued     750 mg 150  mL/hr over 60 Minutes Intravenous Every 12 hours 01/20/16 0855 01/21/16 0955         Objective:   Vitals:   01/21/16 1751 01/21/16 2130 01/22/16 0457 01/22/16 0801  BP: (!) 147/69 (!) 122/99 139/70 128/79  Pulse: 85 (!) 103 86 88  Resp: 18 16 16 17   Temp: 98 F (36.7 C) 97.7 F (36.5 C) 97.9 F (36.6 C) 97.9 F (36.6 C)  TempSrc: Oral Oral Oral Oral  SpO2: 98% 96% 99% 97%  Weight:  82.1 kg (181 lb 1.6 oz)    Height:        Wt Readings from Last 3 Encounters:  01/21/16 82.1 kg (181 lb 1.6 oz)  12/19/15 82.3 kg (181 lb 6.4 oz)  06/08/13 90.7 kg (200 lb)     Intake/Output Summary (Last 24 hours) at 01/22/16 1041 Last data filed at 01/22/16 0930  Gross per 24 hour  Intake          2328.75 ml  Output             1038 ml  Net          1290.75 ml     Physical Exam  Awake Alert, Oriented X 3, No new F.N deficits, Normal affect [place].AT,PERRAL Supple Neck,No JVD, No cervical lymphadenopathy appriciated.  Symmetrical Chest wall movement, Good air movement bilaterally, CTAB RRR,No Gallops,Rubs or new Murmurs, No Parasternal Heave +ve B.Sounds, Abd Soft, No tenderness, No organomegaly appriciated, No rebound - guarding or rigidity. No Cyanosis, Clubbing or edema, No new Rash or bruise       Data Review:    CBC  Recent Labs Lab 01/20/16 0613 01/21/16 0126 01/22/16 0951  WBC 27.0* 26.2* 21.0*  HGB 9.5* 9.4* 9.6*  HCT 28.7* 27.8* 29.3*  PLT 533* 486* 477*  MCV 79.7 78.8 80.3  MCH 26.4 26.6 26.3  MCHC 33.1 33.8 32.8  RDW 14.1 14.5 14.2  LYMPHSABS 0.5*  --   --   MONOABS 1.1*  --   --   EOSABS 0.0  --   --   BASOSABS 0.0  --   --     Chemistries   Recent Labs Lab 01/20/16 0439 01/20/16 0917 01/20/16 1627 01/21/16 0126 01/22/16 0951  NA 123* 122* 120* 123* 124*  K 3.5 3.5 3.3* 3.1* 3.5  CL 84* 86* 85* 84* 89*  CO2 26 24 22 28 27   GLUCOSE 132* 140* 144* 137* 126*  BUN 7 6 6  <5* 8  CREATININE 0.97 0.87 0.76 0.79 0.88  CALCIUM 8.1* 7.9* 7.7* 7.8* 7.6*   MG  --   --   --   --  1.2*  AST 27  --   --  23  --   ALT 19  --   --  18  --   ALKPHOS 185*  --   --  152*  --   BILITOT 0.9  --   --  0.8  --    ------------------------------------------------------------------------------------------------------------------ No results for input(s): CHOL, HDL, LDLCALC, TRIG, CHOLHDL, LDLDIRECT in the last 72 hours.  No results found for: HGBA1C ------------------------------------------------------------------------------------------------------------------ No results for input(s): TSH, T4TOTAL, T3FREE, THYROIDAB in the last 72 hours.  Invalid input(s): FREET3 ------------------------------------------------------------------------------------------------------------------ No results for input(s): VITAMINB12, FOLATE, FERRITIN, TIBC, IRON, RETICCTPCT in the last 72 hours.  Coagulation profile  Recent Labs Lab 01/20/16 1627  INR 1.28    No results for input(s): DDIMER in the last 72 hours.  Cardiac Enzymes No results for input(s): CKMB, TROPONINI, MYOGLOBIN in the last 168 hours.  Invalid input(s): CK ------------------------------------------------------------------------------------------------------------------ No results found for: BNP  Micro Results Recent Results (from the past 240 hour(s))  Urine culture     Status: None   Collection Time: 01/20/16  5:00 AM  Result Value Ref Range Status   Specimen Description URINE, CATHETERIZED  Final   Special Requests NONE  Final   Culture NO GROWTH  Final   Report Status 01/21/2016 FINAL  Final  Culture, blood (Routine X 2) w Reflex to ID Panel     Status: None (Preliminary result)   Collection Time: 01/20/16  2:15 PM  Result Value Ref Range Status   Specimen Description BLOOD LEFT ANTECUBITAL  Final   Special Requests BOTTLES DRAWN AEROBIC AND ANAEROBIC 10CC  Final   Culture NO GROWTH 2 DAYS  Final   Report Status PENDING  Incomplete  Culture, blood (Routine X 2) w Reflex to ID Panel      Status: None (Preliminary result)   Collection Time: 01/20/16  2:20 PM  Result Value Ref Range Status   Specimen Description BLOOD BLOOD LEFT HAND  Final   Special Requests BOTTLES DRAWN AEROBIC AND ANAEROBIC 5CC  Final   Culture NO GROWTH 2 DAYS  Final   Report Status PENDING  Incomplete  Aerobic/Anaerobic Culture (surgical/deep wound)     Status: None (Preliminary result)   Collection Time: 01/21/16  5:15 PM  Result Value Ref Range Status   Specimen Description ABSCESS  Final   Special Requests RIGHT UPPER QUADRANT  Final   Gram Stain   Final    ABUNDANT WBC PRESENT, PREDOMINANTLY PMN ABUNDANT GRAM POSITIVE COCCI IN PAIRS IN CHAINS FEW GRAM NEGATIVE RODS    Culture PENDING  Incomplete   Report Status PENDING  Incomplete    Radiology Reports X-ray Chest Pa And Lateral  Result Date: 01/20/2016 CLINICAL DATA:  Pleural effusion. EXAM: CHEST  2 VIEW COMPARISON:  01/20/2016, earlier the same day.  12/19/2015. FINDINGS: Lower lung volumes with increased atelectasis at the right lung base. Small right pleural effusion again noted. New atelectasis seen at the left lung base on this lower volume film. Low lung volumes accentuate the cardiopericardial silhouette. Bones are diffusely demineralized. IMPRESSION: Low volume film with increased bibasilar atelectasis and small right pleural effusion. Electronically Signed   By: Kennith Center M.D.   On: 01/20/2016 16:32   Dg Chest 2 View  Result Date: 01/20/2016 CLINICAL DATA:  Chest pain tonight. Multiple falls over the past week. EXAM: CHEST  2 VIEW COMPARISON:  Radiographs 12/19/2015.  CT 12/27/2015 FINDINGS: Again seen elevation of the right hemidiaphragm. Increased linear opacity in the adjacent right lung base, may be increased atelectasis or fluid in the fissure, suspect mild increase in right pleural effusion. Tiny left pleural effusion with blunting of the costophrenic angle. Unchanged heart size and aortic contour is allowing for  differences in technique. Thoracic aortic atherosclerosis. Increased left basilar atelectasis. No pulmonary edema or pneumothorax. Compression deformity in the lower thoracic spine, as seen on CT. IMPRESSION: 1. Increased linear opacity adjacent the right lung base, may be increased atelectasis or fluid in the fissure. 2. Suspect increased in small right pleural effusion, development small left pleural effusion. Electronically Signed   By: Rubye Oaks M.D.   On: 01/20/2016 05:59   Ct Chest W Contrast  Result Date: 12/27/2015 CLINICAL DATA:  Small right pleural effusion questioned on chest x-ray, preop for total knee replacement EXAM: CT CHEST WITH CONTRAST TECHNIQUE: Multidetector CT imaging of the chest was performed during intravenous contrast administration. CONTRAST:  75mL ISOVUE-300 IOPAMIDOL (ISOVUE-300) INJECTION 61% COMPARISON:  Chest x-ray of 12/19/2015 FINDINGS: Cardiovascular: There is good opacification of the thoracic aorta. The mid ascending thoracic aorta measures 36 mm in diameter. The pulmonary arteries are opacified as well with no central abnormality evident. Coronary artery calcifications are present in the distribution of the left anterior descending coronary artery. No pericardial effusion is seen. Moderate thoracic aortic atherosclerosis is present. Mediastinum/Nodes: No mediastinal or hilar adenopathy is noted. The thyroid gland is unremarkable. Lungs/Pleura: On lung window images, only a tiny right pleural effusion is present. The majority the opacity at the posterior right lung base appears to be due to atelectasis involving both the right lower lobe and the medial right middle lobe possibly as result of chronic elevation of the right hemidiaphragm. An inflammatory process cannot be excluded, but I would favor this being a chronic process. The left lung is clear other than mild linear scarring in the posterior left lower lobe. The central bronchi are patent. No suspicious lung  nodule or mass is seen. Upper Abdomen: However, there is a definite abnormality within the posterior aspect of the right lobe of liver. On axial image 116 series 4 there is a rounded lesion in the posterior right lobe of liver of 5.0 x 4.8 cm with probable adjacent fluid. More caudally there is a lower attenuation portion of this lesion measuring 3.4 x 3.5 cm with attenuation of 15 HU. Immediately adjacent extending along the posterior capsule of the right posterior lobe of liver there is a collection of fluid or soft tissue measuring 17 HU extending into the right retroperitoneum. This collection also appears to invade the adjacent fat planes and possibly the right lower posterolateral chest wall. Although these changes could be inflammatory in nature, a neoplastic process is a definite consideration. CT of the abdomen pelvis with oral and IV contrast  media is recommended. Incidental gallstones also are noted within the gallbladder, the largest of 2.9 cm. No gallbladder wall thickening is seen. A few minimally prominent lymph nodes are present near the porta hepatis. Musculoskeletal: On the sagittal view there appears to be compression deformity of T11 and L2 of uncertain acuity. Also there is irregularity of the sternum most likely due to prior fracture with healing. Clinical correlation is recommended. IMPRESSION: 1. Abnormality within the posterior right lobe of liver with adjacent fluid and/or soft tissue extending more caudally. Some of this soft tissue does appear to invade the fat planes of the adjacent right posterolateral chest wall. Although these changes could be inflammatory, a neoplastic process is a definite consideration. Recommend CT of the abdomen pelvis with oral and IV contrast media. 2. Incidental gallstones. 3. Opacity at the right lung base is most likely due to chronic atelectasis with elevation of the right hemidiaphragm also involving the medial right middle lobe. Only a tiny right pleural  effusion is seen. This opacity may also be in part due to the process involving the posterior right lobe of liver as described above. 4. Partial compression deformity of T11 and possibly L2 of uncertain acuity. 5. Probable old fracture of the sternum.  Correlate clinically. 6. Moderate thoracic aortic atherosclerosis. Electronically Signed   By: Dwyane Dee M.D.   On: 12/27/2015 16:24   [place] Chest  Result Date: 01/20/2016 CLINICAL DATA:  Suspected right pleural effusion. EXAM: CHEST ULTRASOUND COMPARISON:  Chest radiograph 01/20/2016 FINDINGS: Focused right chest wall ultrasound demonstrates small right subpulmonic effusion with maximum thickness of 12 mm, not amenable to thoracocentesis. IMPRESSION: Small right pleural effusion. Electronically Signed   By: Ted Mcalpine M.D.   On: 01/20/2016 11:36   Ct Abdomen Pelvis W Contrast  Result Date: 01/20/2016 CLINICAL DATA:  Liver abscess. EXAM: CT ABDOMEN AND PELVIS WITH CONTRAST TECHNIQUE: Multidetector CT imaging of the abdomen and pelvis was performed using the standard protocol following bolus administration of intravenous contrast. CONTRAST:  100 mL Isovue 300 COMPARISON:  Chest CT 12/27/2015 FINDINGS: Lower chest: Band of atelectasis in the LEFT and RIGHT lower lobe. Small bilateral pleural effusions. Hepatobiliary: The inferior aspect of the RIGHT hepatic lobe is a 3.1 by 2.4 cm hypodense lesion with ill-defined margins which is decreased from 3.4 by 3.4 cm on prior. However, there is a complex fluid collection adjacent to RIGHT hepatic lobe which is increased in size compared to prior measuring 10.2 by 3.2 cm (image 24, series 201) increased from 5.7 by 2.2 cm. This perihepatic fluid collection has internal septations and gas locules. Fluid collections extend along the RIGHT retroperitoneal space into the iliac fossa. Collection measures 5.9 by 4.8 cm (image 46, series 201) at the level of the iliac crest. Fluid collection extends anterior to the  iliacus muscle the level of the RIGHT external iliac artery measuring 4.4 by 3.2 cm this level (image 64, series 201). There is thickening along the lateral aspect of the the para renal fascia (image 30, series 201. The remaining of the liver parenchyma is normal. There multiple large gallstones within the lumen gallbladder ranging in size from 12 to 20 mm. The gallbladder distension. Common bile duct normal. Pancreas: Normal Spleen: Normal spleen Adrenals/urinary tract: There is ill-defined tissue associated the inferior aspect of the LEFT adrenal gland (image 26, series 201. RIGHT adrenal glands normal. The kidneys enhance symmetrically. Ureters appear normal. Bladder normal Stomach/Bowel: The stomach, small bowel normal. There is some thickening along the terminal  ileum. There is thickening along the ascending colon just above the cecum. This is adjacent to the retroperitoneal fluid collection / infection may be secondary inflammation. Vascular/Lymphatic:  Clear calcification Reproductive: Uterus and ovaries normal. Other: No free fluid. Musculoskeletal: No aggressive osseous lesion. IMPRESSION: 1. Retroperitoneal fluid collection extends from the RIGHT iliac fossa to the RIGHT hepatic lobe margin consistent with large RETROPERITONEAL ABSCESS. There septation and gas collection in the most superior aspect adjacent to the liver with mass-effect on liver. Recommend percutaneous drainage and cytology of the fluid collection RIGHT lateral to the liver. 2. Presumed hepatic abscess in the most inferior RIGHT hepatic lobe is slightly smaller. 3. Ill-defined tissue associated with the LEFT adrenal gland is indeterminate. Cannot exclude malignancy. Recommend correlation with cytology as above. 4. Thicken along the perirenal fascia laterally adjacent to the RIGHT kidney. Differential includes infection or malignancy. 5. Thickening of the terminal ileum and ascending colon may be secondary to the retroperitoneal infection.  6. Cholelithiasis. These results will be called to the ordering clinician or representative by the Radiologist Assistant, and communication documented in the PACS or zVision Dashboard. Electronically Signed   By: Genevive BiStewart  Edmunds M.D.   On: 01/20/2016 16:29    Time Spent in minutes  30   SINGH,PRASHANT K M.D on 01/22/2016 at 10:41 AM  Between 7am to 7pm - Pager - [phone]  After 7pm go to [email or web address] - password [email or web address]  [facility] -  Office  [phone]

## 2016-01-23 DIAGNOSIS — I10 Essential (primary) hypertension: Secondary | ICD-10-CM

## 2016-01-23 DIAGNOSIS — K7689 Other specified diseases of liver: Secondary | ICD-10-CM

## 2016-01-23 DIAGNOSIS — M797 Fibromyalgia: Secondary | ICD-10-CM

## 2016-01-23 DIAGNOSIS — E871 Hypo-osmolality and hyponatremia: Secondary | ICD-10-CM

## 2016-01-23 DIAGNOSIS — E559 Vitamin D deficiency, unspecified: Secondary | ICD-10-CM

## 2016-01-23 DIAGNOSIS — M353 Polymyalgia rheumatica: Secondary | ICD-10-CM

## 2016-01-23 DIAGNOSIS — K209 Esophagitis, unspecified: Secondary | ICD-10-CM

## 2016-01-23 DIAGNOSIS — D72829 Elevated white blood cell count, unspecified: Secondary | ICD-10-CM

## 2016-01-23 LAB — BASIC METABOLIC PANEL
ANION GAP: 9 (ref 5–15)
BUN: 7 mg/dL (ref 6–20)
CALCIUM: 7.9 mg/dL — AB (ref 8.9–10.3)
CO2: 29 mmol/L (ref 22–32)
Chloride: 90 mmol/L — ABNORMAL LOW (ref 101–111)
Creatinine, Ser: 0.85 mg/dL (ref 0.44–1.00)
GFR calc Af Amer: >60 mL/min (ref >60)
GLUCOSE: 95 mg/dL (ref 65–99)
Potassium: 2.9 mmol/L — ABNORMAL LOW (ref 3.5–5.1)
Sodium: 128 mmol/L — ABNORMAL LOW (ref 135–145)

## 2016-01-23 LAB — MAGNESIUM: Magnesium: 1.5 mg/dL — ABNORMAL LOW (ref 1.7–2.4)

## 2016-01-23 MED ORDER — POTASSIUM CHLORIDE CRYS ER 20 MEQ PO TBCR
40.0000 meq | EXTENDED_RELEASE_TABLET | Freq: Two times a day (BID) | ORAL | Status: DC
  Administered 2016-01-23 – 2016-01-25 (×5): 40 meq via ORAL
  Filled 2016-01-23 (×5): qty 2

## 2016-01-23 MED ORDER — IOPAMIDOL (ISOVUE-300) INJECTION 61%
100.0000 mL | Freq: Once | INTRAVENOUS | Status: AC | PRN
  Administered 2016-01-20: 100 mL via INTRAVENOUS

## 2016-01-23 NOTE — Evaluation (Signed)
Physical Therapy Evaluation Patient Details Name: Shannon LoreCarol A Lorimer MRN: [id number] DOB: [birth year] Today's Date: 01/23/2016   History of Present Illness  Pt admitted with hyponatremia after falling at home. Pt with poor appetite and dehydration. PMH: recent dx of liver lesions and R lung opacity as she was preparing for a R TKA, fibromyalgia, polymyalgia rheumatica.  Clinical Impression   Pt admitted with above diagnosis. Pt currently with functional limitations due to the deficits listed below (see PT Problem List).  Pt will benefit from skilled PT to increase their independence and safety with mobility to allow discharge to the venue listed below.       Follow Up Recommendations Home health PT;Supervision/Assistance - 24 hour    Equipment Recommendations  Rolling walker with 5" wheels;3in1 (PT)    Recommendations for Other Services       Precautions / Restrictions Precautions Precautions: Fall      Mobility  Bed Mobility Overal bed mobility: Modified Independent             General bed mobility comments: increased time, no physical assist or use of rail, bed nearly flat  Transfers Overall transfer level: Needs assistance Equipment used: 1 person hand held assist Transfers: Sit to/from Stand Sit to Stand: Min guard         General transfer comment: Overall good rise; minguard for steadiness  Ambulation/Gait Ambulation/Gait assistance: Min guard;Min assist Ambulation Distance (Feet): 100 Feet Assistive device: 1 person hand held assist (and hallway rail) Gait Pattern/deviations: Step-through pattern;Decreased step length - right;Decreased step length - left;Decreased stride length     General Gait Details: Cues to self-monitor for activity tolerance; noting unsteadiness, with pt naturally reaching out for UE support from hallway rail; she declined RW when I mentioned it to her; towards end of walk she was more agreeable to [employer]  Stairs             Wheelchair Mobility    Modified Rankin (Stroke Patients Only)       Balance Overall balance assessment: Needs assistance           Standing balance-Leahy Scale: Fair                               Pertinent Vitals/Pain Pain Assessment: Faces Faces Pain Scale: Hurts a little bit Pain Location: Drain site Pain Descriptors / Indicators: Aching;Discomfort Pain Intervention(s): Monitored during session    Home Living Family/patient expects to be discharged to:: Private residence Living Arrangements: Spouse/significant other Available Help at Discharge: Family;Available 24 hours/day Type of Home: House Home Access: Stairs to enter Entrance Stairs-Rails: Right Entrance Stairs-Number of Steps: 6 Home Layout: Two level;Able to live on main level with bedroom/bathroom Home Equipment: Bedside commode;Hand held shower head Additional Comments: pt uses BSC at night    Prior Function Level of Independence: Independent         Comments: doesn't drive due to vision     Hand Dominance   Dominant Hand: Right    Extremity/Trunk Assessment   Upper Extremity Assessment: Defer to OT evaluation           Lower Extremity Assessment: Generalized weakness         Communication   Communication: No difficulties  Cognition Arousal/Alertness: Awake/alert Behavior During Therapy: WFL for tasks assessed/performed Overall Cognitive Status: Within Functional Limits for tasks assessed  General Comments      Exercises        Assessment/Plan    PT Assessment Patient needs continued PT services  PT Diagnosis Difficulty walking   PT Problem List Decreased strength;Decreased activity tolerance;Decreased balance;Decreased mobility;Decreased knowledge of use of DME;Decreased knowledge of precautions;Pain  PT Treatment Interventions DME instruction;Gait training;Stair training;Functional mobility training;Therapeutic  activities;Therapeutic exercise;Patient/family education;Balance training   PT Goals (Current goals can be found in the Care Plan section) Acute Rehab PT Goals Patient Stated Goal: to return home, get stronger PT Goal Formulation: With patient Time For Goal Achievement: 02/06/16 Potential to Achieve Goals: Good    Frequency Min 3X/week   Barriers to discharge        Co-evaluation               End of Session   Activity Tolerance: Patient tolerated treatment well Patient left: in bed;with call bell/phone within reach;with family/visitor present Nurse Communication: Mobility status         Time: 1414-1440 PT Time Calculation (min) (ACUTE ONLY): 26 min   Charges:   PT Evaluation $PT Eval Moderate Complexity: 1 Procedure PT Treatments $Gait Training: 8-22 mins   PT G CodesOlen Pel 01/23/2016, 3:17 PM  Van Clines, [place]  Acute Rehabilitation Services Pager [phone] Office [phone]

## 2016-01-23 NOTE — Progress Notes (Addendum)
PROGRESS NOTE                                                                                                                                                                                                             Patient Demographics:    Shannon Montgomery, is a 81 y.o. female, DOB - [birth year], WUJ:[id number]  Admit date - 01/20/2016   Admitting Physician Esperanza Sheets, MD  Outpatient Primary MD for the patient is Pearson Grippe, MD  LOS - 3  Chief Complaint  Patient presents with  . Fall       Brief Narrative  81 y/o female with PMH of Fibromyalgia,  PMR who was on steroids in the past for 2 years, Hyponatremia, recently found to have liver lesions and right lung opacity on ct scan (8/3) took ciprofloxacin for 2 days as outpatient for possible pneumonia but did not follow up with ct scan of the ad/pelvis presented with weakness, tiredness and found to have hyponatremia with sodium 123.  CT scan abdomen and pelvis this admission shows large retroperitoneal abscess, liver abscess and possible nonspecific changes in the right adrenal gland suspicious for malignancy.   Subjective:   Fair Cannot eat as no appetite Asking for reg diet No cp No sob    Assessment  & Plan :    1.Large retroperitoneal abscess, right lobe liver abscess.  Nonspecific left adrenal gland changes.  Blood cultures ngtd Drain cult growing Bacteroides + Strp Constellatas cont IV Zosyn,  IR consulted and she underwent retroperitoneal drain placement on 01/21/2016, follow culture results,  surgery has been consulted as well and they have following,  outpatient ID follow-up discussed her case with Dr. Orvan Falconer ID on 01/21/2016  In 3-4 weeks.  At any rate she would require 4-6 weeks of antibiotics based on culture results with outpatient IR, general surgery and ID follow-up.   cytology on the drain fluid is still pending  2. Hyponatremia. Her  baseline seems to be close to 130, urine and serum osmolality along with urine sodium suggest most likely SIaDH,  Stopped  IV fluids restrict total fluid intake and give gentle Lasix 1 on 01/22/2016,  repeat BMP shows some resolution Rpt labs again am 9/1  3. Osteoarthritis, fibromyalgia, questionable underlying PMR - supportive care.  4. GERD. On PPI.  5. Hypertension. Stable off medications.  6. Nonspecific left adrenal gland changes noted on CT scan. Outpatient urology follow-up, follow cytology results from the drained fluid  7. Hypokalemia Replace with oral kdur 40 bid Rpt labs am.   Family Communication  : None present  Code Status :  Full  Diet : Healthy after IR places the drain  Disposition Plan  :  Stay inpatient  Consults  :  IR, general surgery, ID Dr. Orvan Falconer over the phone  Procedures  :    CT scan abdomen and pelvis with large retroperitoneal abscess, possible liver abscess, nonspecific adrenal gland changes.  Abdominal drain placement by IR on 01/21/2016  DVT Prophylaxis  :   Heparin    Lab Results  Component Value Date   PLT 477 (H) 01/22/2016    Inpatient Medications  Scheduled Meds: . aspirin EC  81 mg Oral Daily  . cholecalciferol  2,000 Units Oral Daily  . fluorometholone  1 drop Both Eyes BID  . heparin subcutaneous  5,000 Units Subcutaneous Q8H  . pantoprazole  40 mg Oral Daily  . piperacillin-tazobactam (ZOSYN)  IV  3.375 g Intravenous Q8H  . potassium chloride  40 mEq Oral BID  . saccharomyces boulardii  250 mg Oral Daily   Continuous Infusions:   PRN Meds:.acetaminophen, bisacodyl, celecoxib, magnesium citrate, metoCLOPramide, ondansetron **OR** ondansetron (ZOFRAN) IV, senna-docusate, traMADol, traZODone  Antibiotics  :    Anti-infectives    Start     Dose/Rate Route Frequency Ordered Stop   01/20/16 0900  piperacillin-tazobactam (ZOSYN) IVPB 3.375 g     3.375 g 12.5 mL/hr over 240 Minutes Intravenous Every 8 hours 01/20/16  0855     01/20/16 0900  vancomycin (VANCOCIN) IVPB 750 mg/150 ml premix  Status:  Discontinued     750 mg 150 mL/hr over 60 Minutes Intravenous Every 12 hours 01/20/16 0855 01/21/16 0955         Objective:   Vitals:   01/22/16 1748 01/22/16 2004 01/23/16 0541 01/23/16 0915  BP: 132/71 135/70 139/75 138/77  Pulse: 87 83 90 88  Resp: 17 18 18 17   Temp: 98 F (36.7 C) 99.2 F (37.3 C) 97.7 F (36.5 C) 97.8 F (36.6 C)  TempSrc: Oral   Oral  SpO2: 99% 97% 98% 98%  Weight:  83.5 kg (184 lb)    Height:        Wt Readings from Last 3 Encounters:  01/22/16 83.5 kg (184 lb)  12/19/15 82.3 kg (181 lb 6.4 oz)  06/08/13 90.7 kg (200 lb)     Intake/Output Summary (Last 24 hours) at 01/23/16 1634 Last data filed at 01/23/16 1500  Gross per 24 hour  Intake              890 ml  Output             1905 ml  Net            -1015 ml     Physical Exam  Awake Alert, Oriented X 3, No new F.N deficits, Normal affect [place].AT,PERRAL Supple Neck,No JVD, No cervical lymphadenopathy appriciated.  Symmetrical Chest wall movement, Good air movement bilaterally, CTAB RRR,No Gallops,no m/r/g Le's no swelling     Data Review:    CBC  Recent Labs Lab 01/20/16 0613 01/21/16 0126 01/22/16 0951  WBC 27.0* 26.2* 21.0*  HGB 9.5* 9.4* 9.6*  HCT 28.7* 27.8* 29.3*  PLT 533* 486* 477*  MCV 79.7 78.8 80.3  MCH 26.4 26.6 26.3  MCHC 33.1 33.8 32.8  RDW 14.1 14.5 14.2  LYMPHSABS 0.5*  --   --   MONOABS 1.1*  --   --   EOSABS 0.0  --   --   BASOSABS 0.0  --   --     Chemistries   Recent Labs Lab 01/20/16 0439 01/20/16 0917 01/20/16 1627 01/21/16 0126 01/22/16 0951 01/23/16 0412  NA 123* 122* 120* 123* 124* 128*  K 3.5 3.5 3.3* 3.1* 3.5 2.9*  CL 84* 86* 85* 84* 89* 90*  CO2 26 24 22 28 27 29   GLUCOSE 132* 140* 144* 137* 126* 95  BUN 7 6 6  <5* 8 7  CREATININE 0.97 0.87 0.76 0.79 0.88 0.85  CALCIUM 8.1* 7.9* 7.7* 7.8* 7.6* 7.9*  MG  --   --   --   --  1.2* 1.5*  AST 27  --    --  23  --   --   ALT 19  --   --  18  --   --   ALKPHOS 185*  --   --  152*  --   --   BILITOT 0.9  --   --  0.8  --   --    ------------------------------------------------------------------------------------------------------------------ No results for input(s): CHOL, HDL, LDLCALC, TRIG, CHOLHDL, LDLDIRECT in the last 72 hours.  No results found for: HGBA1C ------------------------------------------------------------------------------------------------------------------ No results for input(s): TSH, T4TOTAL, T3FREE, THYROIDAB in the last 72 hours.  Invalid input(s): FREET3 ------------------------------------------------------------------------------------------------------------------ No results for input(s): VITAMINB12, FOLATE, FERRITIN, TIBC, IRON, RETICCTPCT in the last 72 hours.  Coagulation profile  Recent Labs Lab 01/20/16 1627  INR 1.28    No results for input(s): DDIMER in the last 72 hours.  Cardiac Enzymes No results for input(s): CKMB, TROPONINI, MYOGLOBIN in the last 168 hours.  Invalid input(s): CK ------------------------------------------------------------------------------------------------------------------ No results found for: BNP  Micro Results Recent Results (from the past 240 hour(s))  Urine culture     Status: None   Collection Time: 01/20/16  5:00 AM  Result Value Ref Range Status   Specimen Description URINE, CATHETERIZED  Final   Special Requests NONE  Final   Culture NO GROWTH  Final   Report Status 01/21/2016 FINAL  Final  Culture, blood (Routine X 2) w Reflex to ID Panel     Status: None (Preliminary result)   Collection Time: 01/20/16  2:15 PM  Result Value Ref Range Status   Specimen Description BLOOD LEFT ANTECUBITAL  Final   Special Requests BOTTLES DRAWN AEROBIC AND ANAEROBIC 10CC  Final   Culture NO GROWTH 3 DAYS  Final   Report Status PENDING  Incomplete  Culture, blood (Routine X 2) w Reflex to ID Panel     Status: None  (Preliminary result)   Collection Time: 01/20/16  2:20 PM  Result Value Ref Range Status   Specimen Description BLOOD BLOOD LEFT HAND  Final   Special Requests BOTTLES DRAWN AEROBIC AND ANAEROBIC 5CC  Final   Culture NO GROWTH 3 DAYS  Final   Report Status PENDING  Incomplete  Aerobic/Anaerobic Culture (surgical/deep wound)     Status: None (Preliminary result)   Collection Time: 01/21/16  5:15 PM  Result Value Ref Range Status   Specimen Description ABSCESS  Final   Special Requests RIGHT UPPER QUADRANT  Final   Gram Stain   Final    ABUNDANT WBC PRESENT, PREDOMINANTLY PMN ABUNDANT GRAM POSITIVE COCCI IN PAIRS IN CHAINS FEW GRAM NEGATIVE RODS    Culture   Final    ABUNDANT  STREPTOCOCCUS CONSTELLATUS MODERATE BACTEROIDES FRAGILIS BETA LACTAMASE POSITIVE    Report Status PENDING  Incomplete    Radiology Reports X-ray Chest Pa And Lateral  Result Date: 01/20/2016 CLINICAL DATA:  Pleural effusion. EXAM: CHEST  2 VIEW COMPARISON:  01/20/2016, earlier the same day.  12/19/2015. FINDINGS: Lower lung volumes with increased atelectasis at the right lung base. Small right pleural effusion again noted. New atelectasis seen at the left lung base on this lower volume film. Low lung volumes accentuate the cardiopericardial silhouette. Bones are diffusely demineralized. IMPRESSION: Low volume film with increased bibasilar atelectasis and small right pleural effusion. Electronically Signed   By: Kennith CenterEric  Mansell M.D.   On: 01/20/2016 16:32   Dg Chest 2 View  Result Date: 01/20/2016 CLINICAL DATA:  Chest pain tonight. Multiple falls over the past week. EXAM: CHEST  2 VIEW COMPARISON:  Radiographs 12/19/2015.  CT 12/27/2015 FINDINGS: Again seen elevation of the right hemidiaphragm. Increased linear opacity in the adjacent right lung base, may be increased atelectasis or fluid in the fissure, suspect mild increase in right pleural effusion. Tiny left pleural effusion with blunting of the costophrenic  angle. Unchanged heart size and aortic contour is allowing for differences in technique. Thoracic aortic atherosclerosis. Increased left basilar atelectasis. No pulmonary edema or pneumothorax. Compression deformity in the lower thoracic spine, as seen on CT. IMPRESSION: 1. Increased linear opacity adjacent the right lung base, may be increased atelectasis or fluid in the fissure. 2. Suspect increased in small right pleural effusion, development small left pleural effusion. Electronically Signed   By: Rubye OaksMelanie  Ehinger M.D.   On: 01/20/2016 05:59   Ct Chest W Contrast  Result Date: 12/27/2015 CLINICAL DATA:  Small right pleural effusion questioned on chest x-ray, preop for total knee replacement EXAM: CT CHEST WITH CONTRAST TECHNIQUE: Multidetector CT imaging of the chest was performed during intravenous contrast administration. CONTRAST:  75mL ISOVUE-300 IOPAMIDOL (ISOVUE-300) INJECTION 61% COMPARISON:  Chest x-ray of 12/19/2015 FINDINGS: Cardiovascular: There is good opacification of the thoracic aorta. The mid ascending thoracic aorta measures 36 mm in diameter. The pulmonary arteries are opacified as well with no central abnormality evident. Coronary artery calcifications are present in the distribution of the left anterior descending coronary artery. No pericardial effusion is seen. Moderate thoracic aortic atherosclerosis is present. Mediastinum/Nodes: No mediastinal or hilar adenopathy is noted. The thyroid gland is unremarkable. Lungs/Pleura: On lung window images, only a tiny right pleural effusion is present. The majority the opacity at the posterior right lung base appears to be due to atelectasis involving both the right lower lobe and the medial right middle lobe possibly as result of chronic elevation of the right hemidiaphragm. An inflammatory process cannot be excluded, but I would favor this being a chronic process. The left lung is clear other than mild linear scarring in the posterior left lower  lobe. The central bronchi are patent. No suspicious lung nodule or mass is seen. Upper Abdomen: However, there is a definite abnormality within the posterior aspect of the right lobe of liver. On axial image 116 series 4 there is a rounded lesion in the posterior right lobe of liver of 5.0 x 4.8 cm with probable adjacent fluid. More caudally there is a lower attenuation portion of this lesion measuring 3.4 x 3.5 cm with attenuation of 15 HU. Immediately adjacent extending along the posterior capsule of the right posterior lobe of liver there is a collection of fluid or soft tissue measuring 17 HU extending into the right retroperitoneum. This  collection also appears to invade the adjacent fat planes and possibly the right lower posterolateral chest wall. Although these changes could be inflammatory in nature, a neoplastic process is a definite consideration. CT of the abdomen pelvis with oral and IV contrast media is recommended. Incidental gallstones also are noted within the gallbladder, the largest of 2.9 cm. No gallbladder wall thickening is seen. A few minimally prominent lymph nodes are present near the porta hepatis. Musculoskeletal: On the sagittal view there appears to be compression deformity of T11 and L2 of uncertain acuity. Also there is irregularity of the sternum most likely due to prior fracture with healing. Clinical correlation is recommended. IMPRESSION: 1. Abnormality within the posterior right lobe of liver with adjacent fluid and/or soft tissue extending more caudally. Some of this soft tissue does appear to invade the fat planes of the adjacent right posterolateral chest wall. Although these changes could be inflammatory, a neoplastic process is a definite consideration. Recommend CT of the abdomen pelvis with oral and IV contrast media. 2. Incidental gallstones. 3. Opacity at the right lung base is most likely due to chronic atelectasis with elevation of the right hemidiaphragm also involving  the medial right middle lobe. Only a tiny right pleural effusion is seen. This opacity may also be in part due to the process involving the posterior right lobe of liver as described above. 4. Partial compression deformity of T11 and possibly L2 of uncertain acuity. 5. Probable old fracture of the sternum.  Correlate clinically. 6. Moderate thoracic aortic atherosclerosis. Electronically Signed   By: Dwyane Dee M.D.   On: 12/27/2015 16:24   [place] Chest  Result Date: 01/20/2016 CLINICAL DATA:  Suspected right pleural effusion. EXAM: CHEST ULTRASOUND COMPARISON:  Chest radiograph 01/20/2016 FINDINGS: Focused right chest wall ultrasound demonstrates small right subpulmonic effusion with maximum thickness of 12 mm, not amenable to thoracocentesis. IMPRESSION: Small right pleural effusion. Electronically Signed   By: Ted Mcalpine M.D.   On: 01/20/2016 11:36   Ct Abdomen Pelvis W Contrast  Result Date: 01/20/2016 CLINICAL DATA:  Liver abscess. EXAM: CT ABDOMEN AND PELVIS WITH CONTRAST TECHNIQUE: Multidetector CT imaging of the abdomen and pelvis was performed using the standard protocol following bolus administration of intravenous contrast. CONTRAST:  100 mL Isovue 300 COMPARISON:  Chest CT 12/27/2015 FINDINGS: Lower chest: Band of atelectasis in the LEFT and RIGHT lower lobe. Small bilateral pleural effusions. Hepatobiliary: The inferior aspect of the RIGHT hepatic lobe is a 3.1 by 2.4 cm hypodense lesion with ill-defined margins which is decreased from 3.4 by 3.4 cm on prior. However, there is a complex fluid collection adjacent to RIGHT hepatic lobe which is increased in size compared to prior measuring 10.2 by 3.2 cm (image 24, series 201) increased from 5.7 by 2.2 cm. This perihepatic fluid collection has internal septations and gas locules. Fluid collections extend along the RIGHT retroperitoneal space into the iliac fossa. Collection measures 5.9 by 4.8 cm (image 46, series 201) at the level of the  iliac crest. Fluid collection extends anterior to the iliacus muscle the level of the RIGHT external iliac artery measuring 4.4 by 3.2 cm this level (image 64, series 201). There is thickening along the lateral aspect of the the para renal fascia (image 30, series 201. The remaining of the liver parenchyma is normal. There multiple large gallstones within the lumen gallbladder ranging in size from 12 to 20 mm. The gallbladder distension. Common bile duct normal. Pancreas: Normal Spleen: Normal spleen Adrenals/urinary tract: There  is ill-defined tissue associated the inferior aspect of the LEFT adrenal gland (image 26, series 201. RIGHT adrenal glands normal. The kidneys enhance symmetrically. Ureters appear normal. Bladder normal Stomach/Bowel: The stomach, small bowel normal. There is some thickening along the terminal ileum. There is thickening along the ascending colon just above the cecum. This is adjacent to the retroperitoneal fluid collection / infection may be secondary inflammation. Vascular/Lymphatic:  Clear calcification Reproductive: Uterus and ovaries normal. Other: No free fluid. Musculoskeletal: No aggressive osseous lesion. IMPRESSION: 1. Retroperitoneal fluid collection extends from the RIGHT iliac fossa to the RIGHT hepatic lobe margin consistent with large RETROPERITONEAL ABSCESS. There septation and gas collection in the most superior aspect adjacent to the liver with mass-effect on liver. Recommend percutaneous drainage and cytology of the fluid collection RIGHT lateral to the liver. 2. Presumed hepatic abscess in the most inferior RIGHT hepatic lobe is slightly smaller. 3. Ill-defined tissue associated with the LEFT adrenal gland is indeterminate. Cannot exclude malignancy. Recommend correlation with cytology as above. 4. Thicken along the perirenal fascia laterally adjacent to the RIGHT kidney. Differential includes infection or malignancy. 5. Thickening of the terminal ileum and ascending  colon may be secondary to the retroperitoneal infection. 6. Cholelithiasis. These results will be called to the ordering clinician or representative by the Radiologist Assistant, and communication documented in the PACS or zVision Dashboard. Electronically Signed   By: Genevive Bi M.D.   On: 01/20/2016 16:29    Time Spent in minutes  25  Pleas Koch, MD [facility] [phone]

## 2016-01-24 LAB — CBC WITH DIFFERENTIAL/PLATELET
BASOS PCT: 0 %
Basophils Absolute: 0 10*3/uL (ref 0.0–0.1)
EOS ABS: 0.2 10*3/uL (ref 0.0–0.7)
Eosinophils Relative: 2 %
HEMATOCRIT: 29.3 % — AB (ref 36.0–46.0)
Hemoglobin: 9.5 g/dL — ABNORMAL LOW (ref 12.0–15.0)
Lymphocytes Relative: 7 %
Lymphs Abs: 0.7 10*3/uL (ref 0.7–4.0)
MCH: 26.1 pg (ref 26.0–34.0)
MCHC: 32.4 g/dL (ref 30.0–36.0)
MCV: 80.5 fL (ref 78.0–100.0)
MONO ABS: 0.7 10*3/uL (ref 0.1–1.0)
MONOS PCT: 6 %
NEUTROS ABS: 9.3 10*3/uL — AB (ref 1.7–7.7)
Neutrophils Relative %: 85 %
Platelets: 519 10*3/uL — ABNORMAL HIGH (ref 150–400)
RBC: 3.64 MIL/uL — ABNORMAL LOW (ref 3.87–5.11)
RDW: 14.3 % (ref 11.5–15.5)
WBC: 10.9 10*3/uL — ABNORMAL HIGH (ref 4.0–10.5)

## 2016-01-24 LAB — COMPREHENSIVE METABOLIC PANEL
ALBUMIN: 1.9 g/dL — AB (ref 3.5–5.0)
ALT: 22 U/L (ref 14–54)
ANION GAP: 6 (ref 5–15)
AST: 27 U/L (ref 15–41)
Alkaline Phosphatase: 137 U/L — ABNORMAL HIGH (ref 38–126)
BILIRUBIN TOTAL: 0.6 mg/dL (ref 0.3–1.2)
BUN: <5 mg/dL — ABNORMAL LOW (ref 6–20)
CO2: 31 mmol/L (ref 22–32)
Calcium: 8.1 mg/dL — ABNORMAL LOW (ref 8.9–10.3)
Chloride: 93 mmol/L — ABNORMAL LOW (ref 101–111)
Creatinine, Ser: 0.8 mg/dL (ref 0.44–1.00)
GFR calc Af Amer: >60 mL/min (ref >60)
GFR calc non Af Amer: >60 mL/min (ref >60)
GLUCOSE: 107 mg/dL — AB (ref 65–99)
POTASSIUM: 4.2 mmol/L (ref 3.5–5.1)
Sodium: 130 mmol/L — ABNORMAL LOW (ref 135–145)
TOTAL PROTEIN: 4.8 g/dL — AB (ref 6.5–8.1)

## 2016-01-24 MED ORDER — SODIUM CHLORIDE 0.9 % IV SOLN
3.0000 g | Freq: Three times a day (TID) | INTRAVENOUS | Status: DC
  Administered 2016-01-24 – 2016-01-25 (×2): 3 g via INTRAVENOUS
  Filled 2016-01-24 (×4): qty 3

## 2016-01-24 NOTE — Progress Notes (Signed)
Referring Physician(s): Dr Landis GandyJ Samtani  Supervising Physician: Gilmer MorWagner, Jaime  Patient Status:  Inpatient  Chief Complaint:  Perihepatic abscess  Subjective:  8/28: Interventional Radiology Procedure Note Procedure:  CT guided drainage of right retroperitoneal/perihepatic abscess Complications: None  Up in chair Feeling better Eating better PT has evaluated and moving well Denies pain  Allergies: Ciprofloxacin; Doxycycline; Erythromycin; and Hepatitis a vaccine  Medications: Prior to Admission medications   Medication Sig Start Date End Date Taking? Authorizing Provider  acetaminophen (TYLENOL) 325 MG tablet Take 650 mg by mouth every 6 (six) hours as needed for mild pain or moderate pain.   Yes Historical Provider, MD  aspirin EC 81 MG tablet Take 81 mg by mouth daily.   Yes Historical Provider, MD  Calcium-Magnesium-Zinc [phone] MG TABS Take 1 tablet by mouth daily.   Yes Historical Provider, MD  celecoxib (CELEBREX) 200 MG capsule Take 200 mg by mouth 2 (two) times daily as needed for mild pain or moderate pain.   Yes Historical Provider, MD  Cholecalciferol 2000 UNITS CAPS Take 1 capsule by mouth daily.   Yes Historical Provider, MD  co-enzyme Q-10 30 MG capsule Take 30 mg by mouth 2 (two) times a week.    Yes Historical Provider, MD  CYANOCOBALAMIN PO Take 1 tablet by mouth daily.   Yes Historical Provider, MD  esomeprazole (NEXIUM) 20 MG capsule Take 20 mg by mouth daily as needed (reflux).    Yes Historical Provider, MD  fluorometholone (FML) 0.1 % ophthalmic suspension Place 1 drop into both eyes 2 (two) times daily. 11/05/15  Yes Historical Provider, MD  KRILL OIL ULTRA STRENGTH PO Take 1 capsule by mouth daily.   Yes Historical Provider, MD  metoCLOPramide (REGLAN) 5 MG tablet Take 5 mg by mouth 3 (three) times daily as needed. 11/06/15  Yes Historical Provider, MD  Multiple Vitamin (MULTIVITAMIN WITH MINERALS) TABS tablet Take 1 tablet by mouth daily.   Yes  Historical Provider, MD  Probiotic Product (TRUNATURE DIGESTIVE PROBIOTIC) CAPS Take 1 capsule by mouth daily.   Yes Historical Provider, MD  sodium chloride (OCEAN) 0.65 % SOLN nasal spray Place 1 spray into both nostrils as needed for congestion.   Yes Historical Provider, MD  traMADol (ULTRAM) 50 MG tablet Take 1 tablet by mouth 2 (two) times daily. 12/26/15  Yes Historical Provider, MD  vitamin C (ASCORBIC ACID) 500 MG tablet Take 500 mg by mouth daily.   Yes Historical Provider, MD     Vital Signs: BP 128/70 (BP Location: Left Arm)   Pulse 89   Temp 97.8 F (36.6 C) (Oral)   Resp 18   Ht 5\' 5"  (1.651 m)   Wt 184 lb (83.5 kg)   SpO2 97%   BMI 30.62 kg/m   Physical Exam  Constitutional: She is oriented to person, place, and time.  Neurological: She is alert and oriented to person, place, and time.  Skin: Skin is warm and dry.  RP abscess drain intact Skin site clean and dry Output 215 cc yesterday 20 cc in JP Yellow milky fluid +strept; beta lac +  Wbc 10.9 afeb    Nursing note and vitals reviewed.   Imaging: X-ray Chest Pa And Lateral  Result Date: 01/20/2016 CLINICAL DATA:  Pleural effusion. EXAM: CHEST  2 VIEW COMPARISON:  01/20/2016, earlier the same day.  12/19/2015. FINDINGS: Lower lung volumes with increased atelectasis at the right lung base. Small right pleural effusion again noted. New atelectasis seen at the left  lung base on this lower volume film. Low lung volumes accentuate the cardiopericardial silhouette. Bones are diffusely demineralized. IMPRESSION: Low volume film with increased bibasilar atelectasis and small right pleural effusion. Electronically Signed   By: Kennith Center M.D.   On: 01/20/2016 16:32   [place] Chest  Result Date: 01/20/2016 CLINICAL DATA:  Suspected right pleural effusion. EXAM: CHEST ULTRASOUND COMPARISON:  Chest radiograph 01/20/2016 FINDINGS: Focused right chest wall ultrasound demonstrates small right subpulmonic effusion with maximum  thickness of 12 mm, not amenable to thoracocentesis. IMPRESSION: Small right pleural effusion. Electronically Signed   By: Ted Mcalpine M.D.   On: 01/20/2016 11:36   Ct Abdomen Pelvis W Contrast  Result Date: 01/20/2016 CLINICAL DATA:  Liver abscess. EXAM: CT ABDOMEN AND PELVIS WITH CONTRAST TECHNIQUE: Multidetector CT imaging of the abdomen and pelvis was performed using the standard protocol following bolus administration of intravenous contrast. CONTRAST:  100 mL Isovue 300 COMPARISON:  Chest CT 12/27/2015 FINDINGS: Lower chest: Band of atelectasis in the LEFT and RIGHT lower lobe. Small bilateral pleural effusions. Hepatobiliary: The inferior aspect of the RIGHT hepatic lobe is a 3.1 by 2.4 cm hypodense lesion with ill-defined margins which is decreased from 3.4 by 3.4 cm on prior. However, there is a complex fluid collection adjacent to RIGHT hepatic lobe which is increased in size compared to prior measuring 10.2 by 3.2 cm (image 24, series 201) increased from 5.7 by 2.2 cm. This perihepatic fluid collection has internal septations and gas locules. Fluid collections extend along the RIGHT retroperitoneal space into the iliac fossa. Collection measures 5.9 by 4.8 cm (image 46, series 201) at the level of the iliac crest. Fluid collection extends anterior to the iliacus muscle the level of the RIGHT external iliac artery measuring 4.4 by 3.2 cm this level (image 64, series 201). There is thickening along the lateral aspect of the the para renal fascia (image 30, series 201. The remaining of the liver parenchyma is normal. There multiple large gallstones within the lumen gallbladder ranging in size from 12 to 20 mm. The gallbladder distension. Common bile duct normal. Pancreas: Normal Spleen: Normal spleen Adrenals/urinary tract: There is ill-defined tissue associated the inferior aspect of the LEFT adrenal gland (image 26, series 201. RIGHT adrenal glands normal. The kidneys enhance symmetrically.  Ureters appear normal. Bladder normal Stomach/Bowel: The stomach, small bowel normal. There is some thickening along the terminal ileum. There is thickening along the ascending colon just above the cecum. This is adjacent to the retroperitoneal fluid collection / infection may be secondary inflammation. Vascular/Lymphatic:  Clear calcification Reproductive: Uterus and ovaries normal. Other: No free fluid. Musculoskeletal: No aggressive osseous lesion. IMPRESSION: 1. Retroperitoneal fluid collection extends from the RIGHT iliac fossa to the RIGHT hepatic lobe margin consistent with large RETROPERITONEAL ABSCESS. There septation and gas collection in the most superior aspect adjacent to the liver with mass-effect on liver. Recommend percutaneous drainage and cytology of the fluid collection RIGHT lateral to the liver. 2. Presumed hepatic abscess in the most inferior RIGHT hepatic lobe is slightly smaller. 3. Ill-defined tissue associated with the LEFT adrenal gland is indeterminate. Cannot exclude malignancy. Recommend correlation with cytology as above. 4. Thicken along the perirenal fascia laterally adjacent to the RIGHT kidney. Differential includes infection or malignancy. 5. Thickening of the terminal ileum and ascending colon may be secondary to the retroperitoneal infection. 6. Cholelithiasis. These results will be called to the ordering clinician or representative by the Radiologist Assistant, and communication documented in the PACS or  zVision Dashboard. Electronically Signed   By: Genevive Bi M.D.   On: 01/20/2016 16:29   Ct Image Guided Drainage By Percutaneous Catheter  Result Date: 01/21/2016 CLINICAL DATA:  Right-sided retroperitoneal abscess extending superiorly to abut the liver. The patient presents for percutaneous drainage. EXAM: CT GUIDED DRAINAGE OF RETROPERITONEAL ABSCESS ANESTHESIA/SEDATION: 1.0 Mg IV Versed 50 mcg IV Fentanyl Total Moderate Sedation Time:  26 minutes The patient's  level of consciousness and physiologic status were continuously monitored during the procedure by Radiology nursing. PROCEDURE: The procedure, risks, benefits, and alternatives were explained to the patient. Questions regarding the procedure were encouraged and answered. The patient understands and consents to the procedure. A time-out was performed prior to initiating the procedure. The right abdominal wall was prepped with chlorhexidine in a sterile fashion, and a sterile drape was applied covering the operative field. A sterile gown and sterile gloves were used for the procedure. Local anesthesia was provided with 1% Lidocaine. Imaging through the abdomen was performed in a supine position. Under CT guidance, an 18 gauge needle was advanced into a perihepatic abscess. A guidewire was advanced through the needle. The percutaneous tract was dilated and a 12 French pigtail drainage catheter placed. Fluid aspiration was performed with a sample sent for culture analysis. The catheter was flushed and connected to a suction bulb. The catheter was secured at skin with a Prolene retention suture and adhesive StatLock device. COMPLICATIONS: None FINDINGS: The lower perihepatic component of an elongated and predominantly retroperitoneal abscess yielded purulent fluid. After drainage catheter placement, there is good return of purulent fluid. IMPRESSION: CT-guided percutaneous drainage of abscess adjacent to the inferior liver. Grossly purulent fluid return noted with a sample sent for culture analysis. A 12 French drain was placed and attached to suction bulb drainage. Electronically Signed   By: Irish Lack M.D.   On: 01/21/2016 16:26    Labs:  CBC:  Recent Labs  01/20/16 0613 01/21/16 0126 01/22/16 0951 01/24/16 0349  WBC 27.0* 26.2* 21.0* 10.9*  HGB 9.5* 9.4* 9.6* 9.5*  HCT 28.7* 27.8* 29.3* 29.3*  PLT 533* 486* 477* 519*    COAGS:  Recent Labs  12/19/15 1154 01/20/16 1627  INR 1.03 1.28    APTT 34 36    BMP:  Recent Labs  01/21/16 0126 01/22/16 0951 01/23/16 0412 01/24/16 0349  NA 123* 124* 128* 130*  K 3.1* 3.5 2.9* 4.2  CL 84* 89* 90* 93*  CO2 28 27 29 31   GLUCOSE 137* 126* 95 107*  BUN <5* 8 7 <5*  CALCIUM 7.8* 7.6* 7.9* 8.1*  CREATININE 0.79 0.88 0.85 0.80  GFRNONAA >60 >60 >60 >60  GFRAA >60 >60 >60 >60    LIVER FUNCTION TESTS:  Recent Labs  01/20/16 0439 01/21/16 0126 01/24/16 0349  BILITOT 0.9 0.8 0.6  AST 27 23 27   ALT 19 18 22   ALKPHOS 185* 152* 137*  PROT 6.0* 5.1* 4.8*  ALBUMIN 2.2* 1.9* 1.9*    Assessment and Plan:  RP/perihepatic abscess drain in place + strept/beta lac+ Will follow  Electronically Signed: Richele Strand A 01/24/2016, 10:31 AM   I spent a total of 15 Minutes at the the patient's bedside AND on the patient's hospital floor or unit, greater than 50% of which was counseling/coordinating care for perihep abscess drain

## 2016-01-24 NOTE — Care Management Important Message (Signed)
Important Message  Patient Details  Name: Shannon Montgomery MRN: [id number] Date of Birth: [birth year]   Medicare Important Message Given:  Yes    Salimah Martinovich Stefan ChurchBratton 01/24/2016, 10:57 AM

## 2016-01-24 NOTE — Progress Notes (Signed)
Physical Therapy Treatment Patient Details Name: Shannon Montgomery MRN: [id number] DOB: 1934/11/19 Today's Date: 01/24/2016    History of Present Illness Pt admitted with hyponatremia after falling at home. Pt with poor appetite and dehydration. CT guided drainage of right retroperitoneal/perihepatic abscess 01/21/16, has drain. PMH: recent dx of liver lesions and R lung opacity as she was preparing for a R TKA, fibromyalgia, polymyalgia rheumatica.    PT Comments    Pt tolerated increased distance with ambulation, mild unsteadiness but no loss of balance, pt declined use of assistive device. Encouraged pt to consider use of cane or RW for safety. She has good support from her husband.   Follow Up Recommendations  Home health PT;Supervision/Assistance - 24 hour     Equipment Recommendations  Rolling walker with 5" wheels;3in1 (PT)    Recommendations for Other Services       Precautions / Restrictions Precautions Precautions: Fall Precaution Comments: R hepatic drain Restrictions Weight Bearing Restrictions: No    Mobility  Bed Mobility Overal bed mobility: Modified Independent             General bed mobility comments: increased time, no physical assist or use of rail, bed nearly flat  Transfers Overall transfer level: Needs assistance Equipment used: 1 person hand held assist Transfers: Sit to/from Stand Sit to Stand: Min guard         General transfer comment: Overall good rise; minguard for steadiness  Ambulation/Gait Ambulation/Gait assistance: Min guard Ambulation Distance (Feet): 140 Feet Assistive device:  (intermittent use of hallway rail) Gait Pattern/deviations: Step-through pattern;Decreased stride length;Decreased step length - right;Decreased step length - left   Gait velocity interpretation: Below normal speed for age/gender General Gait Details: mild unsteadiness but no LOB, with pt naturally reaching out for UE support from hallway rail; she  declined RW several times; pt states at baseline she's able to walk "farther and better"   Stairs            Wheelchair Mobility    Modified Rankin (Stroke Patients Only)       Balance             Standing balance-Leahy Scale: Fair                      Cognition Arousal/Alertness: Awake/alert Behavior During Therapy: WFL for tasks assessed/performed Overall Cognitive Status: Within Functional Limits for tasks assessed                      Exercises      General Comments        Pertinent Vitals/Pain Pain Assessment: 0-10 Pain Score: 8  Pain Location: R ankle -chronic pain with walking Pain Descriptors / Indicators: Sore Pain Intervention(s): Monitored during session;Limited activity within patient's tolerance (pt declined ice, pain meds)    Home Living                      Prior Function            PT Goals (current goals can now be found in the care plan section) Acute Rehab PT Goals Patient Stated Goal: go walking at [employer], cook with husband PT Goal Formulation: With patient/family Time For Goal Achievement: 02/06/16 Potential to Achieve Goals: Good Progress towards PT goals: Progressing toward goals    Frequency  Min 3X/week    PT Plan Current plan remains appropriate    Co-evaluation  End of Session Equipment Utilized During Treatment: Gait belt Activity Tolerance: Patient tolerated treatment well Patient left: with call bell/phone within reach;with family/visitor present;in chair     Time: 4098-1191 PT Time Calculation (min) (ACUTE ONLY): 24 min  Charges:  $Gait Training: 23-37 mins                    G Codes:      Tamala Ser 01/24/2016, 9:41 AM [phone]

## 2016-01-24 NOTE — Progress Notes (Signed)
PROGRESS NOTE                                                                                                                                                                                                             81 y/o female with PMH of Fibromyalgia,   PMR who was on steroids in the past for 2 years,  Hyponatremia, recently found to have liver lesions and right lung opacity on ct scan (8/3) took ciprofloxacin for 2 days as outpatient for possible pneumonia but did not follow up with ct scan of the ad/pelvis presented with weakness, tiredness and found to have hyponatremia with sodium 123.  CT scan abdomen and pelvis this admission shows large retroperitoneal abscess, liver abscess and possible nonspecific changes in the right adrenal gland suspicious for malignancy.   Subjective:   Much better Eating and drinking better No n/v/cp No fever    Assessment  & Plan :    1.Large retroperitoneal abscess, right lobe liver abscess.  Nonspecific left adrenal gland changes.  Blood cultures ngtd Drain cult growing Bacteroides + Strep Constellatas, and fluid is clearing cont IV Zosyn,  IR consulted and she underwent retroperitoneal drain placement on 01/21/2016, follow culture results,  surgery has been consulted as well and they have following,  outpatient ID follow-up discussed her case with Dr. Orvan Falconer ID on 01/21/2016 requires 4-6 weeks of antibiotics based -likely augmentin  cytology on the drain fluid is neg for malignancy  2. Hyponatremia. Her baseline seems to be close to 130, urine and serum osmolality along with urine sodium suggest most likely SIaDH,  Stopped  IV fluids restrict total fluid intake and give gentle Lasix 1 on 01/22/2016, repeat BMP shows some resolution Rpt labs again am 9/1 show sodium is 130  3. Osteoarthritis, fibromyalgia, questionable underlying PMR  - supportive care.  4. GERD.  On  PPI.  5. Hypertension.  Stable off medications.  6. Nonspecific left adrenal gland changes noted on CT scan. Outpatient urology follow-up, follow cytology results from the drained fluid  7. Hypokalemia Replace with oral kdur 40 bid Rpt labs am.   Family Communication  : None present  Code Status :  Full  Diet : hh diet Disposition Plan  :  Stay inpatient  Consults  :  IR, general surgery, ID Dr. Orvan Falconer over the phone  Procedures  :    CT scan abdomen and pelvis with large retroperitoneal abscess, possible liver abscess, nonspecific adrenal gland changes.  Abdominal drain placement by IR on 01/21/2016  DVT Prophylaxis  :   Heparin    Lab Results  Component Value Date   PLT 519 (H) 01/24/2016    Inpatient Medications  Scheduled Meds: . aspirin EC  81 mg Oral Daily  . cholecalciferol  2,000 Units Oral Daily  . fluorometholone  1 drop Both Eyes BID  . heparin subcutaneous  5,000 Units Subcutaneous Q8H  . pantoprazole  40 mg Oral Daily  . potassium chloride  40 mEq Oral BID  . saccharomyces boulardii  250 mg Oral Daily   Continuous Infusions:   PRN Meds:.acetaminophen, bisacodyl, celecoxib, magnesium citrate, metoCLOPramide, ondansetron **OR** ondansetron (ZOFRAN) IV, senna-docusate, traMADol, traZODone  Antibiotics  :    Anti-infectives    Start     Dose/Rate Route Frequency Ordered Stop   01/20/16 0900  piperacillin-tazobactam (ZOSYN) IVPB 3.375 g  Status:  Discontinued     3.375 g 12.5 mL/hr over 240 Minutes Intravenous Every 8 hours 01/20/16 0855 01/24/16 1553   01/20/16 0900  vancomycin (VANCOCIN) IVPB 750 mg/150 ml premix  Status:  Discontinued     750 mg 150 mL/hr over 60 Minutes Intravenous Every 12 hours 01/20/16 0855 01/21/16 0955         Objective:   Vitals:   01/23/16 1730 01/23/16 2055 01/24/16 0519 01/24/16 1009  BP: 132/72 (!) 150/75 (!) 148/79 128/70  Pulse: 82 80 85 89  Resp: 18 19 20 18   Temp: 98 F (36.7 C) 98.2 F (36.8 C) 97.8  F (36.6 C) 97.8 F (36.6 C)  TempSrc: Oral Oral Oral Oral  SpO2: 100% 98% 98% 97%  Weight:      Height:        Wt Readings from Last 3 Encounters:  01/22/16 83.5 kg (184 lb)  12/19/15 82.3 kg (181 lb 6.4 oz)  06/08/13 90.7 kg (200 lb)     Intake/Output Summary (Last 24 hours) at 01/24/16 1554 Last data filed at 01/24/16 1518  Gross per 24 hour  Intake             1690 ml  Output              965 ml  Net              725 ml     Physical Exam  Awake Alert, Oriented X 3, No new F.N deficits, Normal affect [place].AT,PERRAL Supple Neck,No JVD, No cervical LAN Symmetrical Chest wall movement, Good air movement bilaterally, CTAB RRR,No Gallops,no m/r/g Le's no swelling  Drain seems to have less turbid fluid and more milky appearing colour    Data Review:    CBC  Recent Labs Lab 01/20/16 0613 01/21/16 0126 01/22/16 0951 01/24/16 0349  WBC 27.0* 26.2* 21.0* 10.9*  HGB 9.5* 9.4* 9.6* 9.5*  HCT 28.7* 27.8* 29.3* 29.3*  PLT 533* 486* 477* 519*  MCV 79.7 78.8 80.3 80.5  MCH 26.4 26.6 26.3 26.1  MCHC 33.1 33.8 32.8 32.4  RDW 14.1 14.5 14.2 14.3  LYMPHSABS 0.5*  --   --  0.7  MONOABS 1.1*  --   --  0.7  EOSABS 0.0  --   --  0.2  BASOSABS 0.0  --   --  0.0    Chemistries   Recent Labs Lab 01/20/16 0439  01/20/16 1627 01/21/16 0126 01/22/16 [id number] 01/23/16 [id number]  01/24/16 0349  NA 123*  < > 120* 123* 124* 128* 130*  K 3.5  < > 3.3* 3.1* 3.5 2.9* 4.2  CL 84*  < > 85* 84* 89* 90* 93*  CO2 26  < > 22 28 27 29 31   GLUCOSE 132*  < > 144* 137* 126* 95 107*  BUN 7  < > 6 <5* 8 7 <5*  CREATININE 0.97  < > 0.76 0.79 0.88 0.85 0.80  CALCIUM 8.1*  < > 7.7* 7.8* 7.6* 7.9* 8.1*  MG  --   --   --   --  1.2* 1.5*  --   AST 27  --   --  23  --   --  27  ALT 19  --   --  18  --   --  22  ALKPHOS 185*  --   --  152*  --   --  137*  BILITOT 0.9  --   --  0.8  --   --  0.6  < > = values in this interval not  displayed. ------------------------------------------------------------------------------------------------------------------ No results for input(s): CHOL, HDL, LDLCALC, TRIG, CHOLHDL, LDLDIRECT in the last 72 hours.  No results found for: HGBA1C ------------------------------------------------------------------------------------------------------------------ No results for input(s): TSH, T4TOTAL, T3FREE, THYROIDAB in the last 72 hours.  Invalid input(s): FREET3 ------------------------------------------------------------------------------------------------------------------ No results for input(s): VITAMINB12, FOLATE, FERRITIN, TIBC, IRON, RETICCTPCT in the last 72 hours.  Coagulation profile  Recent Labs Lab 01/20/16 1627  INR 1.28    No results for input(s): DDIMER in the last 72 hours.  Cardiac Enzymes No results for input(s): CKMB, TROPONINI, MYOGLOBIN in the last 168 hours.  Invalid input(s): CK ------------------------------------------------------------------------------------------------------------------ No results found for: BNP  Micro Results Recent Results (from the past 240 hour(s))  Urine culture     Status: None   Collection Time: 01/20/16  5:00 AM  Result Value Ref Range Status   Specimen Description URINE, CATHETERIZED  Final   Special Requests NONE  Final   Culture NO GROWTH  Final   Report Status 01/21/2016 FINAL  Final  Culture, blood (Routine X 2) w Reflex to ID Panel     Status: None (Preliminary result)   Collection Time: 01/20/16  2:15 PM  Result Value Ref Range Status   Specimen Description BLOOD LEFT ANTECUBITAL  Final   Special Requests BOTTLES DRAWN AEROBIC AND ANAEROBIC 10CC  Final   Culture NO GROWTH 4 DAYS  Final   Report Status PENDING  Incomplete  Culture, blood (Routine X 2) w Reflex to ID Panel     Status: None (Preliminary result)   Collection Time: 01/20/16  2:20 PM  Result Value Ref Range Status   Specimen Description BLOOD BLOOD  LEFT HAND  Final   Special Requests BOTTLES DRAWN AEROBIC AND ANAEROBIC 5CC  Final   Culture NO GROWTH 4 DAYS  Final   Report Status PENDING  Incomplete  Aerobic/Anaerobic Culture (surgical/deep wound)     Status: None (Preliminary result)   Collection Time: 01/21/16  5:15 PM  Result Value Ref Range Status   Specimen Description ABSCESS  Final   Special Requests RIGHT UPPER QUADRANT  Final   Gram Stain   Final    ABUNDANT WBC PRESENT, PREDOMINANTLY PMN ABUNDANT GRAM POSITIVE COCCI IN PAIRS IN CHAINS FEW GRAM NEGATIVE RODS    Culture   Final    ABUNDANT STREPTOCOCCUS CONSTELLATUS MODERATE BACTEROIDES FRAGILIS BETA LACTAMASE POSITIVE    Report Status PENDING  Incomplete    Radiology  Reports X-ray Chest Pa And Lateral  Result Date: 01/20/2016 CLINICAL DATA:  Pleural effusion. EXAM: CHEST  2 VIEW COMPARISON:  01/20/2016, earlier the same day.  12/19/2015. FINDINGS: Lower lung volumes with increased atelectasis at the right lung base. Small right pleural effusion again noted. New atelectasis seen at the left lung base on this lower volume film. Low lung volumes accentuate the cardiopericardial silhouette. Bones are diffusely demineralized. IMPRESSION: Low volume film with increased bibasilar atelectasis and small right pleural effusion. Electronically Signed   By: Kennith Center M.D.   On: 01/20/2016 16:32   Dg Chest 2 View  Result Date: 01/20/2016 CLINICAL DATA:  Chest pain tonight. Multiple falls over the past week. EXAM: CHEST  2 VIEW COMPARISON:  Radiographs 12/19/2015.  CT 12/27/2015 FINDINGS: Again seen elevation of the right hemidiaphragm. Increased linear opacity in the adjacent right lung base, may be increased atelectasis or fluid in the fissure, suspect mild increase in right pleural effusion. Tiny left pleural effusion with blunting of the costophrenic angle. Unchanged heart size and aortic contour is allowing for differences in technique. Thoracic aortic atherosclerosis. Increased  left basilar atelectasis. No pulmonary edema or pneumothorax. Compression deformity in the lower thoracic spine, as seen on CT. IMPRESSION: 1. Increased linear opacity adjacent the right lung base, may be increased atelectasis or fluid in the fissure. 2. Suspect increased in small right pleural effusion, development small left pleural effusion. Electronically Signed   By: Rubye Oaks M.D.   On: 01/20/2016 05:59   Ct Chest W Contrast  Result Date: 12/27/2015 CLINICAL DATA:  Small right pleural effusion questioned on chest x-ray, preop for total knee replacement EXAM: CT CHEST WITH CONTRAST TECHNIQUE: Multidetector CT imaging of the chest was performed during intravenous contrast administration. CONTRAST:  75mL ISOVUE-300 IOPAMIDOL (ISOVUE-300) INJECTION 61% COMPARISON:  Chest x-ray of 12/19/2015 FINDINGS: Cardiovascular: There is good opacification of the thoracic aorta. The mid ascending thoracic aorta measures 36 mm in diameter. The pulmonary arteries are opacified as well with no central abnormality evident. Coronary artery calcifications are present in the distribution of the left anterior descending coronary artery. No pericardial effusion is seen. Moderate thoracic aortic atherosclerosis is present. Mediastinum/Nodes: No mediastinal or hilar adenopathy is noted. The thyroid gland is unremarkable. Lungs/Pleura: On lung window images, only a tiny right pleural effusion is present. The majority the opacity at the posterior right lung base appears to be due to atelectasis involving both the right lower lobe and the medial right middle lobe possibly as result of chronic elevation of the right hemidiaphragm. An inflammatory process cannot be excluded, but I would favor this being a chronic process. The left lung is clear other than mild linear scarring in the posterior left lower lobe. The central bronchi are patent. No suspicious lung nodule or mass is seen. Upper Abdomen: However, there is a definite  abnormality within the posterior aspect of the right lobe of liver. On axial image 116 series 4 there is a rounded lesion in the posterior right lobe of liver of 5.0 x 4.8 cm with probable adjacent fluid. More caudally there is a lower attenuation portion of this lesion measuring 3.4 x 3.5 cm with attenuation of 15 HU. Immediately adjacent extending along the posterior capsule of the right posterior lobe of liver there is a collection of fluid or soft tissue measuring 17 HU extending into the right retroperitoneum. This collection also appears to invade the adjacent fat planes and possibly the right lower posterolateral chest wall. Although these changes  could be inflammatory in nature, a neoplastic process is a definite consideration. CT of the abdomen pelvis with oral and IV contrast media is recommended. Incidental gallstones also are noted within the gallbladder, the largest of 2.9 cm. No gallbladder wall thickening is seen. A few minimally prominent lymph nodes are present near the porta hepatis. Musculoskeletal: On the sagittal view there appears to be compression deformity of T11 and L2 of uncertain acuity. Also there is irregularity of the sternum most likely due to prior fracture with healing. Clinical correlation is recommended. IMPRESSION: 1. Abnormality within the posterior right lobe of liver with adjacent fluid and/or soft tissue extending more caudally. Some of this soft tissue does appear to invade the fat planes of the adjacent right posterolateral chest wall. Although these changes could be inflammatory, a neoplastic process is a definite consideration. Recommend CT of the abdomen pelvis with oral and IV contrast media. 2. Incidental gallstones. 3. Opacity at the right lung base is most likely due to chronic atelectasis with elevation of the right hemidiaphragm also involving the medial right middle lobe. Only a tiny right pleural effusion is seen. This opacity may also be in part due to the  process involving the posterior right lobe of liver as described above. 4. Partial compression deformity of T11 and possibly L2 of uncertain acuity. 5. Probable old fracture of the sternum.  Correlate clinically. 6. Moderate thoracic aortic atherosclerosis. Electronically Signed   By: Dwyane Dee M.D.   On: 12/27/2015 16:24   [place] Chest  Result Date: 01/20/2016 CLINICAL DATA:  Suspected right pleural effusion. EXAM: CHEST ULTRASOUND COMPARISON:  Chest radiograph 01/20/2016 FINDINGS: Focused right chest wall ultrasound demonstrates small right subpulmonic effusion with maximum thickness of 12 mm, not amenable to thoracocentesis. IMPRESSION: Small right pleural effusion. Electronically Signed   By: Ted Mcalpine M.D.   On: 01/20/2016 11:36   Ct Abdomen Pelvis W Contrast  Result Date: 01/20/2016 CLINICAL DATA:  Liver abscess. EXAM: CT ABDOMEN AND PELVIS WITH CONTRAST TECHNIQUE: Multidetector CT imaging of the abdomen and pelvis was performed using the standard protocol following bolus administration of intravenous contrast. CONTRAST:  100 mL Isovue 300 COMPARISON:  Chest CT 12/27/2015 FINDINGS: Lower chest: Band of atelectasis in the LEFT and RIGHT lower lobe. Small bilateral pleural effusions. Hepatobiliary: The inferior aspect of the RIGHT hepatic lobe is a 3.1 by 2.4 cm hypodense lesion with ill-defined margins which is decreased from 3.4 by 3.4 cm on prior. However, there is a complex fluid collection adjacent to RIGHT hepatic lobe which is increased in size compared to prior measuring 10.2 by 3.2 cm (image 24, series 201) increased from 5.7 by 2.2 cm. This perihepatic fluid collection has internal septations and gas locules. Fluid collections extend along the RIGHT retroperitoneal space into the iliac fossa. Collection measures 5.9 by 4.8 cm (image 46, series 201) at the level of the iliac crest. Fluid collection extends anterior to the iliacus muscle the level of the RIGHT external iliac artery  measuring 4.4 by 3.2 cm this level (image 64, series 201). There is thickening along the lateral aspect of the the para renal fascia (image 30, series 201. The remaining of the liver parenchyma is normal. There multiple large gallstones within the lumen gallbladder ranging in size from 12 to 20 mm. The gallbladder distension. Common bile duct normal. Pancreas: Normal Spleen: Normal spleen Adrenals/urinary tract: There is ill-defined tissue associated the inferior aspect of the LEFT adrenal gland (image 26, series 201. RIGHT adrenal glands normal.  The kidneys enhance symmetrically. Ureters appear normal. Bladder normal Stomach/Bowel: The stomach, small bowel normal. There is some thickening along the terminal ileum. There is thickening along the ascending colon just above the cecum. This is adjacent to the retroperitoneal fluid collection / infection may be secondary inflammation. Vascular/Lymphatic:  Clear calcification Reproductive: Uterus and ovaries normal. Other: No free fluid. Musculoskeletal: No aggressive osseous lesion. IMPRESSION: 1. Retroperitoneal fluid collection extends from the RIGHT iliac fossa to the RIGHT hepatic lobe margin consistent with large RETROPERITONEAL ABSCESS. There septation and gas collection in the most superior aspect adjacent to the liver with mass-effect on liver. Recommend percutaneous drainage and cytology of the fluid collection RIGHT lateral to the liver. 2. Presumed hepatic abscess in the most inferior RIGHT hepatic lobe is slightly smaller. 3. Ill-defined tissue associated with the LEFT adrenal gland is indeterminate. Cannot exclude malignancy. Recommend correlation with cytology as above. 4. Thicken along the perirenal fascia laterally adjacent to the RIGHT kidney. Differential includes infection or malignancy. 5. Thickening of the terminal ileum and ascending colon may be secondary to the retroperitoneal infection. 6. Cholelithiasis. These results will be called to the  ordering clinician or representative by the Radiologist Assistant, and communication documented in the PACS or zVision Dashboard. Electronically Signed   By: Genevive Bi M.D.   On: 01/20/2016 16:29    Time Spent in minutes  25  Pleas Koch, MD [facility] [phone]

## 2016-01-24 NOTE — Progress Notes (Signed)
Pharmacy Antibiotic Note  Shannon Montgomery is a 81 y.o. female admitted on 01/20/2016 with intra-abdominal infection.  Pharmacy has been consulted for Unasyn dosing.  Plan: Start Unasyn 3 gram IV q 8 hours  Height: 5\' 5"  (165.1 cm) Weight: 184 lb (83.5 kg) IBW/kg (Calculated) : 57  Temp (24hrs), Avg:97.9 F (36.6 C), Min:97.8 F (36.6 C), Max:98.2 F (36.8 C)   Recent Labs Lab 01/20/16 0450 01/20/16 [id number]  01/20/16 1413 01/20/16 1627 01/21/16 0126 01/22/16 0951 01/23/16 0412 01/24/16 0349  WBC  --  27.0*  --   --   --  26.2* 21.0*  --  10.9*  CREATININE  --   --   < >  --  0.76 0.79 0.88 0.85 0.80  LATICACIDVEN 1.93*  --   --  1.7 1.4  --   --   --   --   < > = values in this interval not displayed.  Estimated Creatinine Clearance: 60.9 mL/min (by C-G formula based on SCr of 0.8 mg/dL).    Allergies  Allergen Reactions  . Ciprofloxacin Nausea Only  . Doxycycline Nausea Only  . Erythromycin Nausea Only  . Hepatitis A Vaccine Swelling    Antimicrobials this admission: 8/27 Vancomycin >> 8/28 8/27 Zosyn >> 8/31 8/31 Unasyn>>   Microbiology results: 8/27 UCx: negative/F 8/27 Bld x2: ngtd  8/28 right upper quadrant abscess:  ABUNDANT STREPTOCOCCUS CONSTELLATUS, MODERATE BACTEROIDES FRAGILIS, pending   Thank you for allowing us to participate in this patients care. Signe Coltonya C Nava Song, PharmD Pager: [phone] 01/24/2016 4:06 PM

## 2016-01-25 DIAGNOSIS — J948 Other specified pleural conditions: Secondary | ICD-10-CM

## 2016-01-25 LAB — CULTURE, BLOOD (ROUTINE X 2)
CULTURE: NO GROWTH
Culture: NO GROWTH

## 2016-01-25 MED ORDER — AMOXICILLIN-POT CLAVULANATE 875-125 MG PO TABS
1.0000 | ORAL_TABLET | Freq: Two times a day (BID) | ORAL | Status: DC
  Administered 2016-01-25: 1 via ORAL
  Filled 2016-01-25: qty 1

## 2016-01-25 MED ORDER — AMOXICILLIN-POT CLAVULANATE 875-125 MG PO TABS: 1.0000 | ORAL_TABLET | Freq: Two times a day (BID) | ORAL | 0 refills | Status: DC

## 2016-01-25 NOTE — Care Management Note (Signed)
Case Management Note  Patient Details  Name: Shannon Montgomery MRN: [id number] Date of Birth: 20-Feb-1935  Subjective/Objective:    CM following for progression and d/c planning.                 Action/Plan: Pt and husband have used [facility] previously and [facility]   Expected Discharge Date:  01/21/16               Expected Discharge Plan:  Home w Home Health Services  In-House Referral:  NA  Discharge planning Services  CM Consult  Post Acute Care Choice:  Home Health Choice offered to:  Patient  DME Arranged:   NA DME Agency:  NA  HH Arranged:  RN HH Agency:  [facility]  Status of Service:  Completed, signed off  If discussed at Long Length of Stay Meetings, dates discussed:    Additional Comments:  Starlyn SkeansRoyal, Cathyann Kilfoyle U, RN 01/25/2016, 10:28 AM

## 2016-01-25 NOTE — Progress Notes (Signed)
Occupational Therapy Treatment Patient Details Name: Shannon LoreCarol A Montgomery MRN: [id number] DOB: [birth year] Today's Date: 01/25/2016    History of present illness Pt admitted with hyponatremia after falling at home. Pt with poor appetite and dehydration. CT guided drainage of right retroperitoneal/perihepatic abscess 01/21/16, has drain. PMH: recent dx of liver lesions and R lung opacity as she was preparing for a R TKA, fibromyalgia, polymyalgia rheumatica.   OT comments  Pt plans to DC this day. Needs walker - RN aware  Follow Up Recommendations  No OT follow up    Equipment Recommendations   (Rolling walker)    Recommendations for Other Services      Precautions / Restrictions Precautions Precautions: Fall Precaution Comments: R hepatic drain Restrictions Weight Bearing Restrictions: No       Mobility Bed Mobility               General bed mobility comments: pt in chair  Transfers Overall transfer level: Needs assistance Equipment used: Rolling walker (2 wheeled) Transfers: Sit to/from UGI CorporationStand;Stand Pivot Transfers Sit to Stand: Min guard Stand pivot transfers: Min guard                ADL                           Toilet Transfer: Min guard;Ambulation;BSC;RW   Toileting- ArchitectClothing Manipulation and Hygiene: Min guard;Sit to/from stand       Functional mobility during ADLs: Min guard General ADL Comments: husband will A as needed;  Spoke to nurse regarding need for walker prior to DC                Cognition   Behavior During Therapy: [facility]FL for tasks assessed/performed Overall Cognitive Status: Within Functional Limits for tasks assessed                               General Comments      Pertinent Vitals/ Pain       Pain Assessment: Faces Pain Location: r ankle Pain Descriptors / Indicators: Sore Pain Intervention(s): Monitored during session     Prior Functioning/Environment              Frequency Min 2X/week      Progress Toward Goals  OT Goals(current goals can now be found in the care plan section)  Progress towards OT goals: Progressing toward goals     Plan      Co-evaluation                 End of Session Equipment Utilized During Treatment: Rolling walker;Gait belt   Activity Tolerance Patient limited by fatigue   Patient Left in bed;with call bell/phone within reach   Nurse Communication          Time: [id number] OT Time Calculation (min): 14 min  Charges: OT General Charges $OT Visit: 1 Procedure OT Treatments $Self Care/Home Management : 8-22 mins  Natoshia Souter, Karin GoldenLorraine D 01/25/2016, 12:01 PM

## 2016-01-25 NOTE — Progress Notes (Signed)
PT Cancellation Note  Patient Details Name: Gypsy LoreCarol A Cretella MRN: [id number] DOB: [birth year]   Cancelled Treatment:    Reason Eval/Treat Not Completed: Patient declined, no reason specified.  Patient being d/c today.  Patient declined PT/stairs this am.  Reports she doesn't want to do anything until she gets home.  She and husband feel that patient can negotiate stairs to get into house.  Patient to be d/c home today with f/u HHPT.   Vena AustriaDavis, Sophira Rumler H 01/25/2016, 11:50 AM Durenda HurtSusan H. Renaldo Fiddleravis, PT, [facility] Acute Rehab Services Pager [phone]

## 2016-01-25 NOTE — Discharge Summary (Signed)
Physician Discharge Summary  KAYDI KLEY ZOX:[id number] DOB: 04/13/1935 DOA: 01/20/2016  PCP: Pearson Grippe, MD  Admit date: 01/20/2016 Discharge date: 01/25/2016  Time spent: 35 minutes  Recommendations for Outpatient Follow-up:  1. Patient will need antibiotics until 924 2017 with Augmentin twice a day 2. Patient will be discharged home with home health to manage drain 3. Interventional radiology to put formal recommendations for drain follow-up prior to discharge home-patient may benefit from imaging and further therapeutic and diagnostic testing as per interventional radiology input 4. The patient may benefit from outpatient re-imaging and if there is congruent see between that imaging and prior imaging regarding nonspecific adrenal changes, may warrant referral to a urologist as an outpatient 5. Needs CBC and differential probably within 1-2 weeks 6. No further workup thought necessary by infectious disease  Discharge Diagnoses:  Active Problems:   Vitamin D deficiency   Essential hypertension   Esophagitis   Hyponatremia   Fibromyalgia   PMR (polymyalgia rheumatica) (HCC)   Liver lesion, right lobe   Pleural effusion on right   Leukocytosis   Discharge Condition: stable   Diet recommendation: Heart healthy low-salt  Filed Weights   01/20/16 2100 01/21/16 2130 01/22/16 2004  Weight: 79.8 kg (175 lb 14.8 oz) 82.1 kg (181 lb 1.6 oz) 83.5 kg (184 lb)    History of present illness:  81 y/o female with PMH of Fibromyalgia,   PMR who was on steroids in the past for 2 years,  Hyponatremia, recently found to have liver lesions and right lung opacity on ct scan (8/3) took ciprofloxacin for 2 days as outpatient for possible pneumonia but did not follow up with ct scan of the ad/pelvis presented with weakness, tiredness and found to have hyponatremia with sodium 123.  CT scan abdomen and pelvis this admission shows large retroperitoneal abscess, liver abscess and possible nonspecific  changes in the right adrenal gland suspicious for malignancy.  Please see further workup and diagnostic testing as per below  Hospital Course:    1.Large retroperitoneal abscess, right lobe liver abscess.  Nonspecific left adrenal gland changes.  Blood cultures ngtd Drain cult growing Bacteroides + Strep Constellatas, and fluid is clearing Initially the patient was kept on vancomycin and IV Zosyn--> discussed her case with Dr. Orvan Falconer ID on 01/21/2016, who recommended by mouth Augmentin at least for 4 weeks IR consulted and she underwent retroperitoneal drain placement on 01/21/2016,  surgery has been consulted and she was determined not to have any acute surgical need after drain was placed  cytology on the drain fluid is neg for malignancy   2. Hyponatremia. Her baseline seems to be close to 130, urine and serum osmolality along with urine sodium suggest most likely SIaDH,  Stopped  IV fluids restrict total fluid intake and give gentle Lasix 1 on 01/22/2016, repeat BMP shows some resolution She was encouraged not to drink progressively and sodium on day prior to discharge was 130   3. Osteoarthritis, fibromyalgia, questionable underlying PMR -She was encouraged to follow-up with primary care physician had been on steroids for 2 years prior for polymyalgia rheumatica -Will need intermittent follow-up as outpatient  - supportive care.   4. GERD.  On PPI.   5. Hypertension.  Stable off medications.   6. Nonspecific left adrenal gland changes noted on CT scan.  Outpatient urology follow-up, follow cytology results from the drained fluid   7. Hypokalemia Replace with oral kdur 40 bid The patient's potassium normalized during hospital stay and it  was felt that she did not need replacement on discharge     Procedures:  Retroperitoneal drain placement performed on 01/21/2016  Consultations:  Interventional radiology  General surgery  Telephone consulted infectious  diseases this admission  Discharge Exam: Vitals:   01/25/16 0508 01/25/16 0821  BP: 134/74 (!) 162/83  Pulse: 77 84  Resp: 20 18  Temp: 98.2 F (36.8 C)     Alert pleasant oriented no apparent distress EOMI NCAT Chest is clinically clear no added sounds No murmur rub or gallop Abdomen is soft slightly bloated nontender drain in situ on the right side draining freely No lower extremity  Discharge Instructions   Discharge Instructions    Diet - low sodium heart healthy    Complete by:  As directed   Discharge instructions    Complete by:  As directed   Complete antibiotics as directed Drain care will be performed by Interventional Radiology-they will let you know how to manage this and how to flush and a home health RN will be needed to come to your home to initially teach you management of this You do NOT need any further diagnostic testing for this issue recommend that you follow with your primary MD with regards to your other chronic health issues   Increase activity slowly    Complete by:  As directed     Current Discharge Medication List    START taking these medications   Details  amoxicillin-clavulanate (AUGMENTIN) 875-125 MG tablet Take 1 tablet by mouth every 12 (twelve) hours. Qty: 56 tablet, Refills: 0      CONTINUE these medications which have NOT CHANGED   Details  acetaminophen (TYLENOL) 325 MG tablet Take 650 mg by mouth every 6 (six) hours as needed for mild pain or moderate pain.    aspirin EC 81 MG tablet Take 81 mg by mouth daily.    Calcium-Magnesium-Zinc 333-133-5 MG TABS Take 1 tablet by mouth daily.    celecoxib (CELEBREX) 200 MG capsule Take 200 mg by mouth 2 (two) times daily as needed for mild pain or moderate pain.    Cholecalciferol 2000 UNITS CAPS Take 1 capsule by mouth daily.    co-enzyme Q-10 30 MG capsule Take 30 mg by mouth 2 (two) times a week.     CYANOCOBALAMIN PO Take 1 tablet by mouth daily.    esomeprazole (NEXIUM) 20 MG  capsule Take 20 mg by mouth daily as needed (reflux).     fluorometholone (FML) 0.1 % ophthalmic suspension Place 1 drop into both eyes 2 (two) times daily. Refills: 6    KRILL OIL ULTRA STRENGTH PO Take 1 capsule by mouth daily.    metoCLOPramide (REGLAN) 5 MG tablet Take 5 mg by mouth 3 (three) times daily as needed. Refills: 0    Multiple Vitamin (MULTIVITAMIN WITH MINERALS) TABS tablet Take 1 tablet by mouth daily.    Probiotic Product (TRUNATURE DIGESTIVE PROBIOTIC) CAPS Take 1 capsule by mouth daily.    sodium chloride (OCEAN) 0.65 % SOLN nasal spray Place 1 spray into both nostrils as needed for congestion.    traMADol (ULTRAM) 50 MG tablet Take 1 tablet by mouth 2 (two) times daily.    vitamin C (ASCORBIC ACID) 500 MG tablet Take 500 mg by mouth daily.       Allergies  Allergen Reactions  . Ciprofloxacin Nausea Only  . Doxycycline Nausea Only  . Erythromycin Nausea Only  . Hepatitis A Vaccine Swelling      The results  of significant diagnostics from this hospitalization (including imaging, microbiology, ancillary and laboratory) are listed below for reference.    Significant Diagnostic Studies: X-ray Chest Pa And Lateral  Result Date: 01/20/2016 CLINICAL DATA:  Pleural effusion. EXAM: CHEST  2 VIEW COMPARISON:  01/20/2016, earlier the same day.  12/19/2015. FINDINGS: Lower lung volumes with increased atelectasis at the right lung base. Small right pleural effusion again noted. New atelectasis seen at the left lung base on this lower volume film. Low lung volumes accentuate the cardiopericardial silhouette. Bones are diffusely demineralized. IMPRESSION: Low volume film with increased bibasilar atelectasis and small right pleural effusion. Electronically Signed   By: Kennith Center M.D.   On: 01/20/2016 16:32   Dg Chest 2 View  Result Date: 01/20/2016 CLINICAL DATA:  Chest pain tonight. Multiple falls over the past week. EXAM: CHEST  2 VIEW COMPARISON:  Radiographs  12/19/2015.  CT 12/27/2015 FINDINGS: Again seen elevation of the right hemidiaphragm. Increased linear opacity in the adjacent right lung base, may be increased atelectasis or fluid in the fissure, suspect mild increase in right pleural effusion. Tiny left pleural effusion with blunting of the costophrenic angle. Unchanged heart size and aortic contour is allowing for differences in technique. Thoracic aortic atherosclerosis. Increased left basilar atelectasis. No pulmonary edema or pneumothorax. Compression deformity in the lower thoracic spine, as seen on CT. IMPRESSION: 1. Increased linear opacity adjacent the right lung base, may be increased atelectasis or fluid in the fissure. 2. Suspect increased in small right pleural effusion, development small left pleural effusion. Electronically Signed   By: Rubye Oaks M.D.   On: 01/20/2016 05:59   Ct Chest W Contrast  Result Date: 12/27/2015 CLINICAL DATA:  Small right pleural effusion questioned on chest x-ray, preop for total knee replacement EXAM: CT CHEST WITH CONTRAST TECHNIQUE: Multidetector CT imaging of the chest was performed during intravenous contrast administration. CONTRAST:  75mL ISOVUE-300 IOPAMIDOL (ISOVUE-300) INJECTION 61% COMPARISON:  Chest x-ray of 12/19/2015 FINDINGS: Cardiovascular: There is good opacification of the thoracic aorta. The mid ascending thoracic aorta measures 36 mm in diameter. The pulmonary arteries are opacified as well with no central abnormality evident. Coronary artery calcifications are present in the distribution of the left anterior descending coronary artery. No pericardial effusion is seen. Moderate thoracic aortic atherosclerosis is present. Mediastinum/Nodes: No mediastinal or hilar adenopathy is noted. The thyroid gland is unremarkable. Lungs/Pleura: On lung window images, only a tiny right pleural effusion is present. The majority the opacity at the posterior right lung base appears to be due to atelectasis  involving both the right lower lobe and the medial right middle lobe possibly as result of chronic elevation of the right hemidiaphragm. An inflammatory process cannot be excluded, but I would favor this being a chronic process. The left lung is clear other than mild linear scarring in the posterior left lower lobe. The central bronchi are patent. No suspicious lung nodule or mass is seen. Upper Abdomen: However, there is a definite abnormality within the posterior aspect of the right lobe of liver. On axial image 116 series 4 there is a rounded lesion in the posterior right lobe of liver of 5.0 x 4.8 cm with probable adjacent fluid. More caudally there is a lower attenuation portion of this lesion measuring 3.4 x 3.5 cm with attenuation of 15 HU. Immediately adjacent extending along the posterior capsule of the right posterior lobe of liver there is a collection of fluid or soft tissue measuring 17 HU extending into the right  retroperitoneum. This collection also appears to invade the adjacent fat planes and possibly the right lower posterolateral chest wall. Although these changes could be inflammatory in nature, a neoplastic process is a definite consideration. CT of the abdomen pelvis with oral and IV contrast media is recommended. Incidental gallstones also are noted within the gallbladder, the largest of 2.9 cm. No gallbladder wall thickening is seen. A few minimally prominent lymph nodes are present near the porta hepatis. Musculoskeletal: On the sagittal view there appears to be compression deformity of T11 and L2 of uncertain acuity. Also there is irregularity of the sternum most likely due to prior fracture with healing. Clinical correlation is recommended. IMPRESSION: 1. Abnormality within the posterior right lobe of liver with adjacent fluid and/or soft tissue extending more caudally. Some of this soft tissue does appear to invade the fat planes of the adjacent right posterolateral chest wall. Although  these changes could be inflammatory, a neoplastic process is a definite consideration. Recommend CT of the abdomen pelvis with oral and IV contrast media. 2. Incidental gallstones. 3. Opacity at the right lung base is most likely due to chronic atelectasis with elevation of the right hemidiaphragm also involving the medial right middle lobe. Only a tiny right pleural effusion is seen. This opacity may also be in part due to the process involving the posterior right lobe of liver as described above. 4. Partial compression deformity of T11 and possibly L2 of uncertain acuity. 5. Probable old fracture of the sternum.  Correlate clinically. 6. Moderate thoracic aortic atherosclerosis. Electronically Signed   By: Dwyane Dee M.D.   On: 12/27/2015 16:24   [place] Chest  Result Date: 01/20/2016 CLINICAL DATA:  Suspected right pleural effusion. EXAM: CHEST ULTRASOUND COMPARISON:  Chest radiograph 01/20/2016 FINDINGS: Focused right chest wall ultrasound demonstrates small right subpulmonic effusion with maximum thickness of 12 mm, not amenable to thoracocentesis. IMPRESSION: Small right pleural effusion. Electronically Signed   By: Ted Mcalpine M.D.   On: 01/20/2016 11:36   Ct Abdomen Pelvis W Contrast  Result Date: 01/20/2016 CLINICAL DATA:  Liver abscess. EXAM: CT ABDOMEN AND PELVIS WITH CONTRAST TECHNIQUE: Multidetector CT imaging of the abdomen and pelvis was performed using the standard protocol following bolus administration of intravenous contrast. CONTRAST:  100 mL Isovue 300 COMPARISON:  Chest CT 12/27/2015 FINDINGS: Lower chest: Band of atelectasis in the LEFT and RIGHT lower lobe. Small bilateral pleural effusions. Hepatobiliary: The inferior aspect of the RIGHT hepatic lobe is a 3.1 by 2.4 cm hypodense lesion with ill-defined margins which is decreased from 3.4 by 3.4 cm on prior. However, there is a complex fluid collection adjacent to RIGHT hepatic lobe which is increased in size compared to prior  measuring 10.2 by 3.2 cm (image 24, series 201) increased from 5.7 by 2.2 cm. This perihepatic fluid collection has internal septations and gas locules. Fluid collections extend along the RIGHT retroperitoneal space into the iliac fossa. Collection measures 5.9 by 4.8 cm (image 46, series 201) at the level of the iliac crest. Fluid collection extends anterior to the iliacus muscle the level of the RIGHT external iliac artery measuring 4.4 by 3.2 cm this level (image 64, series 201). There is thickening along the lateral aspect of the the para renal fascia (image 30, series 201. The remaining of the liver parenchyma is normal. There multiple large gallstones within the lumen gallbladder ranging in size from 12 to 20 mm. The gallbladder distension. Common bile duct normal. Pancreas: Normal Spleen: Normal spleen Adrenals/urinary  tract: There is ill-defined tissue associated the inferior aspect of the LEFT adrenal gland (image 26, series 201. RIGHT adrenal glands normal. The kidneys enhance symmetrically. Ureters appear normal. Bladder normal Stomach/Bowel: The stomach, small bowel normal. There is some thickening along the terminal ileum. There is thickening along the ascending colon just above the cecum. This is adjacent to the retroperitoneal fluid collection / infection may be secondary inflammation. Vascular/Lymphatic:  Clear calcification Reproductive: Uterus and ovaries normal. Other: No free fluid. Musculoskeletal: No aggressive osseous lesion. IMPRESSION: 1. Retroperitoneal fluid collection extends from the RIGHT iliac fossa to the RIGHT hepatic lobe margin consistent with large RETROPERITONEAL ABSCESS. There septation and gas collection in the most superior aspect adjacent to the liver with mass-effect on liver. Recommend percutaneous drainage and cytology of the fluid collection RIGHT lateral to the liver. 2. Presumed hepatic abscess in the most inferior RIGHT hepatic lobe is slightly smaller. 3. Ill-defined  tissue associated with the LEFT adrenal gland is indeterminate. Cannot exclude malignancy. Recommend correlation with cytology as above. 4. Thicken along the perirenal fascia laterally adjacent to the RIGHT kidney. Differential includes infection or malignancy. 5. Thickening of the terminal ileum and ascending colon may be secondary to the retroperitoneal infection. 6. Cholelithiasis. These results will be called to the ordering clinician or representative by the Radiologist Assistant, and communication documented in the PACS or zVision Dashboard. Electronically Signed   By: Genevive Bi M.D.   On: 01/20/2016 16:29   Ct Image Guided Drainage By Percutaneous Catheter  Result Date: 01/21/2016 CLINICAL DATA:  Right-sided retroperitoneal abscess extending superiorly to abut the liver. The patient presents for percutaneous drainage. EXAM: CT GUIDED DRAINAGE OF RETROPERITONEAL ABSCESS ANESTHESIA/SEDATION: 1.0 Mg IV Versed 50 mcg IV Fentanyl Total Moderate Sedation Time:  26 minutes The patient's level of consciousness and physiologic status were continuously monitored during the procedure by Radiology nursing. PROCEDURE: The procedure, risks, benefits, and alternatives were explained to the patient. Questions regarding the procedure were encouraged and answered. The patient understands and consents to the procedure. A time-out was performed prior to initiating the procedure. The right abdominal wall was prepped with chlorhexidine in a sterile fashion, and a sterile drape was applied covering the operative field. A sterile gown and sterile gloves were used for the procedure. Local anesthesia was provided with 1% Lidocaine. Imaging through the abdomen was performed in a supine position. Under CT guidance, an 18 gauge needle was advanced into a perihepatic abscess. A guidewire was advanced through the needle. The percutaneous tract was dilated and a 12 French pigtail drainage catheter placed. Fluid aspiration was  performed with a sample sent for culture analysis. The catheter was flushed and connected to a suction bulb. The catheter was secured at skin with a Prolene retention suture and adhesive StatLock device. COMPLICATIONS: None FINDINGS: The lower perihepatic component of an elongated and predominantly retroperitoneal abscess yielded purulent fluid. After drainage catheter placement, there is good return of purulent fluid. IMPRESSION: CT-guided percutaneous drainage of abscess adjacent to the inferior liver. Grossly purulent fluid return noted with a sample sent for culture analysis. A 12 French drain was placed and attached to suction bulb drainage. Electronically Signed   By: Irish Lack M.D.   On: 01/21/2016 16:26    Microbiology: Recent Results (from the past 240 hour(s))  Urine culture     Status: None   Collection Time: 01/20/16  5:00 AM  Result Value Ref Range Status   Specimen Description URINE, CATHETERIZED  Final   Special  Requests NONE  Final   Culture NO GROWTH  Final   Report Status 01/21/2016 FINAL  Final  Culture, blood (Routine X 2) w Reflex to ID Panel     Status: None (Preliminary result)   Collection Time: 01/20/16  2:15 PM  Result Value Ref Range Status   Specimen Description BLOOD LEFT ANTECUBITAL  Final   Special Requests BOTTLES DRAWN AEROBIC AND ANAEROBIC 10CC  Final   Culture NO GROWTH 4 DAYS  Final   Report Status PENDING  Incomplete  Culture, blood (Routine X 2) w Reflex to ID Panel     Status: None (Preliminary result)   Collection Time: 01/20/16  2:20 PM  Result Value Ref Range Status   Specimen Description BLOOD BLOOD LEFT HAND  Final   Special Requests BOTTLES DRAWN AEROBIC AND ANAEROBIC 5CC  Final   Culture NO GROWTH 4 DAYS  Final   Report Status PENDING  Incomplete  Aerobic/Anaerobic Culture (surgical/deep wound)     Status: None (Preliminary result)   Collection Time: 01/21/16  5:15 PM  Result Value Ref Range Status   Specimen Description ABSCESS  Final    Special Requests RIGHT UPPER QUADRANT  Final   Gram Stain   Final    ABUNDANT WBC PRESENT, PREDOMINANTLY PMN ABUNDANT GRAM POSITIVE COCCI IN PAIRS IN CHAINS FEW GRAM NEGATIVE RODS    Culture   Final    ABUNDANT STREPTOCOCCUS CONSTELLATUS MODERATE BACTEROIDES FRAGILIS BETA LACTAMASE POSITIVE NO ANAEROBES ISOLATED; CULTURE IN PROGRESS FOR 5 DAYS    Report Status PENDING  Incomplete     Labs: Basic Metabolic Panel:  Recent Labs Lab 01/20/16 1627 01/21/16 0126 01/22/16 0951 01/23/16 0412 01/24/16 0349  NA 120* 123* 124* 128* 130*  K 3.3* 3.1* 3.5 2.9* 4.2  CL 85* 84* 89* 90* 93*  CO2 22 28 27 29 31   GLUCOSE 144* 137* 126* 95 107*  BUN 6 <5* 8 7 <5*  CREATININE 0.76 0.79 0.88 0.85 0.80  CALCIUM 7.7* 7.8* 7.6* 7.9* 8.1*  MG  --   --  1.2* 1.5*  --    Liver Function Tests:  Recent Labs Lab 01/20/16 0439 01/21/16 0126 01/24/16 0349  AST 27 23 27   ALT 19 18 22   ALKPHOS 185* 152* 137*  BILITOT 0.9 0.8 0.6  PROT 6.0* 5.1* 4.8*  ALBUMIN 2.2* 1.9* 1.9*   No results for input(s): LIPASE, AMYLASE in the last 168 hours. No results for input(s): AMMONIA in the last 168 hours. CBC:  Recent Labs Lab 01/20/16 0613 01/21/16 0126 01/22/16 0951 01/24/16 0349  WBC 27.0* 26.2* 21.0* 10.9*  NEUTROABS 25.4*  --   --  9.3*  HGB 9.5* 9.4* 9.6* 9.5*  HCT 28.7* 27.8* 29.3* 29.3*  MCV 79.7 78.8 80.3 80.5  PLT 533* 486* 477* 519*   Cardiac Enzymes: No results for input(s): CKTOTAL, CKMB, CKMBINDEX, TROPONINI in the last 168 hours. BNP: BNP (last 3 results) No results for input(s): BNP in the last 8760 hours.  ProBNP (last 3 results) No results for input(s): PROBNP in the last 8760 hours.  CBG: No results for input(s): GLUCAP in the last 168 hours.     SignedRhetta Mura:  Elodie Panameno, JAI-GURMUKH MD.  [facility] 01/25/2016, 10:53 AM

## 2016-01-25 NOTE — Progress Notes (Signed)
Gypsy Lorearol A Peed to be D/C'd Home per MD order.  Discussed prescriptions and follow up appointments with the patient. Prescriptions given to patient, medication list explained in detail. Pt verbalized understanding.    Medication List    TAKE these medications   acetaminophen 325 MG tablet Commonly known as:  TYLENOL Take 650 mg by mouth every 6 (six) hours as needed for mild pain or moderate pain.   amoxicillin-clavulanate 875-125 MG tablet Commonly known as:  AUGMENTIN Take 1 tablet by mouth every 12 (twelve) hours.   aspirin EC 81 MG tablet Take 81 mg by mouth daily.   Calcium-Magnesium-Zinc 333-133-5 MG Tabs Take 1 tablet by mouth daily.   celecoxib 200 MG capsule Commonly known as:  CELEBREX Take 200 mg by mouth 2 (two) times daily as needed for mild pain or moderate pain.   Cholecalciferol 2000 units Caps Take 1 capsule by mouth daily.   co-enzyme Q-10 30 MG capsule Take 30 mg by mouth 2 (two) times a week.   CYANOCOBALAMIN PO Take 1 tablet by mouth daily.   esomeprazole 20 MG capsule Commonly known as:  NEXIUM Take 20 mg by mouth daily as needed (reflux).   fluorometholone 0.1 % ophthalmic suspension Commonly known as:  FML Place 1 drop into both eyes 2 (two) times daily.   KRILL OIL ULTRA STRENGTH PO Take 1 capsule by mouth daily.   metoCLOPramide 5 MG tablet Commonly known as:  REGLAN Take 5 mg by mouth 3 (three) times daily as needed.   multivitamin with minerals Tabs tablet Take 1 tablet by mouth daily.   sodium chloride 0.65 % Soln nasal spray Commonly known as:  OCEAN Place 1 spray into both nostrils as needed for congestion.   traMADol 50 MG tablet Commonly known as:  ULTRAM Take 1 tablet by mouth 2 (two) times daily.   TRUNATURE DIGESTIVE PROBIOTIC Caps Take 1 capsule by mouth daily.   vitamin C 500 MG tablet Commonly known as:  ASCORBIC ACID Take 500 mg by mouth daily.       Vitals:   01/25/16 0508 01/25/16 0821  BP: 134/74 (!)  162/83  Pulse: 77 84  Resp: 20 18  Temp: 98.2 F (36.8 C)     Skin clean, dry and intact without evidence of skin break down, no evidence of skin tears noted. IV catheter discontinued intact. Site without signs and symptoms of complications. Dressing and pressure applied. Pt denies pain at this time. No complaints noted.  An After Visit Summary was printed and given to the patient. Patient escorted via WC, and D/C home via private auto.  Mariann BarterKellie Cuyler Vandyken BSN, RN [facility] Phone [phone]

## 2016-01-25 NOTE — Progress Notes (Signed)
Occupational Therapy Treatment Patient Details Name: Shannon Montgomery MRN: [id number] DOB: 03-Oct-1934 Today's Date: 01/25/2016    History of present illness Pt admitted with hyponatremia after falling at home. Pt with poor appetite and dehydration. CT guided drainage of right retroperitoneal/perihepatic abscess 01/21/16, has drain. PMH: recent dx of liver lesions and R lung opacity as she was preparing for a R TKA, fibromyalgia, polymyalgia rheumatica.   OT comments  Pt needs walker prior to DC. RN aware  Follow Up Recommendations  No OT follow up    Equipment Recommendations   (Rolling walker)       Precautions / Restrictions Precautions Precautions: Fall Precaution Comments: R hepatic drain Restrictions Weight Bearing Restrictions: No       Mobility Bed Mobility               General bed mobility comments: pt in chair  Transfers Overall transfer level: Needs assistance Equipment used: Rolling walker (2 wheeled) Transfers: Sit to/from UGI CorporationStand;Stand Pivot Transfers Sit to Stand: Min guard Stand pivot transfers: Min guard                ADL                           Toilet Transfer: Min guard;Ambulation;BSC;RW   Toileting- ArchitectClothing Manipulation and Hygiene: Min guard;Sit to/from stand       Functional mobility during ADLs: Min guard General ADL Comments: husband will A as needed;  Spoke to nurse regarding need for walker prior to DC      Vision                            Cognition   Behavior During Therapy[facility] for tasks assessed/performed Overall Cognitive Status: Within Functional Limits for tasks assessed                               General Comments      Pertinent Vitals/ Pain       Pain Assessment: Faces Pain Location: r ankle Pain Descriptors / Indicators: Sore Pain Intervention(s): Monitored during session         Frequency Min 2X/week     Progress Toward Goals  OT Goals(current goals can now be  found in the care plan section)  Progress towards OT goals: Progressing toward goals     Plan         End of Session Equipment Utilized During Treatment: Rolling walker;Gait belt   Activity Tolerance Patient limited by fatigue   Patient Left in bed;with call bell/phone within reach   Nurse Communication          Time: [id number] OT Time Calculation (min): 14 min  Charges: OT General Charges $OT Visit: 1 Procedure OT Treatments $Self Care/Home Management : 8-22 mins  Harneet Noblett D 01/25/2016, 12:04 PM

## 2016-01-26 LAB — AEROBIC/ANAEROBIC CULTURE W GRAM STAIN (SURGICAL/DEEP WOUND)

## 2016-01-26 LAB — AEROBIC/ANAEROBIC CULTURE (SURGICAL/DEEP WOUND)

## 2016-01-29 ENCOUNTER — Other Ambulatory Visit: Payer: Self-pay | Admitting: General Surgery

## 2016-01-29 DIAGNOSIS — K650 Generalized (acute) peritonitis: Secondary | ICD-10-CM

## 2016-02-07 ENCOUNTER — Other Ambulatory Visit: Payer: Self-pay | Admitting: General Surgery

## 2016-02-07 ENCOUNTER — Other Ambulatory Visit: Payer: Self-pay | Admitting: Radiology

## 2016-02-07 ENCOUNTER — Ambulatory Visit
Admission: RE | Admit: 2016-02-07 | Discharge: 2016-02-07 | Disposition: A | Discharge status: Home / Self Care | Payer: Medicare Other | Source: Ambulatory Visit | Attending: General Surgery | Admitting: General Surgery

## 2016-02-07 ENCOUNTER — Other Ambulatory Visit: Payer: Medicare Other

## 2016-02-07 ENCOUNTER — Ambulatory Visit
Admission: RE | Admit: 2016-02-07 | Discharge: 2016-02-07 | Disposition: A | Discharge status: Home / Self Care | Payer: Medicare Other | Source: Ambulatory Visit | Attending: Radiology | Admitting: Radiology

## 2016-02-07 DIAGNOSIS — K650 Generalized (acute) peritonitis: Secondary | ICD-10-CM

## 2016-02-07 HISTORY — PX: IR GENERIC HISTORICAL: IMG1180011

## 2016-02-07 MED ORDER — IOPAMIDOL (ISOVUE-300) INJECTION 61%
100.0000 mL | Freq: Once | INTRAVENOUS | Status: AC | PRN
  Administered 2016-02-07: 100 mL via INTRAVENOUS

## 2016-02-07 NOTE — Progress Notes (Signed)
Referring Physician(s): Kim,J  Chief Complaint: The patient is seen in follow up today s/p CT guided drainage of abscess adjacent to the inferior liver on 01/21/16.  History of present illness: Shannon Montgomery is a 81 year old female with history of fibromyalgia, PMR and chronic hyponatremia who was admitted to [facility] on 01/20/16 following a fall at home and several week history of weakness, decreased appetite and intermittent nausea. Subsequent imaging revealed retroperitoneal fluid collection extending from the right iliac fossa to the right hepatic lobe. On 01/21/16 she underwent CT-guided drainage of abscess adjacent to the inferior liver. Fluid cultures revealed Streptococcus constellatus as well as Bacteroides.She was treated with intravenous antibiotics and discharged home on Augmentin. Cytology from drain fluid was negative for malignancy. She presents today for routine outpatient follow-up CT and drain assessment. Since her discharge home she has been doing fairly well. She continues to have some occasional nausea and some mild tenderness at the right abdominal drain insertion site. She denies fever, vomiting or abnormal bleeding. She is eating well and having normal bowel movements. She does have continued fatigue. The right abdominal drain is being flushed once daily. Drain output over the last 2-3 days has averaged between 15-60 mL milky appearing fluid.   Past Medical History:  Diagnosis Date  . Arthritis   . Cataract   . Closed right ankle fracture   . Fibromyalgia   . GERD (gastroesophageal reflux disease)   . Osteoarthritis   . PMR (polymyalgia rheumatica) (HCC)   . Wears glasses     Past Surgical History:  Procedure Laterality Date  . cornea transplants    . CYSTECTOMY     right ankle  . EYE SURGERY     x3  . I&D EXTREMITY Right 06/08/2013   Procedure: IRRIGATION AND DEBRIDEMENT EXTREMITY/RIGHT ANKLE;  Surgeon: Mable Paris, MD;  Location: [facility] OR;   Service: Orthopedics;  Laterality: Right;  . ORIF ANKLE FRACTURE Right 06/08/2013   Procedure: OPEN REDUCTION INTERNAL FIXATION (ORIF) ANKLE FRACTURE;  Surgeon: Mable Paris, MD;  Location: [facility] OR;  Service: Orthopedics;  Laterality: Right;    Allergies: Ciprofloxacin; Doxycycline; Erythromycin; and Hepatitis a vaccine  Medications: Prior to Admission medications   Medication Sig Start Date End Date Taking? Authorizing Provider  acetaminophen (TYLENOL) 325 MG tablet Take 650 mg by mouth every 6 (six) hours as needed for mild pain or moderate pain.    Historical Provider, MD  amoxicillin-clavulanate (AUGMENTIN) 875-125 MG tablet Take 1 tablet by mouth every 12 (twelve) hours. 01/25/16   Rhetta Mura, MD  aspirin EC 81 MG tablet Take 81 mg by mouth daily.    Historical Provider, MD  Calcium-Magnesium-Zinc [phone] MG TABS Take 1 tablet by mouth daily.    Historical Provider, MD  celecoxib (CELEBREX) 200 MG capsule Take 200 mg by mouth 2 (two) times daily as needed for mild pain or moderate pain.    Historical Provider, MD  Cholecalciferol 2000 UNITS CAPS Take 1 capsule by mouth daily.    Historical Provider, MD  co-enzyme Q-10 30 MG capsule Take 30 mg by mouth 2 (two) times a week.     Historical Provider, MD  CYANOCOBALAMIN PO Take 1 tablet by mouth daily.    Historical Provider, MD  esomeprazole (NEXIUM) 20 MG capsule Take 20 mg by mouth daily as needed (reflux).     Historical Provider, MD  fluorometholone (FML) 0.1 % ophthalmic suspension Place 1 drop into both eyes 2 (two) times daily. 11/05/15  Historical Provider, MD  KRILL OIL ULTRA STRENGTH PO Take 1 capsule by mouth daily.    Historical Provider, MD  metoCLOPramide (REGLAN) 5 MG tablet Take 5 mg by mouth 3 (three) times daily as needed. 11/06/15   Historical Provider, MD  Multiple Vitamin (MULTIVITAMIN WITH MINERALS) TABS tablet Take 1 tablet by mouth daily.    Historical Provider, MD  Probiotic Product (TRUNATURE  DIGESTIVE PROBIOTIC) CAPS Take 1 capsule by mouth daily.    Historical Provider, MD  sodium chloride (OCEAN) 0.65 % SOLN nasal spray Place 1 spray into both nostrils as needed for congestion.    Historical Provider, MD  traMADol (ULTRAM) 50 MG tablet Take 1 tablet by mouth 2 (two) times daily. 12/26/15   Historical Provider, MD  vitamin C (ASCORBIC ACID) 500 MG tablet Take 500 mg by mouth daily.    Historical Provider, MD     Family History  Problem Relation Age of Onset  . Breast cancer Mother   . Anuerysm Father   . Diabetes Brother   . Brain cancer Brother   . Alcoholism Brother     Social History   Social History  . Marital status: Married    Spouse name: N/A  . Number of children: N/A  . Years of education: N/A   Social History Main Topics  . Smoking status: Never Smoker  . Smokeless tobacco: Never Used  . Alcohol use No  . Drug use: No  . Sexual activity: No   Other Topics Concern  . Not on file   Social History Narrative  . No narrative on file     Vital Signs: BP 137/74 (BP Location: Left Arm)   Pulse 88   Temp 97.4 F (36.3 C) (Oral)   SpO2 99%   Physical Exam patient awake, alert. Chest clear to auscultation bilaterally. Heart with regular rate and rhythm. Abdomen soft, positive bowel sounds, clean, intact right lateral abdominal drain with minimal tenderness to palpation; lower extremities with no significant edema.  Imaging: No results found.  Labs:  CBC:  Recent Labs  01/20/16 0613 01/21/16 0126 01/22/16 0951 01/24/16 0349  WBC 27.0* 26.2* 21.0* 10.9*  HGB 9.5* 9.4* 9.6* 9.5*  HCT 28.7* 27.8* 29.3* 29.3*  PLT 533* 486* 477* 519*    COAGS:  Recent Labs  12/19/15 1154 01/20/16 1627  INR 1.03 1.28  APTT 34 36    BMP:  Recent Labs  01/21/16 0126 01/22/16 0951 01/23/16 0412 01/24/16 0349  NA 123* 124* 128* 130*  K 3.1* 3.5 2.9* 4.2  CL 84* 89* 90* 93*  CO2 28 27 29 31   GLUCOSE 137* 126* 95 107*  BUN <5* 8 7 <5*  CALCIUM  7.8* 7.6* 7.9* 8.1*  CREATININE 0.79 0.88 0.85 0.80  GFRNONAA >60 >60 >60 >60  GFRAA >60 >60 >60 >60    LIVER FUNCTION TESTS:  Recent Labs  01/20/16 0439 01/21/16 0126 01/24/16 0349  BILITOT 0.9 0.8 0.6  AST 27 23 27   ALT 19 18 22   ALKPHOS 185* 152* 137*  PROT 6.0* 5.1* 4.8*  ALBUMIN 2.2* 1.9* 1.9*    Assessment: Patient with history of right retroperitoneal abscess, status post CT-guided drainage on 01/21/16. Currently afebrile and on Augmentin therapy. Follow-up CT of abdomen and pelvis today reveals decrease in size of drained right lateral abdominal abscess but persistent abscess noted in right lower quadrant. She will be scheduled for CT-guided drainage of this additional abscess on 02/08/16 at [facility]. Preprocedure instructions given to  patient and husband.   Signed: D. Jeananne RamaKevin Allred 02/07/2016, 2:21 PM   Please refer to Dr. Kenna GilbertWagner's attestation of this note for management and plan.

## 2016-02-08 ENCOUNTER — Other Ambulatory Visit: Payer: Self-pay | Admitting: General Surgery

## 2016-02-08 ENCOUNTER — Other Ambulatory Visit (HOSPITAL_COMMUNITY): Payer: Medicare Other

## 2016-02-08 ENCOUNTER — Encounter (HOSPITAL_COMMUNITY): Payer: Self-pay

## 2016-02-08 ENCOUNTER — Ambulatory Visit (HOSPITAL_COMMUNITY)
Admission: RE | Admit: 2016-02-08 | Discharge: 2016-02-08 | Disposition: A | Discharge status: Home / Self Care | Payer: Medicare Other | Source: Ambulatory Visit | Attending: General Surgery | Admitting: General Surgery

## 2016-02-08 DIAGNOSIS — Z887 Allergy status to serum and vaccine status: Secondary | ICD-10-CM | POA: Diagnosis not present

## 2016-02-08 DIAGNOSIS — Z803 Family history of malignant neoplasm of breast: Secondary | ICD-10-CM | POA: Diagnosis not present

## 2016-02-08 DIAGNOSIS — M797 Fibromyalgia: Secondary | ICD-10-CM | POA: Insufficient documentation

## 2016-02-08 DIAGNOSIS — K219 Gastro-esophageal reflux disease without esophagitis: Secondary | ICD-10-CM | POA: Insufficient documentation

## 2016-02-08 DIAGNOSIS — M353 Polymyalgia rheumatica: Secondary | ICD-10-CM | POA: Diagnosis not present

## 2016-02-08 DIAGNOSIS — M1990 Unspecified osteoarthritis, unspecified site: Secondary | ICD-10-CM | POA: Insufficient documentation

## 2016-02-08 DIAGNOSIS — Z811 Family history of alcohol abuse and dependence: Secondary | ICD-10-CM | POA: Insufficient documentation

## 2016-02-08 DIAGNOSIS — Z833 Family history of diabetes mellitus: Secondary | ICD-10-CM | POA: Diagnosis not present

## 2016-02-08 DIAGNOSIS — Z881 Allergy status to other antibiotic agents status: Secondary | ICD-10-CM | POA: Diagnosis not present

## 2016-02-08 DIAGNOSIS — Z809 Family history of malignant neoplasm, unspecified: Secondary | ICD-10-CM | POA: Diagnosis not present

## 2016-02-08 DIAGNOSIS — Z9889 Other specified postprocedural states: Secondary | ICD-10-CM | POA: Insufficient documentation

## 2016-02-08 DIAGNOSIS — K650 Generalized (acute) peritonitis: Secondary | ICD-10-CM | POA: Insufficient documentation

## 2016-02-08 LAB — CBC WITH DIFFERENTIAL/PLATELET
Basophils Absolute: 0 10*3/uL (ref 0.0–0.1)
Basophils Relative: 0 %
EOS ABS: 0.1 10*3/uL (ref 0.0–0.7)
EOS PCT: 1 %
HCT: 34.8 % — ABNORMAL LOW (ref 36.0–46.0)
HEMOGLOBIN: 11.1 g/dL — AB (ref 12.0–15.0)
LYMPHS ABS: 0.7 10*3/uL (ref 0.7–4.0)
Lymphocytes Relative: 8 %
MCH: 26.1 pg (ref 26.0–34.0)
MCHC: 31.9 g/dL (ref 30.0–36.0)
MCV: 81.7 fL (ref 78.0–100.0)
MONO ABS: 0.6 10*3/uL (ref 0.1–1.0)
MONOS PCT: 7 %
Neutro Abs: 7.8 10*3/uL — ABNORMAL HIGH (ref 1.7–7.7)
Neutrophils Relative %: 84 %
Platelets: 376 10*3/uL (ref 150–400)
RBC: 4.26 MIL/uL (ref 3.87–5.11)
RDW: 15 % (ref 11.5–15.5)
WBC: 9.2 10*3/uL (ref 4.0–10.5)

## 2016-02-08 LAB — COMPREHENSIVE METABOLIC PANEL
ALK PHOS: 86 U/L (ref 38–126)
ALT: 12 U/L — AB (ref 14–54)
AST: 20 U/L (ref 15–41)
Albumin: 2.5 g/dL — ABNORMAL LOW (ref 3.5–5.0)
Anion gap: 10 (ref 5–15)
BUN: 6 mg/dL (ref 6–20)
CALCIUM: 9.2 mg/dL (ref 8.9–10.3)
CO2: 27 mmol/L (ref 22–32)
CREATININE: 0.79 mg/dL (ref 0.44–1.00)
Chloride: 93 mmol/L — ABNORMAL LOW (ref 101–111)
GFR calc non Af Amer: >60 mL/min (ref >60)
GLUCOSE: 115 mg/dL — AB (ref 65–99)
Potassium: 3.5 mmol/L (ref 3.5–5.1)
SODIUM: 130 mmol/L — AB (ref 135–145)
Total Bilirubin: 1 mg/dL (ref 0.3–1.2)
Total Protein: 6 g/dL — ABNORMAL LOW (ref 6.5–8.1)

## 2016-02-08 LAB — PROTIME-INR
INR: 1.11
Prothrombin Time: 14.4 seconds (ref 11.4–15.2)

## 2016-02-08 MED ORDER — CEFAZOLIN SODIUM-DEXTROSE 2-4 GM/100ML-% IV SOLN
INTRAVENOUS | Status: AC
  Administered 2016-02-08: 2 g via INTRAVENOUS
  Filled 2016-02-08: qty 100

## 2016-02-08 MED ORDER — CEFAZOLIN SODIUM-DEXTROSE 2-4 GM/100ML-% IV SOLN
2.0000 g | Freq: Once | INTRAVENOUS | Status: AC
  Administered 2016-02-08: 2 g via INTRAVENOUS
  Filled 2016-02-08: qty 100

## 2016-02-08 MED ORDER — FENTANYL CITRATE (PF) 100 MCG/2ML IJ SOLN
INTRAMUSCULAR | Status: AC | PRN
  Administered 2016-02-08: 25 ug via INTRAVENOUS
  Administered 2016-02-08: 12.5 ug via INTRAVENOUS

## 2016-02-08 MED ORDER — MIDAZOLAM HCL 2 MG/2ML IJ SOLN
INTRAMUSCULAR | Status: AC | PRN
  Administered 2016-02-08 (×2): 0.5 mg via INTRAVENOUS

## 2016-02-08 MED ORDER — SODIUM CHLORIDE 0.9 % IV SOLN
INTRAVENOUS | Status: AC | PRN
  Administered 2016-02-08: 50 mL/h via INTRAVENOUS

## 2016-02-08 MED ORDER — FENTANYL CITRATE (PF) 100 MCG/2ML IJ SOLN
INTRAMUSCULAR | Status: AC
  Filled 2016-02-08: qty 2

## 2016-02-08 MED ORDER — LIDOCAINE HCL 1 % IJ SOLN
INTRAMUSCULAR | Status: AC
  Filled 2016-02-08: qty 20

## 2016-02-08 MED ORDER — MIDAZOLAM HCL 2 MG/2ML IJ SOLN
INTRAMUSCULAR | Status: AC
  Filled 2016-02-08: qty 2

## 2016-02-08 MED ORDER — SODIUM CHLORIDE 0.9 % IV SOLN
INTRAVENOUS | Status: DC
  Administered 2016-02-08 (×2): via INTRAVENOUS

## 2016-02-08 NOTE — Discharge Instructions (Signed)
Bulb Drain Home Care A bulb drain consists of a thin rubber tube and a soft, round bulb that creates a gentle suction. The rubber tube is placed in the area where you had surgery. A bulb is attached to the end of the tube that is outside the body. The bulb drain removes excess fluid that normally builds up in a surgical wound after surgery. The color and amount of fluid will vary. Immediately after surgery, the fluid is bright red and is a little thicker than water. It may gradually change to a yellow or pink color and become more thin and water-like. When the amount decreases to about 1 or 2 tbsp in 24 hours, your health care provider will usually remove it. DAILY CARE  Keep the bulb flat (compressed) at all times, except while emptying it. The flatness creates suction. You can flatten the bulb by squeezing it firmly in the middle and then closing the cap.  Keep sites where the tube enters the skin dry and covered with a bandage (dressing).  Secure the tube 1-2 in (2.5-5.1 cm) below the insertion sites to keep it from pulling on your stitches. The tube is stitched in place and will not slip out.  Secure the bulb as directed by your health care provider.  For the first 3 days after surgery, there usually is more fluid in the bulb. Empty the bulb whenever it becomes half full because the bulb does not create enough suction if it is too full. The bulb could also overflow. Write down how much fluid you remove each time you empty your drain. Add up the amount removed in 24 hours.  Empty the bulb at the same time every day once the amount of fluid decreases and you only need to empty it once a day. Write down the amounts and the 24-hour totals to give to your health care provider. This helps your health care provider know when the tubes can be removed. EMPTYING THE BULB DRAIN Before emptying the bulb, get a measuring cup, a piece of paper and a pen, and wash your hands.  Gently run your fingers down the  tube (stripping) to empty any drainage from the tubing into the bulb. This may need to be done several times a day to clear the tubing of clots and tissue.  Open the bulb cap to release suction, which causes it to inflate. Do not touch the inside of the cap.  Gently run your fingers down the tube (stripping) to empty any drainage from the tubing into the bulb.  Hold the cap out of the way, and pour fluid into the measuring cup.   Squeeze the bulb to provide suction.  Replace the cap.   Check the tape that holds the tube to your skin. If it is becoming loose, you can remove the loose piece of tape and apply a new one. Then, pin the bulb to your shirt.   Write down the amount of fluid you emptied out. Write down the date and each time you emptied your bulb drain. (If there are 2 bulbs, note the amount of drainage from each bulb and keep the totals separate. Your health care provider will want to know the total amounts for each drain and which tube is draining more.)   Flush the fluid down the toilet and wash your hands.   Call your health care provider once you have less than 2 tbsp of fluid collecting in the bulb drain every 24 hours. If  there is drainage around the tube site, change dressings and keep the area dry. Cleanse around tube with sterile saline and place dry gauze around site. This gauze should be changed when it is soiled. If it stays clean and unsoiled, it should still be changed daily.  SEEK MEDICAL CARE IF:  Your drainage has a bad smell or is cloudy.   You have a fever.   Your drainage is increasing instead of decreasing.   Your tube fell out.   You have redness or swelling around the tube site.   You have drainage from a surgical wound.   Your bulb drain will not stay flat after you empty it.  MAKE SURE YOU:   Understand these instructions.  Will watch your condition.  Will get help right away if you are not doing well or get worse.   This  information is not intended to replace advice given to you by your health care provider. Make sure you discuss any questions you have with your health care provider.   Document Released: 05/09/2000 Document Revised: 06/02/2014 Document Reviewed: 11/29/2014 Elsevier Interactive Patient Education 2016 [employer] Biliary Drainage Catheter Placement, Care After Refer to this sheet in the next few weeks. These instructions provide you with information on caring for yourself after your procedure. Your health care provider may also give you more specific instructions. Your treatment has been planned according to current medical practices, but problems sometimes occur. Call your health care provider if you have any problems or questions after your procedure. WHAT TO EXPECT AFTER THE PROCEDURE After your procedure, it is typical to have the following:  Pain or soreness at the catheter insertion site.  Drowsiness for several hours after the procedure.  Some bruising at the catheter insertion site.  Drainage into the collection bag on the outside of your body, if you have an external drainage catheter. You might see bloody discharge in the bag for the first day or two. This should turn a yellow-green color soon afterward. HOME CARE INSTRUCTIONS   Do not use machinery, drive, or make legal decisions for 24 hours after your procedure.  Have someone drive you home.  Resume your usual diet. Avoid alcoholic beverages for 24 hours after your procedure.  Rest for the remainder of the day.  Only take over-the-counter or prescription medicines for pain, discomfort, or fever as directed by your health care provider. Do not take aspirin or blood thinners unless directed otherwise. This can make bleeding worse.  Clean the tube insertion site as directed by your health care provider.  Take showers, not baths. Avoid pools and hot tubs. Before showering, cover the area with plastic wrap and tape the edges of  the plastic wrap to your skin. This is done to keep your skin dry.  Keep the skin around the insertion site dry. If the area gets wet, dry the skin completely.  Keep all follow-up appointments. SEEK MEDICAL CARE IF:   Your pain gets worse and is not relieved with pain medicines after an initial improvement.  You have any questions about your tube.  Your skin breaks down around the tube.  You have a fever.  You have chills. SEEK IMMEDIATE MEDICAL CARE IF:   Your redness, soreness, or swelling at the tube insertion site gets worse despite good cleaning.  You have leakage of bile around the tube.  Your tube becomes blocked or clogged.  Your catheter is dislodged or comes out.   This information is not intended  to replace advice given to you by your health care provider. Make sure you discuss any questions you have with your health care provider.   Document Released: 12/25/2003 Document Revised: 05/17/2013 Document Reviewed: 01/17/2013 Elsevier Interactive Patient Education [employer]

## 2016-02-08 NOTE — H&P (Signed)
Chief Complaint: RLQ abscess  Referring Physician: Dr. Ralene Ok  Supervising Physician: Aletta Edouard  Patient Status: Out-pt  HPI: Shannon Montgomery is an 81 y.o. female who is known to our service and was just seen yesterday.  Please refer to that note for all the details of her recent history.  She does have a current drain in place in her abdomen.  Her repeat CT scan yesterday shows persistent fluid collection here as well as a persisting right RTP fluid collection as well.  A new drain was recommended for this collection.  She denies any pain.  She is here today for this procedure.  Past Medical History:  Past Medical History:  Diagnosis Date  . Arthritis   . Cataract   . Closed right ankle fracture   . Fibromyalgia   . GERD (gastroesophageal reflux disease)   . Osteoarthritis   . PMR (polymyalgia rheumatica) (HCC)   . Wears glasses     Past Surgical History:  Past Surgical History:  Procedure Laterality Date  . cornea transplants    . CYSTECTOMY     right ankle  . EYE SURGERY     x3  . I&D EXTREMITY Right 06/08/2013   Procedure: IRRIGATION AND DEBRIDEMENT EXTREMITY/RIGHT ANKLE;  Surgeon: Nita Sells, MD;  Location: [place];  Service: Orthopedics;  Laterality: Right;  . ORIF ANKLE FRACTURE Right 06/08/2013   Procedure: OPEN REDUCTION INTERNAL FIXATION (ORIF) ANKLE FRACTURE;  Surgeon: Nita Sells, MD;  Location: [place];  Service: Orthopedics;  Laterality: Right;    Family History:  Family History  Problem Relation Age of Onset  . Breast cancer Mother   . Anuerysm Father   . Diabetes Brother   . Brain cancer Brother   . Alcoholism Brother     Social History:  reports that she has never smoked. She has never used smokeless tobacco. She reports that she does not drink alcohol or use drugs.  Allergies:  Allergies  Allergen Reactions  . Ciprofloxacin Nausea Only  . Doxycycline Nausea Only  . Erythromycin Nausea Only  . Hepatitis A  Vaccine Swelling    Medications: Medications in epic reviewed  Please HPI for pertinent positives, otherwise complete 10 system ROS negative.  Mallampati Score: MD Evaluation Airway: WNL Heart: WNL Abdomen: WNL Chest/ Lungs: WNL ASA  Classification: 3 Mallampati/Airway Score: Two  Physical Exam: BP 129/87 (BP Location: Right Arm)   Pulse 98   Temp 97.5 F (36.4 C) (Oral)   Resp 16   Ht 5' 5"  (1.651 m)   Wt 168 lb (76.2 kg)   SpO2 94%   BMI 27.96 kg/m  Body mass index is 27.96 kg/m. General: pleasant, WD, WN white female who is laying in bed in NAD HEENT: head is normocephalic, atraumatic.  Sclera are noninjected.  PERRL.  Ears and nose without any masses or lesions.  Mouth is pink and moist Heart: regular, rate, and rhythm.  Normal s1,s2. No obvious murmurs, gallops, or rubs noted.  Palpable radial and pedal pulses bilaterally Lungs: CTAB, no wheezes, rhonchi, or rales noted.  Respiratory effort nonlabored Abd: soft, NT, ND, +BS, no masses, hernias, or organomegaly.  Current drain with tan purulent foul smelling output Psych: A&Ox3 with an appropriate affect.   Labs: Results for orders placed or performed during the hospital encounter of 02/08/16 (from the past 48 hour(s))  CBC with Differential/Platelet     Status: Abnormal   Collection Time: 02/08/16  7:30 AM  Result Value  Ref Range   WBC 9.2 4.0 - 10.5 K/uL   RBC 4.26 3.87 - 5.11 MIL/uL   Hemoglobin 11.1 (L) 12.0 - 15.0 g/dL   HCT 34.8 (L) 36.0 - 46.0 %   MCV 81.7 78.0 - 100.0 fL   MCH 26.1 26.0 - 34.0 pg   MCHC 31.9 30.0 - 36.0 g/dL   RDW 15.0 11.5 - 15.5 %   Platelets 376 150 - 400 K/uL   Neutrophils Relative % 84 %   Neutro Abs 7.8 (H) 1.7 - 7.7 K/uL   Lymphocytes Relative 8 %   Lymphs Abs 0.7 0.7 - 4.0 K/uL   Monocytes Relative 7 %   Monocytes Absolute 0.6 0.1 - 1.0 K/uL   Eosinophils Relative 1 %   Eosinophils Absolute 0.1 0.0 - 0.7 K/uL   Basophils Relative 0 %   Basophils Absolute 0.0 0.0 - 0.1  K/uL  Comprehensive metabolic panel     Status: Abnormal   Collection Time: 02/08/16  7:30 AM  Result Value Ref Range   Sodium 130 (L) 135 - 145 mmol/L   Potassium 3.5 3.5 - 5.1 mmol/L   Chloride 93 (L) 101 - 111 mmol/L   CO2 27 22 - 32 mmol/L   Glucose, Bld 115 (H) 65 - 99 mg/dL   BUN 6 6 - 20 mg/dL   Creatinine, Ser 0.79 0.44 - 1.00 mg/dL   Calcium 9.2 8.9 - 10.3 mg/dL   Total Protein 6.0 (L) 6.5 - 8.1 g/dL   Albumin 2.5 (L) 3.5 - 5.0 g/dL   AST 20 15 - 41 U/L   ALT 12 (L) 14 - 54 U/L   Alkaline Phosphatase 86 38 - 126 U/L   Total Bilirubin 1.0 0.3 - 1.2 mg/dL   GFR calc non Af Amer >60 >60 mL/min   GFR calc Af Amer >60 >60 mL/min    Comment: (NOTE) The eGFR has been calculated using the CKD EPI equation. This calculation has not been validated in all clinical situations. eGFR's persistently <60 mL/min signify possible Chronic Kidney Disease.    Anion gap 10 5 - 15  Protime-INR     Status: None   Collection Time: 02/08/16  7:30 AM  Result Value Ref Range   Prothrombin Time 14.4 11.4 - 15.2 seconds   INR 1.11     Imaging: Ct Abdomen Pelvis W Contrast  Result Date: 02/07/2016 CLINICAL DATA:  81 year old female with a history of prior abscess drainage. EXAM: CT ABDOMEN AND PELVIS WITH CONTRAST TECHNIQUE: Multidetector CT imaging of the abdomen and pelvis was performed using the standard protocol following bolus administration of intravenous contrast. CONTRAST:  166m ISOVUE-300 IOPAMIDOL (ISOVUE-300) INJECTION 61% COMPARISON:  CT 01/21/2016, 01/20/2016 FINDINGS: Lower chest: Linear scarring/atelectasis at the lung bases. Heart size within normal limits. Calcifications of native coronary vasculature. Small hiatal hernia. Hepatobiliary: Cholelithiasis. No inflammatory changes. No intrahepatic or extrahepatic biliary ductal dilatation. Focal fatty infiltration adjacent to falciform ligament. The scalloping of the right liver margin has decreased compared to the prior CT, status post  drainage. Inflammatory changes adjacent to the pigtail catheter extend to the margin of the liver. Pancreas: Unremarkable pancreas. Spleen: Unremarkable gallbladder. Adrenals/Urinary Tract: Unremarkable appearance of the right adrenal gland. The left adrenal gland is much more normal appearing on the current study, with resolution of the inflammatory changes on the prior. Unremarkable appearance of the left and right kidney. Unremarkable course of the ureters. Partially distended urinary bladder. Stomach/Bowel: Unremarkable appearance of stomach. Small hiatal hernia. Likely duodenum  all diverticulum. Unremarkable appearance of small bowel. Colonic diverticula. No inflammatory changes of the adjacent mesenteric to suggest acute diverticulitis. The appendix is identified extending from the cecum superiorly to the margin of the liver, involving the inflammatory changes beyond the segment 6. Base of the appendix measures 9 mm. Tip of the appendix terminates within the inflammatory changes of the drained abscess. Vascular/Lymphatic: No lymphadenopathy.  Small reactive lymph nodes. No free fluid. Scattered calcifications of the abdominal aorta. No aneurysm or dissection. Reproductive: Calcifications of the left adnexa. Unremarkable uterus. Other: Drainage catheter from a right intercostal approach terminates lateral to segment 6, there is near complete collapse of the fluid collection that was scalloping the margin of the liver. There is, however, persistence of retroperitoneal/ extraperitoneal gas and fluid involving the oblique musculature and the iliacus, with the greatest diameter 8.8 cm x 6.4 cm. This fluid and gas collection displaces the psoas muscle. Uncertain whether there is communication with the persisting fluid and gas with the drain. Musculoskeletal: Configuration of vertebral bodies is unchanged from the comparison, with no new displaced fracture. Multilevel degenerative changes. Degenerative changes of the  hips. IMPRESSION: Persisting abscess of the right retroperitoneum, involving the posterior abdominal wall musculature, iliacus, and the psoas. Greatest diameter measures at least 8.5 cm. Unchanged position of pigtail drainage catheter from right intercostal approach, terminating lateral to the right liver. There is near complete resolution of the abscess. I suspect that the etiology of the abscess is appendicitis/ ruptured appendicitis, given that the base of the appendix is thickened to 9 mm - 10 mm, and the tip terminates into the abscess at the tip of the liver. Alternatively, the tip of the appendix may just be secondarily involved. Improved appearance of the left adrenal gland compared to the prior CT, suggesting resolving inflammation. Continue attention on future follow-up studies. Cholelithiasis. Signed, Dulcy Fanny. Earleen Newport, DO [facility] Radiology Electronically Signed   By: Corrie Mckusick D.O.   On: 02/07/2016 15:39    Assessment/Plan 1. Multiple RTP abscesses -we will plan to place a drain in the persisting RTP fluid collection today.   -her labs have been reviewed as well as her vital signs. -Risks and Benefits discussed with the patient including bleeding, infection, damage to adjacent structures, bowel perforation/fistula connection, and sepsis. All of the patient's questions were answered, patient is agreeable to proceed. Consent signed and in chart.  Thank you for this interesting consult.  I greatly enjoyed meeting Shannon Montgomery and look forward to participating in their care.  A copy of this report was sent to the requesting provider on this date.  Electronically Signed: Henreitta Cea 02/08/2016, 9:06 AM   I spent a total of    25 Minutes in face to face in clinical consultation, greater than 50% of which was counseling/coordinating care for retroperitoneal fluid collection

## 2016-02-08 NOTE — Sedation Documentation (Signed)
Patient denies pain and is resting comfortably.  

## 2016-02-08 NOTE — Procedures (Signed)
Interventional Radiology Procedure Note  Procedure:  CT guided drainage of right retroperitoneal abscess  Complications:  None  Estimated Blood Loss: < 10 mL  Right RP abscess yielded purulent fluid.  Sample sent for culture.  12 Fr drain placed and attached to suction bulb drainage.  Plan:  ID follow up, General Surgery follow up w/ Dr. Derrell [facility]amirez, [facility] and possible drain injection week of 9/25.  Jodi MarbleGlenn T. Fredia SorrowYamagata, M.D Pager:  [phone]

## 2016-02-11 ENCOUNTER — Other Ambulatory Visit: Payer: Self-pay | Admitting: General Surgery

## 2016-02-11 ENCOUNTER — Other Ambulatory Visit (HOSPITAL_COMMUNITY): Payer: Self-pay | Admitting: General Surgery

## 2016-02-11 DIAGNOSIS — K650 Generalized (acute) peritonitis: Secondary | ICD-10-CM

## 2016-02-12 LAB — AEROBIC/ANAEROBIC CULTURE (SURGICAL/DEEP WOUND)

## 2016-02-18 ENCOUNTER — Telehealth: Payer: Self-pay | Admitting: General Surgery

## 2016-02-18 NOTE — Progress Notes (Signed)
Patient called because her oldest drain began leaking some with flushing on Friday.  HH came out and evaluated it.  Since this time, it has not leaked any more.  She is scheduled for a CT scan with drain injections on Thursday of both of her drains.  She states her problem has resolved and no further actions need to be taken.  I told her to call us back between now and Thursday if the problem returned, otherwise we would see her on Thursday in the clinic.  Solangel Mcmanaway E 10:17 AM 02/18/2016

## 2016-02-21 ENCOUNTER — Ambulatory Visit
Admission: RE | Admit: 2016-02-21 | Discharge: 2016-02-21 | Disposition: A | Discharge status: Home / Self Care | Payer: Medicare Other | Source: Ambulatory Visit | Attending: General Surgery | Admitting: General Surgery

## 2016-02-21 ENCOUNTER — Other Ambulatory Visit: Payer: Medicare Other

## 2016-02-21 DIAGNOSIS — K650 Generalized (acute) peritonitis: Secondary | ICD-10-CM

## 2016-02-21 HISTORY — PX: IR GENERIC HISTORICAL: IMG1180011

## 2016-02-21 MED ORDER — IOPAMIDOL (ISOVUE-300) INJECTION 61%
100.0000 mL | Freq: Once | INTRAVENOUS | Status: AC | PRN
  Administered 2016-02-21: 100 mL via INTRAVENOUS

## 2016-02-21 NOTE — Progress Notes (Signed)
Patient ID: Shannon Montgomery, female   DOB: [birth year], 81 y.o.   MRN: [id number]   Referring Physician(s): Axel Filler  Chief Complaint: The patient is seen in follow up today s/p abdominal drain placement on 01-21-16 and on 02-08-16.  History of present illness:  This is a 81 yo white female who was admitted to [facility] on 01/20/16 following a fall at home and several week history of weakness, decreased appetite and intermittent nausea. Subsequent imaging revealed retroperitoneal fluid collection extending from the right iliac fossa to the right hepatic lobe. On 01/21/16 she underwent CT-guided drainage of abscess adjacent to the inferior liver. Fluid cultures revealed Streptococcus constellatus as well as Bacteroides.She was treated with intravenous antibiotics and discharged home on Augmentin.  She followed up in clinic with a repeat CT scan that showed a new fluid collection.  She was sent to [facility] as an outpatient the following day where she had a new drain placed.  She has done well with both drains and the output is minimal with less than 5cc a day for the last week.  She presents today for a new CT scan and drain follow up.  Past Medical History:  Diagnosis Date  . Arthritis   . Cataract   . Closed right ankle fracture   . Fibromyalgia   . GERD (gastroesophageal reflux disease)   . Osteoarthritis   . PMR (polymyalgia rheumatica) (HCC)   . Wears glasses     Past Surgical History:  Procedure Laterality Date  . cornea transplants    . CYSTECTOMY     right ankle  . EYE SURGERY     x3  . I&D EXTREMITY Right 06/08/2013   Procedure: IRRIGATION AND DEBRIDEMENT EXTREMITY/RIGHT ANKLE;  Surgeon: Mable Paris, MD;  Location: [facility] OR;  Service: Orthopedics;  Laterality: Right;  . ORIF ANKLE FRACTURE Right 06/08/2013   Procedure: OPEN REDUCTION INTERNAL FIXATION (ORIF) ANKLE FRACTURE;  Surgeon: Mable Paris, MD;  Location: [facility] OR;  Service: Orthopedics;   Laterality: Right;    Allergies: Ciprofloxacin; Doxycycline; Erythromycin; and Hepatitis a vaccine  Medications: Prior to Admission medications   Medication Sig Start Date End Date Taking? Authorizing Provider  acetaminophen (TYLENOL) 325 MG tablet Take 650 mg by mouth every 6 (six) hours as needed for mild pain or moderate pain.    Historical Provider, MD  amoxicillin-clavulanate (AUGMENTIN) 875-125 MG tablet Take 1 tablet by mouth every 12 (twelve) hours. 01/25/16   Rhetta Mura, MD  aspirin EC 81 MG tablet Take 81 mg by mouth daily.    Historical Provider, MD  Calcium-Magnesium-Zinc [phone] MG TABS Take 1 tablet by mouth daily.    Historical Provider, MD  celecoxib (CELEBREX) 200 MG capsule Take 200 mg by mouth 2 (two) times daily as needed for mild pain or moderate pain.    Historical Provider, MD  Cholecalciferol 2000 UNITS CAPS Take 1 capsule by mouth daily.    Historical Provider, MD  co-enzyme Q-10 30 MG capsule Take 30 mg by mouth 2 (two) times a week.     Historical Provider, MD  CYANOCOBALAMIN PO Take 1 tablet by mouth daily.    Historical Provider, MD  esomeprazole (NEXIUM) 20 MG capsule Take 20 mg by mouth daily as needed (reflux).     Historical Provider, MD  fluorometholone (FML) 0.1 % ophthalmic suspension Place 1 drop into both eyes 2 (two) times daily. 11/05/15   Historical Provider, MD  KRILL OIL ULTRA STRENGTH PO Take 1 capsule by  mouth daily.    Historical Provider, MD  metoCLOPramide (REGLAN) 5 MG tablet Take 5 mg by mouth 3 (three) times daily as needed. 11/06/15   Historical Provider, MD  Multiple Vitamin (MULTIVITAMIN WITH MINERALS) TABS tablet Take 1 tablet by mouth daily.    Historical Provider, MD  Probiotic Product (TRUNATURE DIGESTIVE PROBIOTIC) CAPS Take 1 capsule by mouth daily.    Historical Provider, MD  sodium chloride (OCEAN) 0.65 % SOLN nasal spray Place 1 spray into both nostrils as needed for congestion.    Historical Provider, MD  traMADol (ULTRAM)  50 MG tablet Take 1 tablet by mouth 2 (two) times daily. 12/26/15   Historical Provider, MD  vitamin C (ASCORBIC ACID) 500 MG tablet Take 500 mg by mouth daily.    Historical Provider, MD     Family History  Problem Relation Age of Onset  . Breast cancer Mother   . Anuerysm Father   . Diabetes Brother   . Brain cancer Brother   . Alcoholism Brother     Social History   Social History  . Marital status: Married    Spouse name: N/A  . Number of children: N/A  . Years of education: N/A   Social History Main Topics  . Smoking status: Never Smoker  . Smokeless tobacco: Never Used  . Alcohol use No  . Drug use: No  . Sexual activity: No   Other Topics Concern  . Not on file   Social History Narrative  . No narrative on file     Vital Signs: There were no vitals taken for this visit.  Physical Exam  Gen: Pleasant, NAD white female Abd: soft, NT, right drains in place with minimal output, both sites are c/d/i.  Upper site has some erythema for irritation, but no evidence of infection.  Both drains were removed with no difficulties and gauze and tape were applied.  Imaging: No results found.  Labs:  CBC:  Recent Labs  01/21/16 0126 01/22/16 0951 01/24/16 0349 02/08/16 0730  WBC 26.2* 21.0* 10.9* 9.2  HGB 9.4* 9.6* 9.5* 11.1*  HCT 27.8* 29.3* 29.3* 34.8*  PLT 486* 477* 519* 376    COAGS:  Recent Labs  12/19/15 1154 01/20/16 1627 02/08/16 0730  INR 1.03 1.28 1.11  APTT 34 36  --     BMP:  Recent Labs  01/22/16 0951 01/23/16 0412 01/24/16 0349 02/08/16 0730  NA 124* 128* 130* 130*  K 3.5 2.9* 4.2 3.5  CL 89* 90* 93* 93*  CO2 27 29 31 27   GLUCOSE 126* 95 107* 115*  BUN 8 7 <5* 6  CALCIUM 7.6* 7.9* 8.1* 9.2  CREATININE 0.88 0.85 0.80 0.79  GFRNONAA >60 >60 >60 >60  GFRAA >60 >60 >60 >60    LIVER FUNCTION TESTS:  Recent Labs  01/20/16 0439 01/21/16 0126 01/24/16 0349 02/08/16 0730  BILITOT 0.9 0.8 0.6 1.0  AST 27 23 27 20   ALT 19  18 22  12*  ALKPHOS 185* 152* 137* 86  PROT 6.0* 5.1* 4.8* 6.0*  ALBUMIN 2.2* 1.9* 1.9* 2.5*    Assessment:  1. RTP and intra-abdominal abscesses The CT scan today shows resolution of both abscesses.  Her output is minimal in both drains.  The patient does not want her drains injected.  After discussing with Dr. Archer AsaMcCullough we decided not to inject her drains.  I did inform the patient before proceeding with removal that if she did have a fistula to one of her drains and  we didn't inject to find this, then she could redevelop another fluid collection.  She understood and still wished to proceed with removal.  No further follow up with [place] is warranted.  She is to see Dr. Derrell Lolling and her PCP next week.  Signed: Letha Cape 02/21/2016, 1:29 PM   Please refer to Dr. Henri Medal attestation of this note for management and plan.

## 2016-05-29 ENCOUNTER — Encounter: Payer: Self-pay | Admitting: Radiology

## 2017-02-04 ENCOUNTER — Other Ambulatory Visit: Payer: Self-pay | Admitting: Orthopaedic Surgery

## 2017-02-05 ENCOUNTER — Other Ambulatory Visit: Payer: Self-pay | Admitting: Orthopaedic Surgery

## 2017-02-11 ENCOUNTER — Encounter (HOSPITAL_COMMUNITY): Payer: Self-pay

## 2017-02-11 ENCOUNTER — Other Ambulatory Visit: Payer: Self-pay

## 2017-02-11 ENCOUNTER — Encounter (HOSPITAL_COMMUNITY)
Admission: RE | Admit: 2017-02-11 | Discharge: 2017-02-11 | Disposition: A | Discharge status: Home / Self Care | Payer: Medicare Other | Source: Ambulatory Visit | Attending: Orthopaedic Surgery | Admitting: Orthopaedic Surgery

## 2017-02-11 DIAGNOSIS — I10 Essential (primary) hypertension: Secondary | ICD-10-CM | POA: Insufficient documentation

## 2017-02-11 DIAGNOSIS — M797 Fibromyalgia: Secondary | ICD-10-CM | POA: Diagnosis not present

## 2017-02-11 DIAGNOSIS — E871 Hypo-osmolality and hyponatremia: Secondary | ICD-10-CM | POA: Diagnosis not present

## 2017-02-11 DIAGNOSIS — Z01812 Encounter for preprocedural laboratory examination: Secondary | ICD-10-CM | POA: Insufficient documentation

## 2017-02-11 DIAGNOSIS — D72829 Elevated white blood cell count, unspecified: Secondary | ICD-10-CM | POA: Diagnosis not present

## 2017-02-11 DIAGNOSIS — K7689 Other specified diseases of liver: Secondary | ICD-10-CM | POA: Insufficient documentation

## 2017-02-11 DIAGNOSIS — E559 Vitamin D deficiency, unspecified: Secondary | ICD-10-CM | POA: Insufficient documentation

## 2017-02-11 DIAGNOSIS — R5383 Other fatigue: Secondary | ICD-10-CM | POA: Insufficient documentation

## 2017-02-11 DIAGNOSIS — Z01810 Encounter for preprocedural cardiovascular examination: Secondary | ICD-10-CM | POA: Insufficient documentation

## 2017-02-11 DIAGNOSIS — J90 Pleural effusion, not elsewhere classified: Secondary | ICD-10-CM | POA: Diagnosis not present

## 2017-02-11 HISTORY — DX: Unilateral primary osteoarthritis, right knee: M17.11

## 2017-02-11 LAB — URINALYSIS, ROUTINE W REFLEX MICROSCOPIC
BILIRUBIN URINE: NEGATIVE
Glucose, UA: NEGATIVE mg/dL
HGB URINE DIPSTICK: NEGATIVE
Ketones, ur: NEGATIVE mg/dL
Leukocytes, UA: NEGATIVE
Nitrite: NEGATIVE
PH: 6 (ref 5.0–8.0)
Protein, ur: NEGATIVE mg/dL
SPECIFIC GRAVITY, URINE: 1.005 (ref 1.005–1.030)

## 2017-02-11 LAB — CBC WITH DIFFERENTIAL/PLATELET
BASOS PCT: 0 %
Basophils Absolute: 0 10*3/uL (ref 0.0–0.1)
EOS ABS: 0.1 10*3/uL (ref 0.0–0.7)
Eosinophils Relative: 2 %
HCT: 37.4 % (ref 36.0–46.0)
Hemoglobin: 12.4 g/dL (ref 12.0–15.0)
Lymphocytes Relative: 29 %
Lymphs Abs: 1.7 10*3/uL (ref 0.7–4.0)
MCH: 28.1 pg (ref 26.0–34.0)
MCHC: 33.2 g/dL (ref 30.0–36.0)
MCV: 84.8 fL (ref 78.0–100.0)
MONO ABS: 0.3 10*3/uL (ref 0.1–1.0)
MONOS PCT: 6 %
NEUTROS PCT: 63 %
Neutro Abs: 3.7 10*3/uL (ref 1.7–7.7)
PLATELETS: 260 10*3/uL (ref 150–400)
RBC: 4.41 MIL/uL (ref 3.87–5.11)
RDW: 13.3 % (ref 11.5–15.5)
WBC: 5.9 10*3/uL (ref 4.0–10.5)

## 2017-02-11 LAB — BASIC METABOLIC PANEL
Anion gap: 9 (ref 5–15)
BUN: 11 mg/dL (ref 6–20)
CALCIUM: 9.2 mg/dL (ref 8.9–10.3)
CO2: 25 mmol/L (ref 22–32)
CREATININE: 1.09 mg/dL — AB (ref 0.44–1.00)
Chloride: 95 mmol/L — ABNORMAL LOW (ref 101–111)
GFR calc non Af Amer: 47 mL/min — ABNORMAL LOW (ref >60)
GFR, EST AFRICAN AMERICAN: 54 mL/min — AB (ref >60)
Glucose, Bld: 97 mg/dL (ref 65–99)
Potassium: 4 mmol/L (ref 3.5–5.1)
Sodium: 129 mmol/L — ABNORMAL LOW (ref 135–145)

## 2017-02-11 LAB — TYPE AND SCREEN
ABO/RH(D): O POS
ANTIBODY SCREEN: NEGATIVE

## 2017-02-11 LAB — APTT: aPTT: 33 seconds (ref 24–36)

## 2017-02-11 LAB — SURGICAL PCR SCREEN
MRSA, PCR: NEGATIVE
STAPHYLOCOCCUS AUREUS: NEGATIVE

## 2017-02-11 NOTE — Progress Notes (Addendum)
PCP - Dr. Pearson Grippe  Cardiologist - Denies  Chest x-ray - Denies  EKG - 02/11/17  Stress Test - Denies  ECHO - Denies  Cardiac Cath - Denies  Sleep Study - No CPAP - None  Chart will be given to anesthesia for review due to an abnormal EKG.   Pt denies having chest pain, sob, or fever at this time. All instructions explained to the pt, with a verbal understanding of the material. Pt agrees to go over the instructions while at home for a better understanding. The opportunity to ask questions was provided.

## 2017-02-11 NOTE — Pre-Procedure Instructions (Signed)
Gypsy Lore  02/11/2017      [id number] - [place], [place] - [address] AT [facility] [address] [place] [address] Phone: [phone] Fax: [phone]    Your procedure is scheduled on Tuesday, February 17, 2017  Report to [facility] Admitting Entrance "A" at 6:50 A.M.   Call this number if you have problems the morning of surgery:  [phone]   Remember:  Do not eat food or drink liquids after midnight.  Take these medicines the morning of surgery with A SIP OF WATER: Esomeprazole (NEXIUM), TraMADol (ULTRAM), and Fluorometholone (FML) eye drops.  If needed Sodium chloride (OCEAN) nasal spray for congestion.  As of today, stop taking all Aspirins, Vitamins, Fish oils, and Herbal medications. Also stop all NSAIDS i.e. Advil, Motrin, Aleve, Anaprox, Naproxen, BC and Goody Powders.    Do not wear jewelry, make-up or nail polish.  Do not wear lotions, powders, or perfumes, or deodorant.  Do not shave 48 hours prior to surgery.    Do not bring valuables to the hospital.  [facility] is not responsible for any belongings or valuables.  Contacts, dentures or bridgework may not be worn into surgery.  Leave your suitcase in the car.  After surgery it may be brought to your room.  For patients admitted to the hospital, discharge time will be determined by your treatment team.  Patients discharged the day of surgery will not be allowed to drive home.   Special instructions:  [facility]- Preparing For Surgery  Before surgery, you can play an important role. Because skin is not sterile, your skin needs to be as free of germs as possible. You can reduce the number of germs on your skin by washing with CHG (chlorahexidine gluconate) Soap before surgery.  CHG is an antiseptic cleaner which kills germs and bonds with the skin to continue killing germs even after washing.  Please do not use if you have an allergy to CHG  or antibacterial soaps. If your skin becomes reddened/irritated stop using the CHG.  Do not shave (including legs and underarms) for at least 48 hours prior to first CHG shower. It is OK to shave your face.  Please follow these instructions carefully.   1. Shower the NIGHT BEFORE SURGERY and the MORNING OF SURGERY with CHG.   2. If you chose to wash your hair, wash your hair first as usual with your normal shampoo.  3. After you shampoo, rinse your hair and body thoroughly to remove the shampoo.  4. Use CHG as you would any other liquid soap. You can apply CHG directly to the skin and wash gently with a scrungie or a clean washcloth.   5. Apply the CHG Soap to your body ONLY FROM THE NECK DOWN.  Do not use on open wounds or open sores. Avoid contact with your eyes, ears, mouth and genitals (private parts). Wash genitals (private parts) with your normal soap.  6. Wash thoroughly, paying special attention to the area where your surgery will be performed.  7. Thoroughly rinse your body with warm water from the neck down.  8. DO NOT shower/wash with your normal soap after using and rinsing off the CHG Soap.  9. Pat yourself dry with a CLEAN TOWEL.   10. Wear CLEAN PAJAMAS   11. Place CLEAN SHEETS on your bed the night of your first shower and DO NOT SLEEP WITH PETS.  Day of Surgery: Do not apply any deodorants/lotions. Please wear clean clothes to the hospital/surgery center.    Please read over the following fact sheets that you were given. Pain Booklet, Coughing and Deep Breathing, Blood Transfusion Information, MRSA Information and Surgical Site Infection Prevention

## 2017-02-12 NOTE — Progress Notes (Addendum)
Anesthesia Chart Review:  Pt is an 82 year old female scheduled for R total knee arthroplasty on 02/17/2017 with Marcene Corning, MD  - PCP is Pearson Grippe, MD  PMH includes:  Polymyalgia rheumatica, GERD. Never smoker. BMI 31. S/p ORIF R ankle fracture 06/08/13.   - Hospitalized 8/27-01/25/16 for large retroperitoneal abscess, R liver lobe abscess. 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   Medications include: ASA , nexium  BP (!) 160/84   Pulse 73   Temp (!) 36.4 C   Resp 18   Ht  (1.575 m)   Wt 171 lb 8 oz (77.8 kg)   SpO2 98%   BMI 31.37 kg/m   Preoperative labs reviewed.  Na 129, Cl 95.  This is consistent with prior labs in Epic. More recent results from PCP's office show a Na of 135-136.  Will recheck Na DOS.   EKG 02/11/17: NSR. LAD  If labs acceptable DOS, I anticipate pt can proceed as scheduled.   Rica Mast, FNP-BC [facility]/Anesthesiology Phone: [phone] 02/16/2017 1:21 PM

## 2017-02-16 MED ORDER — CEFAZOLIN SODIUM-DEXTROSE 2-4 GM/100ML-% IV SOLN
2.0000 g | INTRAVENOUS | Status: AC
  Administered 2017-02-17: 2 g via INTRAVENOUS
  Filled 2017-02-16: qty 100

## 2017-02-16 MED ORDER — TRANEXAMIC ACID 1000 MG/10ML IV SOLN
2000.0000 mg | INTRAVENOUS | Status: AC
  Administered 2017-02-17: 2000 mg via TOPICAL
  Filled 2017-02-16: qty 20

## 2017-02-16 NOTE — H&P (Signed)
TOTAL KNEE ADMISSION H&P  Patient is being admitted for right total knee arthroplasty.  Subjective:  Chief Complaint:right knee pain.  HPI: Shannon Montgomery, 82 y.o. female, has a history of pain and functional disability in the right knee due to arthritis and has failed non-surgical conservative treatments for greater than 12 weeks to includeNSAID's and/or analgesics, corticosteriod injections, viscosupplementation injections, flexibility and strengthening excercises, use of assistive devices, weight reduction as appropriate and activity modification.  Onset of symptoms was gradual, starting 5 years ago with gradually worsening course since that time. The patient noted no past surgery on the right knee(s).  Patient currently rates pain in the right knee(s) at 10 out of 10 with activity. Patient has night pain, worsening of pain with activity and weight bearing, pain that interferes with activities of daily living, crepitus and joint swelling.  Patient has evidence of subchondral cysts, subchondral sclerosis, periarticular osteophytes and joint space narrowing by imaging studies. There is no active infection.  Patient Active Problem List   Diagnosis Date Noted  . Hyponatremia 01/20/2016  . Fibromyalgia 01/20/2016  . PMR (polymyalgia rheumatica) (HCC) 01/20/2016  . Liver lesion, right lobe 01/20/2016  . Pleural effusion on right 01/20/2016  . Leukocytosis 01/20/2016  . Ankle fracture 06/08/2013  . Open right ankle fracture 06/08/2013  . Vitamin D deficiency 07/15/2007  . Essential hypertension 07/15/2007  . Esophagitis 07/15/2007  . OTHER MALAISE AND FATIGUE 07/15/2007  . URI 02/12/2007   Past Medical History:  Diagnosis Date  . Arthritis   . Cataract   . Closed right ankle fracture   . Degenerative joint disease of knee, right   . Fibromyalgia   . GERD (gastroesophageal reflux disease)   . Osteoarthritis   . PMR (polymyalgia rheumatica) (HCC)   . Wears glasses     Past Surgical  History:  Procedure Laterality Date  . cornea transplants    . CYSTECTOMY     right ankle  . EYE SURGERY     x3  . I&D EXTREMITY Right 06/08/2013   Procedure: IRRIGATION AND DEBRIDEMENT EXTREMITY/RIGHT ANKLE;  Surgeon: Mable Paris, MD;  Location: [facility] OR;  Service: Orthopedics;  Laterality: Right;  . IR GENERIC HISTORICAL  02/07/2016   IR RADIOLOGIST EVAL & MGMT 02/07/2016 Darrell K Allred, PA-C [facility] INTERV RAD  . IR GENERIC HISTORICAL  02/21/2016   IR RADIOLOGIST EVAL & MGMT 02/21/2016 Barnetta Chapel, PA-C [facility] INTERV RAD  . ORIF ANKLE FRACTURE Right 06/08/2013   Procedure: OPEN REDUCTION INTERNAL FIXATION (ORIF) ANKLE FRACTURE;  Surgeon: Mable Paris, MD;  Location: [facility] OR;  Service: Orthopedics;  Laterality: Right;    No prescriptions prior to admission.   Allergies  Allergen Reactions  . Other Other (See Comments)    PLEASE GIVE PT NAUSEA MED BEFORE GIVING ANY PAIN MEDICATION - PT CAN BECOME VERY SICK   . Ciprofloxacin Nausea Only  . Doxycycline Nausea Only  . Erythromycin Nausea Only  . Hepatitis A Vaccine Swelling    Social History  Substance Use Topics  . Smoking status: Never Smoker  . Smokeless tobacco: Never Used  . Alcohol use No    Family History  Problem Relation Age of Onset  . Breast cancer Mother   . Anuerysm Father   . Diabetes Brother   . Brain cancer Brother   . Alcoholism Brother      Review of Systems  Musculoskeletal: Positive for joint pain.       Right knee  All other systems reviewed  and are negative.   Objective:  Physical Exam  Constitutional: She is oriented to person, place, and time. She appears well-developed and well-nourished.  HENT:  Head: Normocephalic and atraumatic.  Eyes: Pupils are equal, round, and reactive to light.  Neck: Normal range of motion.  Cardiovascular: Normal rate and regular rhythm.   Respiratory: Effort normal.  GI: Soft.  Musculoskeletal:  Right knee has the same valgus deformity which  is moderate.  She has no effusion and no scars.  Range of motion is still 0-110.  She has a right ankle scars with some prominent hardware but good motion and minimal pain.  Sensation and motor function are intact with palpable pulses.  Opposite knee does not exhibit a deformity.  Hip motion is full on both sides and there is no palpable lymphadenopathy.  Neurological: She is alert and oriented to person, place, and time.  Skin: Skin is warm and dry.  Psychiatric: She has a normal mood and affect. Her behavior is normal. Judgment and thought content normal.    Vital signs in last 24 hours:    Labs:   Estimated body mass index is 31.37 kg/m as calculated from the following:   Height as of 02/11/17:  (1.575 m).   Weight as of 02/11/17: 77.8 kg (171 lb 8 oz).   Imaging Review Plain radiographs demonstrate severe degenerative joint disease of the right knee(s). The overall alignment isneutral. The bone quality appears to be good for age and reported activity level.  Assessment/Plan:  End stage primary arthritis, right knee   The patient history, physical examination, clinical judgment of the provider and imaging studies are consistent with end stage degenerative joint disease of the right knee(s) and total knee arthroplasty is deemed medically necessary. The treatment options including medical management, injection therapy arthroscopy and arthroplasty were discussed at length. The risks and benefits of total knee arthroplasty were presented and reviewed. The risks due to aseptic loosening, infection, stiffness, patella tracking problems, thromboembolic complications and other imponderables were discussed. The patient acknowledged the explanation, agreed to proceed with the plan and consent was signed. Patient is being admitted for inpatient treatment for surgery, pain control, PT, OT, prophylactic antibiotics, VTE prophylaxis, progressive ambulation and ADL's and discharge planning. The  patient is planning to be discharged home with home health services

## 2017-02-17 ENCOUNTER — Inpatient Hospital Stay (HOSPITAL_COMMUNITY)
Admission: RE | Admit: 2017-02-17 | Discharge: 2017-02-19 | DRG: 470 | Disposition: A | Discharge status: Home Health | Payer: Medicare Other | Source: Ambulatory Visit | Attending: Orthopaedic Surgery | Admitting: Orthopaedic Surgery

## 2017-02-17 ENCOUNTER — Inpatient Hospital Stay (HOSPITAL_COMMUNITY): Payer: Medicare Other | Admitting: Emergency Medicine

## 2017-02-17 ENCOUNTER — Encounter (HOSPITAL_COMMUNITY): Payer: Self-pay | Admitting: *Deleted

## 2017-02-17 ENCOUNTER — Encounter (HOSPITAL_COMMUNITY)
Admission: RE | Disposition: A | Discharge status: Home Health | Payer: Self-pay | Source: Ambulatory Visit | Attending: Orthopaedic Surgery

## 2017-02-17 DIAGNOSIS — K219 Gastro-esophageal reflux disease without esophagitis: Secondary | ICD-10-CM | POA: Diagnosis present

## 2017-02-17 DIAGNOSIS — Z881 Allergy status to other antibiotic agents status: Secondary | ICD-10-CM | POA: Diagnosis not present

## 2017-02-17 DIAGNOSIS — M1711 Unilateral primary osteoarthritis, right knee: Secondary | ICD-10-CM | POA: Diagnosis present

## 2017-02-17 DIAGNOSIS — J329 Chronic sinusitis, unspecified: Secondary | ICD-10-CM | POA: Diagnosis present

## 2017-02-17 DIAGNOSIS — Z7982 Long term (current) use of aspirin: Secondary | ICD-10-CM | POA: Diagnosis not present

## 2017-02-17 DIAGNOSIS — I10 Essential (primary) hypertension: Secondary | ICD-10-CM | POA: Diagnosis present

## 2017-02-17 DIAGNOSIS — Z887 Allergy status to serum and vaccine status: Secondary | ICD-10-CM | POA: Diagnosis not present

## 2017-02-17 DIAGNOSIS — M797 Fibromyalgia: Secondary | ICD-10-CM | POA: Diagnosis present

## 2017-02-17 DIAGNOSIS — F419 Anxiety disorder, unspecified: Secondary | ICD-10-CM | POA: Diagnosis present

## 2017-02-17 DIAGNOSIS — M353 Polymyalgia rheumatica: Secondary | ICD-10-CM | POA: Diagnosis present

## 2017-02-17 HISTORY — DX: Dorsalgia, unspecified: M54.9

## 2017-02-17 HISTORY — DX: Chronic sinusitis, unspecified: J32.9

## 2017-02-17 HISTORY — DX: Anxiety disorder, unspecified: F41.9

## 2017-02-17 HISTORY — PX: TOTAL KNEE ARTHROPLASTY: SHX125

## 2017-02-17 HISTORY — DX: Other chronic pain: G89.29

## 2017-02-17 LAB — POCT I-STAT 4, (NA,K, GLUC, HGB,HCT)
GLUCOSE: 103 mg/dL — AB (ref 65–99)
HCT: 39 % (ref 36.0–46.0)
HEMOGLOBIN: 13.3 g/dL (ref 12.0–15.0)
POTASSIUM: 4.4 mmol/L (ref 3.5–5.1)
Sodium: 134 mmol/L — ABNORMAL LOW (ref 135–145)

## 2017-02-17 SURGERY — ARTHROPLASTY, KNEE, TOTAL
Anesthesia: General | Site: Knee | Laterality: Right

## 2017-02-17 MED ORDER — VITAMIN D 1000 UNITS PO TABS
2000.0000 [IU] | ORAL_TABLET | Freq: Every day | ORAL | Status: DC
  Administered 2017-02-18 – 2017-02-19 (×2): 2000 [IU] via ORAL
  Filled 2017-02-17 (×5): qty 2

## 2017-02-17 MED ORDER — FENTANYL CITRATE (PF) 100 MCG/2ML IJ SOLN
25.0000 ug | INTRAMUSCULAR | Status: DC | PRN
  Administered 2017-02-17 (×2): 50 ug via INTRAVENOUS

## 2017-02-17 MED ORDER — EPHEDRINE SULFATE 50 MG/ML IJ SOLN
INTRAMUSCULAR | Status: AC
  Filled 2017-02-17: qty 1

## 2017-02-17 MED ORDER — DEXAMETHASONE SODIUM PHOSPHATE 10 MG/ML IJ SOLN
INTRAMUSCULAR | Status: AC
  Filled 2017-02-17: qty 2

## 2017-02-17 MED ORDER — MENTHOL 3 MG MT LOZG: 1.0000 | LOZENGE | OROMUCOSAL | Status: DC | PRN

## 2017-02-17 MED ORDER — VITAMIN B-12 1000 MCG PO TABS
1000.0000 ug | ORAL_TABLET | Freq: Every day | ORAL | Status: DC
  Administered 2017-02-18 – 2017-02-19 (×2): 1000 ug via ORAL
  Filled 2017-02-17 (×2): qty 1

## 2017-02-17 MED ORDER — BUPIVACAINE LIPOSOME 1.3 % IJ SUSP
20.0000 mL | INTRAMUSCULAR | Status: DC
  Filled 2017-02-17: qty 20

## 2017-02-17 MED ORDER — SODIUM CHLORIDE 0.9 % IJ SOLN
INTRAMUSCULAR | Status: DC | PRN
  Administered 2017-02-17: 20 mL

## 2017-02-17 MED ORDER — TRANEXAMIC ACID 1000 MG/10ML IV SOLN
1000.0000 mg | INTRAVENOUS | Status: AC
  Administered 2017-02-17: 1000 mg via INTRAVENOUS
  Filled 2017-02-17: qty 10

## 2017-02-17 MED ORDER — ASPIRIN EC 325 MG PO TBEC
325.0000 mg | DELAYED_RELEASE_TABLET | Freq: Two times a day (BID) | ORAL | Status: DC
  Administered 2017-02-18 – 2017-02-19 (×3): 325 mg via ORAL
  Filled 2017-02-17 (×4): qty 1

## 2017-02-17 MED ORDER — LIDOCAINE 2% (20 MG/ML) 5 ML SYRINGE
INTRAMUSCULAR | Status: AC
  Filled 2017-02-17: qty 10

## 2017-02-17 MED ORDER — PROMETHAZINE HCL 25 MG PO TABS: 12.5000 mg | ORAL_TABLET | Freq: Four times a day (QID) | ORAL | Status: DC | PRN

## 2017-02-17 MED ORDER — CEFAZOLIN SODIUM-DEXTROSE 2-4 GM/100ML-% IV SOLN
2.0000 g | Freq: Four times a day (QID) | INTRAVENOUS | Status: AC
  Administered 2017-02-17 (×2): 2 g via INTRAVENOUS
  Filled 2017-02-17 (×3): qty 100

## 2017-02-17 MED ORDER — ALUM & MAG HYDROXIDE-SIMETH 200-200-20 MG/5ML PO SUSP: 30.0000 mL | ORAL | Status: DC | PRN

## 2017-02-17 MED ORDER — ONDANSETRON HCL 4 MG/2ML IJ SOLN
INTRAMUSCULAR | Status: AC
  Filled 2017-02-17: qty 4

## 2017-02-17 MED ORDER — METOCLOPRAMIDE HCL 5 MG PO TABS: 5.0000 mg | ORAL_TABLET | Freq: Three times a day (TID) | ORAL | Status: DC | PRN

## 2017-02-17 MED ORDER — SODIUM CHLORIDE 0.9 % IJ SOLN
INTRAMUSCULAR | Status: AC
  Filled 2017-02-17: qty 10

## 2017-02-17 MED ORDER — EPHEDRINE SULFATE 50 MG/ML IJ SOLN
INTRAMUSCULAR | Status: DC | PRN
  Administered 2017-02-17 (×2): 5 mg via INTRAVENOUS

## 2017-02-17 MED ORDER — ACETAMINOPHEN 650 MG RE SUPP: 650.0000 mg | Freq: Four times a day (QID) | RECTAL | Status: DC | PRN

## 2017-02-17 MED ORDER — HYDROCODONE-ACETAMINOPHEN 5-325 MG PO TABS
1.0000 | ORAL_TABLET | ORAL | Status: DC | PRN
  Administered 2017-02-17 – 2017-02-18 (×2): 1 via ORAL
  Administered 2017-02-18: 2 via ORAL
  Administered 2017-02-18 – 2017-02-19 (×6): 1 via ORAL
  Filled 2017-02-17 (×8): qty 1
  Filled 2017-02-17: qty 2
  Filled 2017-02-17: qty 1

## 2017-02-17 MED ORDER — ACETAMINOPHEN 325 MG PO TABS: 650.0000 mg | ORAL_TABLET | Freq: Four times a day (QID) | ORAL | Status: DC | PRN

## 2017-02-17 MED ORDER — PANTOPRAZOLE SODIUM 40 MG PO TBEC
40.0000 mg | DELAYED_RELEASE_TABLET | Freq: Every day | ORAL | Status: DC
  Administered 2017-02-18 – 2017-02-19 (×2): 40 mg via ORAL
  Filled 2017-02-17 (×3): qty 1

## 2017-02-17 MED ORDER — BUPIVACAINE-EPINEPHRINE (PF) 0.5% -1:200000 IJ SOLN
INTRAMUSCULAR | Status: AC
  Filled 2017-02-17: qty 30

## 2017-02-17 MED ORDER — LACTATED RINGERS IV SOLN: INTRAVENOUS | Status: DC

## 2017-02-17 MED ORDER — METOCLOPRAMIDE HCL 5 MG/ML IJ SOLN
5.0000 mg | Freq: Three times a day (TID) | INTRAMUSCULAR | Status: DC | PRN
  Administered 2017-02-18: 10 mg via INTRAVENOUS
  Filled 2017-02-17: qty 2

## 2017-02-17 MED ORDER — DIPHENHYDRAMINE HCL 50 MG/ML IJ SOLN
INTRAMUSCULAR | Status: AC
  Filled 2017-02-17: qty 1

## 2017-02-17 MED ORDER — ONDANSETRON HCL 4 MG/2ML IJ SOLN
INTRAMUSCULAR | Status: DC | PRN
  Administered 2017-02-17: 4 mg via INTRAVENOUS

## 2017-02-17 MED ORDER — 0.9 % SODIUM CHLORIDE (POUR BTL) OPTIME
TOPICAL | Status: DC | PRN
  Administered 2017-02-17: 1000 mL

## 2017-02-17 MED ORDER — BISACODYL 5 MG PO TBEC: 5.0000 mg | DELAYED_RELEASE_TABLET | Freq: Every day | ORAL | Status: DC | PRN

## 2017-02-17 MED ORDER — BUPIVACAINE LIPOSOME 1.3 % IJ SUSP
INTRAMUSCULAR | Status: DC | PRN
  Administered 2017-02-17: 20 mL

## 2017-02-17 MED ORDER — FENTANYL CITRATE (PF) 100 MCG/2ML IJ SOLN: 25.0000 ug | INTRAMUSCULAR | Status: DC | PRN

## 2017-02-17 MED ORDER — DOCUSATE SODIUM 100 MG PO CAPS
100.0000 mg | ORAL_CAPSULE | Freq: Two times a day (BID) | ORAL | Status: DC
  Administered 2017-02-18 – 2017-02-19 (×3): 100 mg via ORAL
  Filled 2017-02-17 (×5): qty 1

## 2017-02-17 MED ORDER — FLUOROMETHOLONE 0.1 % OP SUSP
1.0000 [drp] | Freq: Every day | OPHTHALMIC | Status: DC
  Administered 2017-02-19: 1 [drp] via OPHTHALMIC
  Filled 2017-02-17: qty 5

## 2017-02-17 MED ORDER — METHOCARBAMOL 500 MG PO TABS
500.0000 mg | ORAL_TABLET | Freq: Four times a day (QID) | ORAL | Status: DC | PRN
  Administered 2017-02-17 – 2017-02-19 (×6): 500 mg via ORAL
  Filled 2017-02-17 (×5): qty 1

## 2017-02-17 MED ORDER — PHENOL 1.4 % MT LIQD: 1.0000 | OROMUCOSAL | Status: DC | PRN

## 2017-02-17 MED ORDER — ONDANSETRON HCL 4 MG/2ML IJ SOLN
4.0000 mg | Freq: Four times a day (QID) | INTRAMUSCULAR | Status: DC | PRN
  Administered 2017-02-17: 4 mg via INTRAVENOUS
  Filled 2017-02-17: qty 2

## 2017-02-17 MED ORDER — LACTATED RINGERS IV SOLN: INTRAVENOUS | Status: DC | PRN

## 2017-02-17 MED ORDER — METHOCARBAMOL 500 MG PO TABS
ORAL_TABLET | ORAL | Status: AC
  Filled 2017-02-17: qty 1

## 2017-02-17 MED ORDER — DEXAMETHASONE SODIUM PHOSPHATE 10 MG/ML IJ SOLN
INTRAMUSCULAR | Status: DC | PRN
  Administered 2017-02-17: 10 mg via INTRAVENOUS

## 2017-02-17 MED ORDER — FENTANYL CITRATE (PF) 100 MCG/2ML IJ SOLN
INTRAMUSCULAR | Status: AC
  Filled 2017-02-17: qty 2

## 2017-02-17 MED ORDER — TRANEXAMIC ACID 1000 MG/10ML IV SOLN
1000.0000 mg | Freq: Once | INTRAVENOUS | Status: AC
  Administered 2017-02-17: 1000 mg via INTRAVENOUS
  Filled 2017-02-17 (×2): qty 10

## 2017-02-17 MED ORDER — CHLORHEXIDINE GLUCONATE 4 % EX LIQD: 60.0000 mL | Freq: Once | CUTANEOUS | Status: DC

## 2017-02-17 MED ORDER — LACTATED RINGERS IV SOLN
INTRAVENOUS | Status: DC
  Administered 2017-02-17 (×2): via INTRAVENOUS

## 2017-02-17 MED ORDER — DIPHENHYDRAMINE HCL 12.5 MG/5ML PO ELIX: 12.5000 mg | ORAL_SOLUTION | ORAL | Status: DC | PRN

## 2017-02-17 MED ORDER — SODIUM CHLORIDE 0.9 % IR SOLN
Status: DC | PRN
  Administered 2017-02-17: 3000 mL

## 2017-02-17 MED ORDER — BUPIVACAINE-EPINEPHRINE (PF) 0.5% -1:200000 IJ SOLN
INTRAMUSCULAR | Status: DC | PRN
  Administered 2017-02-17: 20 mL

## 2017-02-17 MED ORDER — METOCLOPRAMIDE HCL 5 MG PO TABS: 5.0000 mg | ORAL_TABLET | ORAL | Status: DC

## 2017-02-17 MED ORDER — FENTANYL CITRATE (PF) 250 MCG/5ML IJ SOLN
INTRAMUSCULAR | Status: AC
  Filled 2017-02-17: qty 5

## 2017-02-17 MED ORDER — LIDOCAINE HCL (CARDIAC) 20 MG/ML IV SOLN
INTRAVENOUS | Status: DC | PRN
  Administered 2017-02-17: 80 mg via INTRAVENOUS

## 2017-02-17 MED ORDER — DEXTROSE 5 % IV SOLN
500.0000 mg | Freq: Four times a day (QID) | INTRAVENOUS | Status: DC | PRN
  Filled 2017-02-17: qty 5

## 2017-02-17 MED ORDER — MIDAZOLAM HCL 2 MG/2ML IJ SOLN
1.5000 mg | Freq: Once | INTRAMUSCULAR | Status: AC
  Administered 2017-02-17: 1.5 mg via INTRAVENOUS

## 2017-02-17 MED ORDER — ONDANSETRON HCL 4 MG PO TABS: 4.0000 mg | ORAL_TABLET | Freq: Four times a day (QID) | ORAL | Status: DC | PRN

## 2017-02-17 MED ORDER — KETOROLAC TROMETHAMINE 30 MG/ML IJ SOLN
INTRAMUSCULAR | Status: AC
  Filled 2017-02-17: qty 1

## 2017-02-17 MED ORDER — PROPOFOL 10 MG/ML IV BOLUS
INTRAVENOUS | Status: DC | PRN
  Administered 2017-02-17: 150 mg via INTRAVENOUS

## 2017-02-17 MED ORDER — FENTANYL CITRATE (PF) 100 MCG/2ML IJ SOLN
INTRAMUSCULAR | Status: AC
  Administered 2017-02-17: 50 ug via INTRAVENOUS
  Filled 2017-02-17: qty 2

## 2017-02-17 MED ORDER — FENTANYL CITRATE (PF) 100 MCG/2ML IJ SOLN
INTRAMUSCULAR | Status: DC | PRN
  Administered 2017-02-17: 25 ug via INTRAVENOUS
  Administered 2017-02-17: 100 ug via INTRAVENOUS

## 2017-02-17 MED ORDER — HYDROMORPHONE HCL 1 MG/ML IJ SOLN
0.5000 mg | INTRAMUSCULAR | Status: DC | PRN
  Administered 2017-02-18: 1 mg via INTRAVENOUS
  Filled 2017-02-17 (×2): qty 1

## 2017-02-17 MED ORDER — FENTANYL CITRATE (PF) 100 MCG/2ML IJ SOLN
50.0000 ug | Freq: Once | INTRAMUSCULAR | Status: AC
  Administered 2017-02-17: 50 ug via INTRAVENOUS

## 2017-02-17 MED ORDER — MIDAZOLAM HCL 2 MG/2ML IJ SOLN
INTRAMUSCULAR | Status: AC
  Administered 2017-02-17: 1.5 mg via INTRAVENOUS
  Filled 2017-02-17: qty 2

## 2017-02-17 SURGICAL SUPPLY — 58 items
BAG DECANTER FOR FLEXI CONT (MISCELLANEOUS) ×2 IMPLANT
BANDAGE ACE 6X5 VEL STRL LF (GAUZE/BANDAGES/DRESSINGS) ×2 IMPLANT
BANDAGE ESMARK 6X9 LF (GAUZE/BANDAGES/DRESSINGS) ×1 IMPLANT
BLADE SAGITTAL 25.0X1.19X90 (BLADE) ×1 IMPLANT
BLADE SAGITTAL 25.0X1.19X90MM (BLADE) ×1
BLADE SAW SGTL 13.0X1.19X90.0M (BLADE) IMPLANT
BNDG CMPR 9X6 STRL LF SNTH (GAUZE/BANDAGES/DRESSINGS) ×1
BNDG CMPR MED 10X6 ELC LF (GAUZE/BANDAGES/DRESSINGS) ×1
BNDG ELASTIC 6X10 VLCR STRL LF (GAUZE/BANDAGES/DRESSINGS) ×3 IMPLANT
BNDG ESMARK 6X9 LF (GAUZE/BANDAGES/DRESSINGS) ×3
BOWL SMART MIX CTS (DISPOSABLE) ×3 IMPLANT
CAP KNEE TOTAL 3 SIGMA ×2 IMPLANT
CEMENT HV SMART SET (Cement) ×4 IMPLANT
CLOSURE STERI-STRIP 1/2X4 (GAUZE/BANDAGES/DRESSINGS) ×1
CLOSURE WOUND 1/2 X4 (GAUZE/BANDAGES/DRESSINGS) ×1
CLSR STERI-STRIP ANTIMIC 1/2X4 (GAUZE/BANDAGES/DRESSINGS) ×1 IMPLANT
COVER SURGICAL LIGHT HANDLE (MISCELLANEOUS) ×3 IMPLANT
CUFF TOURNIQUET SINGLE 34IN LL (TOURNIQUET CUFF) ×3 IMPLANT
CUFF TOURNIQUET SINGLE 44IN (TOURNIQUET CUFF) IMPLANT
DECANTER SPIKE VIAL GLASS SM (MISCELLANEOUS) ×3 IMPLANT
DRAPE EXTREMITY T 121X128X90 (DRAPE) ×3 IMPLANT
DRAPE HALF SHEET 40X57 (DRAPES) ×6 IMPLANT
DRAPE U-SHAPE 47X51 STRL (DRAPES) ×3 IMPLANT
DRSG AQUACEL AG ADV 3.5X10 (GAUZE/BANDAGES/DRESSINGS) ×3 IMPLANT
DURAPREP 26ML APPLICATOR (WOUND CARE) ×3 IMPLANT
ELECT REM PT RETURN 9FT ADLT (ELECTROSURGICAL) ×3
ELECTRODE REM PT RTRN 9FT ADLT (ELECTROSURGICAL) ×1 IMPLANT
GLOVE BIO SURGEON STRL SZ8 (GLOVE) ×6 IMPLANT
GLOVE BIOGEL PI IND STRL 8 (GLOVE) ×2 IMPLANT
GLOVE BIOGEL PI INDICATOR 8 (GLOVE) ×4
GOWN STRL REUS W/ TWL LRG LVL3 (GOWN DISPOSABLE) ×1 IMPLANT
GOWN STRL REUS W/ TWL XL LVL3 (GOWN DISPOSABLE) ×2 IMPLANT
GOWN STRL REUS W/TWL LRG LVL3 (GOWN DISPOSABLE) ×3
GOWN STRL REUS W/TWL XL LVL3 (GOWN DISPOSABLE) ×6
HANDPIECE INTERPULSE COAX TIP (DISPOSABLE) ×3
HOOD PEEL AWAY FACE SHEILD DIS (HOOD) ×6 IMPLANT
IMMOBILIZER KNEE 22 UNIV (SOFTGOODS) ×3 IMPLANT
KIT BASIN OR (CUSTOM PROCEDURE TRAY) ×3 IMPLANT
KIT ROOM TURNOVER OR (KITS) ×3 IMPLANT
MANIFOLD NEPTUNE II (INSTRUMENTS) ×3 IMPLANT
NDL HYPO 21X1 ECLIPSE (NEEDLE) ×1 IMPLANT
NEEDLE HYPO 21X1 ECLIPSE (NEEDLE) ×3 IMPLANT
NS IRRIG 1000ML POUR BTL (IV SOLUTION) ×3 IMPLANT
PACK TOTAL JOINT (CUSTOM PROCEDURE TRAY) ×3 IMPLANT
PAD ARMBOARD 7.5X6 YLW CONV (MISCELLANEOUS) ×6 IMPLANT
SET HNDPC FAN SPRY TIP SCT (DISPOSABLE) ×1 IMPLANT
STRIP CLOSURE SKIN 1/2X4 (GAUZE/BANDAGES/DRESSINGS) ×2 IMPLANT
SUT VIC AB 0 CT1 27 (SUTURE) ×3
SUT VIC AB 0 CT1 27XBRD ANBCTR (SUTURE) ×1 IMPLANT
SUT VIC AB 2-0 CT1 27 (SUTURE) ×3
SUT VIC AB 2-0 CT1 TAPERPNT 27 (SUTURE) ×1 IMPLANT
SUT VIC AB 3-0 FS2 27 (SUTURE) ×3 IMPLANT
SUT VLOC 180 0 24IN GS25 (SUTURE) ×3 IMPLANT
SYR 50ML LL SCALE MARK (SYRINGE) ×3 IMPLANT
TOWEL OR 17X24 6PK STRL BLUE (TOWEL DISPOSABLE) ×3 IMPLANT
TOWEL OR 17X26 10 PK STRL BLUE (TOWEL DISPOSABLE) ×3 IMPLANT
TRAY CATH 16FR W/PLASTIC CATH (SET/KITS/TRAYS/PACK) IMPLANT
UPCHARGE REV TRAY MBT KNEE ×2 IMPLANT

## 2017-02-17 NOTE — Anesthesia Postprocedure Evaluation (Signed)
Anesthesia Post Note  Patient: Shannon Montgomery  Procedure(s) Performed: Procedure(s) (LRB): TOTAL KNEE ARTHROPLASTY (Right)     Patient location during evaluation: PACU Anesthesia Type: General Level of consciousness: awake Pain management: pain level controlled Vital Signs Assessment: post-procedure vital signs reviewed and stable Respiratory status: spontaneous breathing Cardiovascular status: stable Anesthetic complications: no    Last Vitals:  Vitals:   02/17/17 1215 02/17/17 1230  BP:    Pulse: (!) 59 75  Resp: 13 12  Temp:    SpO2: 100% 95%    Last Pain:  Vitals:   02/17/17 1200  TempSrc:   PainSc: 3                  Gregary Blackard

## 2017-02-17 NOTE — Transfer of Care (Signed)
Immediate Anesthesia Transfer of Care Note  Patient: ARIENNE GARTIN  Procedure(s) Performed: Procedure(s): TOTAL KNEE ARTHROPLASTY (Right)  Patient Location: PACU  Anesthesia Type:GA combined with regional for post-op pain  Level of Consciousness: drowsy  Airway & Oxygen Therapy: Patient Spontanous Breathing and Patient connected to nasal cannula oxygen  Post-op Assessment: Report given to RN and Post -op Vital signs reviewed and stable  Post vital signs: Reviewed and stable  Last Vitals:  Vitals:   02/17/17 0831 02/17/17 1118  BP: (!) 147/80 (!) 150/74  Pulse: 92 68  Resp: 20 17  Temp:  (!) 36.3 C  SpO2: 99% 100%    Last Pain:  Vitals:   02/17/17 0829  TempSrc:   PainSc: 6          Complications: No apparent anesthesia complications

## 2017-02-17 NOTE — Op Note (Signed)
PREOP DIAGNOSIS: DJD RIGHT KNEE POSTOP DIAGNOSIS: same PROCEDURE: RIGHT TKR ANESTHESIA: General and block ATTENDING SURGEON: Itzayana Pardy G ASSISTANT: Elodia Florence PA  INDICATIONS FOR PROCEDURE: Shannon Montgomery is a 82 y.o. female who has struggled for a long time with pain due to degenerative arthritis of the right knee.  The patient has failed many conservative non-operative measures and at this point has pain which limits the ability to sleep and walk.  The patient is offered total knee replacement.  Informed operative consent was obtained after discussion of possible risks of anesthesia, infection, neurovascular injury, DVT, and death.  The importance of the post-operative rehabilitation protocol to optimize result was stressed extensively with the patient.  SUMMARY OF FINDINGS AND PROCEDURE:  Shannon Montgomery was taken to the operative suite where under the above anesthesia a right knee replacement was performed.  There were advanced degenerative changes and the bone quality was fair.  We used the DePuy LCS system and placed size standard femur, 4 MBT tibia, 32 mm all polyethylene patella, and a size 10 mm spacer.  Elodia Florence PA-C assisted throughout and was invaluable to the completion of the case in that he helped retract and maintain exposure while I placed components.  He also helped close thereby minimizing OR time.  The patient was admitted for appropriate post-op care to include perioperative antibiotics and mechanical and pharmacologic measures for DVT prophylaxis.  DESCRIPTION OF PROCEDURE:  Shannon Montgomery was taken to the operative suite where the above anesthesia was applied.  The patient was positioned supine and prepped and draped in normal sterile fashion.  An appropriate time out was performed.  After the administration of kefzol pre-op antibiotic the leg was elevated and exsanguinated and a tourniquet inflated. A standard longitudinal incision was made on the anterior knee.   Dissection was carried down to the extensor mechanism.  All appropriate anti-infective measures were used including the pre-operative antibiotic, betadine impregnated drape, and closed hooded exhaust systems for each member of the surgical team.  A medial parapatellar incision was made in the extensor mechanism and the knee cap flipped and the knee flexed.  Some residual meniscal tissues were removed along with any remaining ACL/PCL tissue.  A guide was placed on the tibia and a flat cut was made on it's superior surface.  An intramedullary guide was placed in the femur and was utilized to make anterior and posterior cuts creating an appropriate flexion gap.  A second intramedullary guide was placed in the femur to make a distal cut properly balancing the knee with an extension gap equal to the flexion gap.  The three bones sized to the above mentioned sizes and the appropriate guides were placed and utilized.  A trial reduction was done and the knee easily came to full extension and the patella tracked well on flexion.  The trial components were removed and all bones were cleaned with pulsatile lavage and then dried thoroughly.  Cement was mixed and was pressurized onto the bones followed by placement of the aforementioned components.  Excess cement was trimmed and pressure was held on the components until the cement had hardened.  The tourniquet was deflated and a small amount of bleeding was controlled with cautery and pressure.  The knee was irrigated thoroughly.  The extensor mechanism was re-approximated with V-loc suture in running fashion.  The knee was flexed and the repair was solid.  The subcutaneous tissues were re-approximated with #0 and #2-0 vicryl and the skin closed with  a subcuticular stitch and steristrips.  A sterile dressing was applied.  Intraoperative fluids, EBL, and tourniquet time can be obtained from anesthesia records.  DISPOSITION:  The patient was taken to recovery room in stable  condition and admitted for appropriate post-op care to include peri-operative antibiotic and DVT prophylaxis with mechanical and pharmacologic measures.  Shannon Montgomery G 02/17/2017, 10:45 AM

## 2017-02-17 NOTE — Progress Notes (Signed)
Orthopedic Tech Progress Note Patient Details:  Shannon Montgomery 16-Apr-1935 [id number]  Ortho Devices Ortho Device/Splint Location: applied cpm to pt right knee. pt tolerated application very well.  right knee.  (because of pt age 82 years old, pt did not get a Overhead frame.)   Alvina Chou 02/17/2017, 12:00 PM

## 2017-02-17 NOTE — Anesthesia Procedure Notes (Signed)
Procedure Name: LMA Insertion Date/Time: 02/17/2017 9:02 AM Performed by: Samara Deist Pre-anesthesia Checklist: Patient identified, Emergency Drugs available, Suction available, Patient being monitored and Timeout performed Patient Re-evaluated:Patient Re-evaluated prior to induction Oxygen Delivery Method: Circle system utilized Preoxygenation: Pre-oxygenation with 100% oxygen Induction Type: IV induction LMA: LMA inserted LMA Size: 4.0 Number of attempts: 1 Placement Confirmation: positive ETCO2 and breath sounds checked- equal and bilateral Dental Injury: Teeth and Oropharynx as per pre-operative assessment

## 2017-02-17 NOTE — Anesthesia Procedure Notes (Signed)
Anesthesia Regional Block: Adductor canal block   Pre-Anesthetic Checklist: ,, timeout performed, Correct Patient, Correct Site, Correct Laterality, Correct Procedure, Correct Position, site marked, Risks and benefits discussed, Surgical consent,  Pre-op evaluation,  At surgeon's request  Laterality: Right     Needles:  Injection technique: Single-shot  Needle Type: Stimulator Needle - 80          Additional Needles:   Procedures: Doppler guided,,,, ultrasound used (permanent image in chart),,,,  Narrative:  Start time: 02/17/2017 8:20 AM End time: 02/17/2017 8:35 AM Injection made incrementally with aspirations every 5 mL.  Performed by: Personally  Anesthesiologist: Dorris Singh

## 2017-02-17 NOTE — Anesthesia Preprocedure Evaluation (Signed)
Anesthesia Evaluation  Patient identified by MRN, date of birth, ID band Patient awake    Reviewed: Allergy & Precautions, NPO status , Patient's Chart, lab work & pertinent test results  History of Anesthesia Complications (+) PSEUDOCHOLINESTERASE DEFICIENCY  Airway Mallampati: II  TM Distance: >3 FB     Dental   Pulmonary neg pulmonary ROS,    breath sounds clear to auscultation       Cardiovascular hypertension,  Rhythm:Regular Rate:Normal     Neuro/Psych    GI/Hepatic Neg liver ROS, GERD  ,  Endo/Other  negative endocrine ROS  Renal/GU negative Renal ROS     Musculoskeletal  (+) Arthritis , Fibromyalgia -  Abdominal   Peds  Hematology   Anesthesia Other Findings   Reproductive/Obstetrics                             Anesthesia Physical Anesthesia Plan  ASA: III  Anesthesia Plan: General   Post-op Pain Management:  Regional for Post-op pain   Induction: Intravenous  PONV Risk Score and Plan: 3 and Ondansetron, Dexamethasone, Propofol infusion and Treatment may vary due to age or medical condition  Airway Management Planned:   Additional Equipment:   Intra-op Plan:   Post-operative Plan: Extubation in OR  Informed Consent: I have reviewed the patients History and Physical, chart, labs and discussed the procedure including the risks, benefits and alternatives for the proposed anesthesia with the patient or authorized representative who has indicated his/her understanding and acceptance.   Dental advisory given  Plan Discussed with: CRNA and Anesthesiologist  Anesthesia Plan Comments:         Anesthesia Quick Evaluation

## 2017-02-17 NOTE — Interval H&P Note (Signed)
History and Physical Interval Note:  02/17/2017 8:24 AM  Shannon Montgomery  has presented today for surgery, with the diagnosis of RIGHT KNEE DEGENERATIVE JOINT DISEASE  The various methods of treatment have been discussed with the patient and family. After consideration of risks, benefits and other options for treatment, the patient has consented to  Procedure(s): TOTAL KNEE ARTHROPLASTY (Right) as a surgical intervention .  The patient's history has been reviewed, patient examined, no change in status, stable for surgery.  I have reviewed the patient's chart and labs.  Questions were answered to the patient's satisfaction.     [name]

## 2017-02-18 ENCOUNTER — Encounter (HOSPITAL_COMMUNITY): Payer: Self-pay | Admitting: General Practice

## 2017-02-18 MED ORDER — SODIUM CHLORIDE 0.9% FLUSH
3.0000 mL | Freq: Two times a day (BID) | INTRAVENOUS | Status: DC
  Administered 2017-02-18 – 2017-02-19 (×2): 3 mL via INTRAVENOUS

## 2017-02-18 MED ORDER — SODIUM CHLORIDE 0.9% FLUSH: 3.0000 mL | INTRAVENOUS | Status: DC | PRN

## 2017-02-18 NOTE — Progress Notes (Signed)
OT Cancellation Note  Patient Details Name: OLIVETTE BECKMANN MRN: [id number] DOB: [birth year]   Cancelled Treatment:    Reason Eval/Treat Not Completed: Pain limiting ability to participate.  Pt reports knee pain 10/10 - RN notified.  Will reattempt.   Zelia Yzaguirre Gene Autry, OTR/L [phone]   Jeani Hawking M 02/18/2017, 5:40 PM

## 2017-02-18 NOTE — Progress Notes (Signed)
Subjective: 1 Day Post-Op Procedure(s) (LRB): TOTAL KNEE ARTHROPLASTY (Right)  Activity level:  wbat Diet tolerance:  ok Voiding:  ok Patient reports pain as mild and moderate.    Objective: Vital signs in last 24 hours: Temp:  [97.4 F (36.3 C)-98.2 F (36.8 C)] 97.9 F (36.6 C) (09/26 0330) Pulse Rate:  [54-93] 86 (09/26 0330) Resp:  [10-25] 17 (09/26 0330) BP: (109-186)/(68-139) 156/82 (09/26 0330) SpO2:  [95 %-100 %] 99 % (09/26 0330)  Labs:  Recent Labs  02/17/17 0747  HGB 13.3    Recent Labs  02/17/17 0747  HCT 39.0    Recent Labs  02/17/17 0747  NA 134*  K 4.4  GLUCOSE 103*   No results for input(s): LABPT, INR in the last 72 hours.  Physical Exam:  Neurologically intact ABD soft Neurovascular intact Sensation intact distally Intact pulses distally Dorsiflexion/Plantar flexion intact Incision: dressing C/D/I and no drainage No cellulitis present Compartment soft  Assessment/Plan:  1 Day Post-Op Procedure(s) (LRB): TOTAL KNEE ARTHROPLASTY (Right) Advance diet Up with therapy D/C IV fluids Plan for discharge tomorrow Discharge home with home health if doing well and cleared by PT. Continue on ASA  BID x 2 weeks post op. Follow up in office 2 weeks post op. I will remove ACE wrap tomorrow.  Taron Conrey, Ginger Organ 02/18/2017, 7:53 AM

## 2017-02-18 NOTE — Progress Notes (Signed)
Physical Therapy Treatment Patient Details Name: Shannon Montgomery MRN: [id number] DOB: Sep 09, 1934 Today's Date: 02/18/2017    History of Present Illness Pt is a 82 yo female admitted 9/25 for elective R TKA. PMH: fibromyalgia, h/o R ankle fx s/p ORIF, PMR.    PT Comments    Pt progressing well. Pt with excellent R knee ROM for day after surgery. Pt functioning at min guard level. Acute PT to con't to follow.   Follow Up Recommendations  Home health PT;Supervision/Assistance - 24 hour     Equipment Recommendations  None recommended by PT    Recommendations for Other Services       Precautions / Restrictions Precautions Precautions: Knee Precaution Booklet Issued: Yes (comment) Restrictions Weight Bearing Restrictions: Yes RLE Weight Bearing: Weight bearing as tolerated    Mobility  Bed Mobility Overal bed mobility: Needs Assistance Bed Mobility: Supine to Sit;Sit to Supine     Supine to sit: Min guard Sit to supine: Min assist   General bed mobility comments: v/c's to perform R quad set prior to abuction out of the bed  Transfers Overall transfer level: Needs assistance Equipment used: Rolling walker (2 wheeled) Transfers: Sit to/from Stand Sit to Stand: Min guard         General transfer comment: v/c's for safe hand placement, increased time, min guard for stability during transition of hands from bed to RW  Ambulation/Gait Ambulation/Gait assistance: Min guard Ambulation Distance (Feet): 120 Feet Assistive device: Rolling walker (2 wheeled) Gait Pattern/deviations: Step-through pattern;Decreased stride length;Decreased weight shift to right;Decreased stance time - right;Narrow base of support Gait velocity: dec Gait velocity interpretation: Below normal speed for age/gender General Gait Details: max encouragement to complete small loop due to pain and fatigue however pt did well. v/c's to stay in walker and emphasis on heel/toe pattern   Stairs             Wheelchair Mobility    Modified Rankin (Stroke Patients Only)       Balance Overall balance assessment: No apparent balance deficits (not formally assessed)                                          Cognition Arousal/Alertness: Awake/alert Behavior During Therapy: WFL for tasks assessed/performed Overall Cognitive Status: Within Functional Limits for tasks assessed                                        Exercises Total Joint Exercises Ankle Circles/Pumps: AROM;Both;10 reps;Supine Quad Sets: AROM;Right;10 reps;Supine Heel Slides: AROM;AAROM;Right;20 reps;Seated (with towel) Goniometric ROM: 5-90 AA R knee flexion in sitting    General Comments        Pertinent Vitals/Pain Pain Assessment: 0-10 Pain Score: 6  Pain Location: R knee Pain Descriptors / Indicators: Sore Pain Intervention(s): Monitored during session    Home Living                      Prior Function            PT Goals (current goals can now be found in the care plan section) Acute Rehab PT Goals Patient Stated Goal: go home    Frequency    7X/week      PT Plan Current plan remains appropriate    Co-evaluation  AM-PAC PT "6 Clicks" Daily Activity  Outcome Measure  Difficulty turning over in bed (including adjusting bedclothes, sheets and blankets)?: A Little Difficulty moving from lying on back to sitting on the side of the bed? : A Little Difficulty sitting down on and standing up from a chair with arms (e.g., wheelchair, bedside commode, etc,.)?: A Little Help needed moving to and from a bed to chair (including a wheelchair)?: A Little Help needed walking in hospital room?: A Little Help needed climbing 3-5 steps with a railing? : A Lot 6 Click Score: 17    End of Session Equipment Utilized During Treatment: Gait belt Activity Tolerance: Patient tolerated treatment well Patient left: with call bell/phone within  reach;with family/visitor present;in bed Nurse Communication: Mobility status PT Visit Diagnosis: Pain;Difficulty in walking, not elsewhere classified (R26.2) Pain - Right/Left: Right Pain - part of body: Knee     Time: 1610-9604 PT Time Calculation (min) (ACUTE ONLY): 27 min  Charges:  $Gait Training: 8-22 mins $Therapeutic Exercise: 8-22 mins                    G Codes:       Lewis Shock, PT, DPT Pager #: [phone] Office #: [phone]    Demiyah Fischbach M Keirstyn Aydt 02/18/2017, 2:32 PM

## 2017-02-18 NOTE — Care Management Note (Signed)
Case Management Note  Patient Details  Name: Shannon Montgomery MRN: [id number] Date of Birth: 1934/12/07  Subjective/Objective:   82 yr old female s/p right total knee arthroplasty.                 Action/Plan:  Case manager spoke with patient and her husband concerning discharge  And DME. Patient was preoperatively setup with [facility], no changes. Patient has RW and 3in1, CPM will be delivered to her home. Patient will have family support at discharge.     Expected Discharge Date:   02/19/17               Expected Discharge Plan:  Home w Home Health Services  In-House Referral:  NA  Discharge planning Services  CM Consult  Post Acute Care Choice:  Home Health, Durable Medical Equipment Choice offered to:     DME Arranged:  CPM DME Agency:  [employer]  HH Arranged:  PT HH Agency:  [facility]  Status of Service:  Completed, signed off  If discussed at Long Length of Stay Meetings, dates discussed:    Additional Comments:  Durenda Guthrie, RN 02/18/2017, 12:21 PM

## 2017-02-18 NOTE — Evaluation (Signed)
Physical Therapy Evaluation Patient Details Name: Shannon Montgomery MRN: [id number] DOB: 02/25/1935 Today's Date: 02/18/2017   History of Present Illness  Pt is a 82 yo female admitted 9/25 for elective R TKA. PMH: fibromyalgia, h/o R ankle fx s/p ORIF, PMR.  Clinical Impression  Pt is s/p TKA resulting in the deficits listed below (see PT Problem List). Pt tolerated OOB mobility well for first time up. Pt will benefit from skilled PT to increase their independence and safety with mobility to allow discharge to the venue listed below.      Follow Up Recommendations Home health PT;Supervision/Assistance - 24 hour    Equipment Recommendations  None recommended by PT    Recommendations for Other Services       Precautions / Restrictions Precautions Precautions: Knee Precaution Booklet Issued: Yes (comment) Precaution Comments: pt with verbal understanding Restrictions Weight Bearing Restrictions: No      Mobility  Bed Mobility Overal bed mobility: Needs Assistance Bed Mobility: Supine to Sit     Supine to sit: Supervision     General bed mobility comments: hob elevated, used bed rail, able to mananage bilat LEs without assist  Transfers Overall transfer level: Needs assistance Equipment used: Rolling walker (2 wheeled) Transfers: Sit to/from Stand Sit to Stand: Min guard         General transfer comment: v/c's for safe hand placement, increased time, min guard for stability during transition of hands from bed to RW  Ambulation/Gait Ambulation/Gait assistance: Min guard Ambulation Distance (Feet): 120 Feet Assistive device: Rolling walker (2 wheeled) Gait Pattern/deviations: Step-through pattern;Decreased stride length;Decreased weight shift to right;Decreased stance time - right;Narrow base of support Gait velocity: decreased Gait velocity interpretation: Below normal speed for age/gender General Gait Details: pt able to progress from step to, to step through  gait pattern. pt with onset of fatigue requiring 1 standing rest break. pt with no signs of instability  Stairs            Wheelchair Mobility    Modified Rankin (Stroke Patients Only)       Balance Overall balance assessment: No apparent balance deficits (not formally assessed)                                           Pertinent Vitals/Pain Pain Assessment: 0-10 Pain Score: 6  Pain Location: R knee Pain Descriptors / Indicators: Sore Pain Intervention(s): Monitored during session    Home Living Family/patient expects to be discharged to:: Private residence Living Arrangements: Spouse/significant other Available Help at Discharge: Family;Available 24 hours/day Type of Home: House Home Access: Stairs to enter Entrance Stairs-Rails: Right Entrance Stairs-Number of Steps: 5 Home Layout: Able to live on main level with bedroom/bathroom;Two level Home Equipment: Walker - 2 wheels;Bedside commode      Prior Function Level of Independence: Independent               Hand Dominance   Dominant Hand: Right    Extremity/Trunk Assessment   Upper Extremity Assessment Upper Extremity Assessment: Overall WFL for tasks assessed    Lower Extremity Assessment Lower Extremity Assessment: RLE deficits/detail RLE Deficits / Details: able to complete quad set,     Cervical / Trunk Assessment Cervical / Trunk Assessment: Kyphotic  Communication   Communication: No difficulties  Cognition Arousal/Alertness: Awake/alert Behavior During Therapy: WFL for tasks assessed/performed Overall Cognitive Status: Within Functional Limits for  tasks assessed                                        General Comments General comments (skin integrity, edema, etc.): pt with ace wrap on R LE    Exercises Total Joint Exercises Ankle Circles/Pumps: AROM;Both;10 reps;Supine Quad Sets: AROM;Right;10 reps;Supine (with 5 sec hold) Heel Slides:  AAROM;Right;10 reps;Supine   Assessment/Plan    PT Assessment Patient needs continued PT services  PT Problem List Decreased strength;Decreased range of motion;Decreased activity tolerance;Decreased balance;Decreased mobility;Decreased knowledge of use of DME;Decreased safety awareness;Pain       PT Treatment Interventions DME instruction;Gait training;Stair training;Functional mobility training;Therapeutic activities;Therapeutic exercise;Balance training    PT Goals (Current goals can be found in the Care Plan section)  Acute Rehab PT Goals Patient Stated Goal: go home PT Goal Formulation: With patient Time For Goal Achievement: 02/25/17 Potential to Achieve Goals: Good    Frequency 7X/week   Barriers to discharge        Co-evaluation               AM-PAC PT "6 Clicks" Daily Activity  Outcome Measure Difficulty turning over in bed (including adjusting bedclothes, sheets and blankets)?: A Lot Difficulty moving from lying on back to sitting on the side of the bed? : A Lot Difficulty sitting down on and standing up from a chair with arms (e.g., wheelchair, bedside commode, etc,.)?: A Little Help needed moving to and from a bed to chair (including a wheelchair)?: A Little Help needed walking in hospital room?: A Little Help needed climbing 3-5 steps with a railing? : A Lot 6 Click Score: 15    End of Session Equipment Utilized During Treatment: Gait belt Activity Tolerance: Patient tolerated treatment well Patient left: in chair;with call bell/phone within reach;with family/visitor present Nurse Communication: Mobility status PT Visit Diagnosis: Pain;Difficulty in walking, not elsewhere classified (R26.2) Pain - Right/Left: Right Pain - part of body: Knee    Time: 0981-1914 PT Time Calculation (min) (ACUTE ONLY): 36 min   Charges:   PT Evaluation $PT Eval Moderate Complexity: 1 Mod PT Treatments $Gait Training: 8-22 mins   PT G Codes:        Shannon Montgomery, PT, DPT Pager #: [phone] Office #: [phone]   Shannon Montgomery 02/18/2017, 10:00 AM

## 2017-02-19 MED ORDER — HYDROCODONE-ACETAMINOPHEN 5-325 MG PO TABS: 1.0000 | ORAL_TABLET | ORAL | 0 refills | Status: DC | PRN

## 2017-02-19 MED ORDER — METHOCARBAMOL 500 MG PO TABS: 500.0000 mg | ORAL_TABLET | Freq: Four times a day (QID) | ORAL | 0 refills | Status: DC | PRN

## 2017-02-19 MED ORDER — BISACODYL 5 MG PO TBEC: 5.0000 mg | DELAYED_RELEASE_TABLET | Freq: Every day | ORAL | 0 refills | Status: DC | PRN

## 2017-02-19 MED ORDER — ASPIRIN 325 MG PO TBEC: 325.0000 mg | DELAYED_RELEASE_TABLET | Freq: Two times a day (BID) | ORAL | 0 refills | Status: DC

## 2017-02-19 MED ORDER — DOCUSATE SODIUM 100 MG PO CAPS: 100.0000 mg | ORAL_CAPSULE | Freq: Two times a day (BID) | ORAL | 0 refills | Status: DC

## 2017-02-19 NOTE — Discharge Summary (Signed)
Patient ID: Shannon Montgomery MRN: [id number] DOB/AGE: Jul 19, 1934 82 y.o.  Admit date: 02/17/2017 Discharge date: 02/19/2017  Admission Diagnoses:  Principal Problem:   Primary osteoarthritis of right knee   Discharge Diagnoses:  Same  Past Medical History:  Diagnosis Date  . Anxiety   . Arthritis   . Chronic back pain   . Chronic sinusitis   . Degenerative joint disease of knee, right   . Fibromyalgia   . GERD (gastroesophageal reflux disease)   . Osteoarthritis    "full of it" (02/18/2017)  . PMR (polymyalgia rheumatica) (HCC)   . Wears glasses     Surgeries: Procedure(s): TOTAL KNEE ARTHROPLASTY on 02/17/2017   Consultants:   Discharged Condition: Improved  Hospital Course: GRATIA DISLA is an 82 y.o. female who was admitted 02/17/2017 for operative treatment ofPrimary osteoarthritis of right knee. Patient has severe unremitting pain that affects sleep, daily activities, and work/hobbies. After pre-op clearance the patient was taken to the operating room on 02/17/2017 and underwent  Procedure(s): TOTAL KNEE ARTHROPLASTY.    Patient was given perioperative antibiotics: Anti-infectives    Start     Dose/Rate Route Frequency Ordered Stop   02/17/17 1830  ceFAZolin (ANCEF) IVPB 2g/100 mL premix     2 g 200 mL/hr over 30 Minutes Intravenous Every 6 hours 02/17/17 1615 02/18/17 0019   02/17/17 0830  ceFAZolin (ANCEF) IVPB 2g/100 mL premix     2 g 200 mL/hr over 30 Minutes Intravenous On call to O.R. 02/16/17 0930 02/17/17 0900       Patient was given sequential compression devices, early ambulation, and chemoprophylaxis to prevent DVT.  Patient benefited maximally from hospital stay and there were no complications.    Recent vital signs: Patient Vitals for the past 24 hrs:  BP Temp Temp src Pulse Resp SpO2  02/19/17 0450 (!) 159/88 (!) 97.5 F (36.4 C) Oral 96 16 94 %  02/18/17 1942 (!) 144/77 98.8 F (37.1 C) Oral 80 16 97 %  02/18/17 1547 (!) 137/52 98 F (36.7  C) Oral 88 16 97 %     Recent laboratory studies:  Recent Labs  02/17/17 0747  HGB 13.3  HCT 39.0  NA 134*  K 4.4  GLUCOSE 103*     Discharge Medications:   Allergies as of 02/19/2017      Reactions   Other Other (See Comments)   PLEASE GIVE PT NAUSEA MED BEFORE GIVING ANY PAIN MEDICATION - PT CAN BECOME VERY SICK    Ciprofloxacin Nausea Only   Doxycycline Nausea Only   Erythromycin Nausea Only   Hepatitis A Vaccine Swelling      Medication List    STOP taking these medications   celecoxib 200 MG capsule Commonly known as:  CELEBREX     TAKE these medications   acetaminophen 500 MG tablet Commonly known as:  TYLENOL Take 500 mg by mouth every 8 (eight) hours as needed for mild pain or moderate pain.   aspirin 325 MG EC tablet Take 1 tablet (325 mg total) by mouth 2 (two) times daily after a meal. What changed:  medication strength  how much to take  when to take this   bisacodyl 5 MG EC tablet Commonly known as:  DULCOLAX Take 1 tablet (5 mg total) by mouth daily as needed for moderate constipation.   Calcium-Magnesium-Zinc 333-133-5 MG Tabs Take 1 tablet by mouth daily.   Cholecalciferol 2000 units Caps Take 1 capsule by mouth daily.   Coenzyme Q10  100 MG capsule Take 100 mg by mouth 3 (three) times a week.   cyanocobalamin 1000 MCG tablet Take 1 tablet by mouth daily.   docusate sodium 100 MG capsule Commonly known as:  COLACE Take 1 capsule (100 mg total) by mouth 2 (two) times daily.   esomeprazole 20 MG capsule Commonly known as:  NEXIUM Take 20 mg by mouth every morning.   fluorometholone 0.1 % ophthalmic suspension Commonly known as:  FML Place 1 drop into both eyes daily.   HYDROcodone-acetaminophen 5-325 MG tablet Commonly known as:  NORCO/VICODIN Take 1-2 tablets by mouth every 4 (four) hours as needed (breakthrough pain).   KRILL OIL ULTRA STRENGTH PO Take 1 capsule by mouth 2 (two) times a week.   methocarbamol 500 MG  tablet Commonly known as:  ROBAXIN Take 1 tablet (500 mg total) by mouth every 6 (six) hours as needed for muscle spasms.   metoCLOPramide 5 MG tablet Commonly known as:  REGLAN Take 5 mg by mouth See admin instructions. Takes daily before lunch and may take up to three times daily as needed   multivitamin with minerals Tabs tablet Take 1 tablet by mouth every other day.   PRESERVISION AREDS 2 PO Take 1 capsule by mouth daily.   sodium chloride 0.65 % Soln nasal spray Commonly known as:  OCEAN Place 1 spray into both nostrils as needed for congestion.   traMADol 50 MG tablet Commonly known as:  ULTRAM Take 1 tablet by mouth 2 (two) times daily.   TRUNATURE DIGESTIVE PROBIOTIC Caps Take 1 capsule by mouth daily.   vitamin C 500 MG tablet Commonly known as:  ASCORBIC ACID Take 500 mg by mouth daily.            Durable Medical Equipment        Start     Ordered   02/17/17 1616  DME Walker rolling  Once    Question:  Patient needs a walker to treat with the following condition  Answer:  Primary osteoarthritis of right knee   02/17/17 1615   02/17/17 1616  DME 3 n 1  Once     02/17/17 1615   02/17/17 1616  DME Bedside commode  Once    Question:  Patient needs a bedside commode to treat with the following condition  Answer:  Primary osteoarthritis of right knee   02/17/17 1615       Discharge Care Instructions        Start     Ordered   02/19/17 0000  aspirin EC 325 MG EC tablet  2 times daily after meals    Question:  Supervising Provider  Answer:  Marcene Corning   02/19/17 0707   02/19/17 0000  bisacodyl (DULCOLAX) 5 MG EC tablet  Daily PRN    Question:  Supervising Provider  Answer:  Marcene Corning   02/19/17 0707   02/19/17 0000  docusate sodium (COLACE) 100 MG capsule  2 times daily    Question:  Supervising Provider  Answer:  Marcene Corning   02/19/17 0707   02/19/17 0000  HYDROcodone-acetaminophen (NORCO/VICODIN) 5-325 MG tablet  Every 4 hours PRN     Question:  Supervising Provider  Answer:  Marcene Corning   02/19/17 0707   02/19/17 0000  methocarbamol (ROBAXIN) 500 MG tablet  Every 6 hours PRN    Question:  Supervising Provider  Answer:  Marcene Corning   02/19/17 0707   02/19/17 0000  Call MD / Call 911  Comments:  If you experience chest pain or shortness of breath, CALL 911 and be transported to the hospital emergency room.  If you develope a fever above 101 F, pus (white drainage) or increased drainage or redness at the wound, or calf pain, call your surgeon's office.   02/19/17 0707   02/19/17 0000  Diet - low sodium heart healthy     02/19/17 0707   02/19/17 0000  Constipation Prevention    Comments:  Drink plenty of fluids.  Prune juice may be helpful.  You may use a stool softener, such as Colace (over the counter) 100 mg twice a day.  Use MiraLax (over the counter) for constipation as needed.   02/19/17 0707   02/19/17 0000  Increase activity slowly as tolerated     02/19/17 0707   02/19/17 0000  Discharge instructions    Comments:  INSTRUCTIONS AFTER JOINT REPLACEMENT   Remove items at home which could result in a fall. This includes throw rugs or furniture in walking pathways ICE to the affected joint every three hours while awake for 30 minutes at a time, for at least the first 3-5 days, and then as needed for pain and swelling.  Continue to use ice for pain and swelling. You may notice swelling that will progress down to the foot and ankle.  This is normal after surgery.  Elevate your leg when you are not up walking on it.   Continue to use the breathing machine you got in the hospital (incentive spirometer) which will help keep your temperature down.  It is common for your temperature to cycle up and down following surgery, especially at night when you are not up moving around and exerting yourself.  The breathing machine keeps your lungs expanded and your temperature down.   DIET:  As you were doing prior to  hospitalization, we recommend a well-balanced diet.  DRESSING / WOUND CARE / SHOWERING  You may shower 3 days after surgery, but keep the wounds dry during showering.  You may use an occlusive plastic wrap (Press'n Seal for example), NO SOAKING/SUBMERGING IN THE BATHTUB.  If the bandage gets wet, change with a clean dry gauze.  If the incision gets wet, pat the wound dry with a clean towel.  ACTIVITY  Increase activity slowly as tolerated, but follow the weight bearing instructions below.   No driving for 6 weeks or until further direction given by your physician.  You cannot drive while taking narcotics.  No lifting or carrying greater than 10 lbs. until further directed by your surgeon. Avoid periods of inactivity such as sitting longer than an hour when not asleep. This helps prevent blood clots.  You may return to work once you are authorized by your doctor.     WEIGHT BEARING   Weight bearing as tolerated with assist device (walker, cane, etc) as directed, use it as long as suggested by your surgeon or therapist, typically at least 4-6 weeks.   EXERCISES  Results after joint replacement surgery are often greatly improved when you follow the exercise, range of motion and muscle strengthening exercises prescribed by your doctor. Safety measures are also important to protect the joint from further injury. Any time any of these exercises cause you to have increased pain or swelling, decrease what you are doing until you are comfortable again and then slowly increase them. If you have problems or questions, call your caregiver or physical therapist for advice.   Rehabilitation is important following  a joint replacement. After just a few days of immobilization, the muscles of the leg can become weakened and shrink (atrophy).  These exercises are designed to build up the tone and strength of the thigh and leg muscles and to improve motion. Often times heat used for twenty to thirty minutes  before working out will loosen up your tissues and help with improving the range of motion but do not use heat for the first two weeks following surgery (sometimes heat can increase post-operative swelling).   These exercises can be done on a training (exercise) mat, on the floor, on a table or on a bed. Use whatever works the best and is most comfortable for you.    Use music or television while you are exercising so that the exercises are a pleasant break in your day. This will make your life better with the exercises acting as a break in your routine that you can look forward to.   Perform all exercises about fifteen times, three times per day or as directed.  You should exercise both the operative leg and the other leg as well.   Exercises include:   Quad Sets - Tighten up the muscle on the front of the thigh (Quad) and hold for 5-10 seconds.   Straight Leg Raises - With your knee straight (if you were given a brace, keep it on), lift the leg to 60 degrees, hold for 3 seconds, and slowly lower the leg.  Perform this exercise against resistance later as your leg gets stronger.  Leg Slides: Lying on your back, slowly slide your foot toward your buttocks, bending your knee up off the floor (only go as far as is comfortable). Then slowly slide your foot back down until your leg is flat on the floor again.  Angel Wings: Lying on your back spread your legs to the side as far apart as you can without causing discomfort.  Hamstring Strength:  Lying on your back, push your heel against the floor with your leg straight by tightening up the muscles of your buttocks.  Repeat, but this time bend your knee to a comfortable angle, and push your heel against the floor.  You may put a pillow under the heel to make it more comfortable if necessary.   A rehabilitation program following joint replacement surgery can speed recovery and prevent re-injury in the future due to weakened muscles. Contact your doctor or a  physical therapist for more information on knee rehabilitation.    CONSTIPATION  Constipation is defined medically as fewer than three stools per week and severe constipation as less than one stool per week.  Even if you have a regular bowel pattern at home, your normal regimen is likely to be disrupted due to multiple reasons following surgery.  Combination of anesthesia, postoperative narcotics, change in appetite and fluid intake all can affect your bowels.   YOU MUST use at least one of the following options; they are listed in order of increasing strength to get the job done.  They are all available over the counter, and you may need to use some, POSSIBLY even all of these options:    Drink plenty of fluids (prune juice may be helpful) and high fiber foods Colace 100 mg by mouth twice a day  Senokot for constipation as directed and as needed Dulcolax (bisacodyl), take with full glass of water  Miralax (polyethylene glycol) once or twice a day as needed.  If you have tried all these  things and are unable to have a bowel movement in the first 3-4 days after surgery call either your surgeon or your primary doctor.    If you experience loose stools or diarrhea, hold the medications until you stool forms back up.  If your symptoms do not get better within 1 week or if they get worse, check with your doctor.  If you experience "the worst abdominal pain ever" or develop nausea or vomiting, please contact the office immediately for further recommendations for treatment.   ITCHING:  If you experience itching with your medications, try taking only a single pain pill, or even half a pain pill at a time.  You can also use Benadryl over the counter for itching or also to help with sleep.   TED HOSE STOCKINGS:  Use stockings on both legs until for at least 2 weeks or as directed by physician office. They may be removed at night for sleeping.  MEDICATIONS:  See your medication summary on the "After  Visit Summary" that nursing will review with you.  You may have some home medications which will be placed on hold until you complete the course of blood thinner medication.  It is important for you to complete the blood thinner medication as prescribed.  PRECAUTIONS:  If you experience chest pain or shortness of breath - call 911 immediately for transfer to the hospital emergency department.   If you develop a fever greater that 101 F, purulent drainage from wound, increased redness or drainage from wound, foul odor from the wound/dressing, or calf pain - CONTACT YOUR SURGEON.                                                   FOLLOW-UP APPOINTMENTS:  If you do not already have a post-op appointment, please call the office for an appointment to be seen by your surgeon.  Guidelines for how soon to be seen are listed in your "After Visit Summary", but are typically between 1-4 weeks after surgery.  OTHER INSTRUCTIONS:   Knee Replacement:  Do not place pillow under knee, focus on keeping the knee straight while resting. CPM instructions: 0-90 degrees, 2 hours in the morning, 2 hours in the afternoon, and 2 hours in the evening. Place foam block, curve side up under heel at all times except when in CPM or when walking.  DO NOT modify, tear, cut, or change the foam block in any way.  MAKE SURE YOU:  Understand these instructions.  Get help right away if you are not doing well or get worse.    Thank you for letting us be a part of your medical care team.  It is a privilege we respect greatly.  We hope these instructions will help you stay on track for a fast and full recovery!   02/19/17 0707      Diagnostic Studies: No results found.  Disposition: 06-Home-Health Care Svc  Discharge Instructions    Call MD / Call 911    Complete by:  As directed    If you experience chest pain or shortness of breath, CALL 911 and be transported to the hospital emergency room.  If you develope a fever above 101  F, pus (white drainage) or increased drainage or redness at the wound, or calf pain, call your surgeon's office.   Constipation Prevention  Complete by:  As directed    Drink plenty of fluids.  Prune juice may be helpful.  You may use a stool softener, such as Colace (over the counter) 100 mg twice a day.  Use MiraLax (over the counter) for constipation as needed.   Diet - low sodium heart healthy    Complete by:  As directed    Discharge instructions    Complete by:  As directed    INSTRUCTIONS AFTER JOINT REPLACEMENT   Remove items at home which could result in a fall. This includes throw rugs or furniture in walking pathways ICE to the affected joint every three hours while awake for 30 minutes at a time, for at least the first 3-5 days, and then as needed for pain and swelling.  Continue to use ice for pain and swelling. You may notice swelling that will progress down to the foot and ankle.  This is normal after surgery.  Elevate your leg when you are not up walking on it.   Continue to use the breathing machine you got in the hospital (incentive spirometer) which will help keep your temperature down.  It is common for your temperature to cycle up and down following surgery, especially at night when you are not up moving around and exerting yourself.  The breathing machine keeps your lungs expanded and your temperature down.   DIET:  As you were doing prior to hospitalization, we recommend a well-balanced diet.  DRESSING / WOUND CARE / SHOWERING  You may shower 3 days after surgery, but keep the wounds dry during showering.  You may use an occlusive plastic wrap (Press'n Seal for example), NO SOAKING/SUBMERGING IN THE BATHTUB.  If the bandage gets wet, change with a clean dry gauze.  If the incision gets wet, pat the wound dry with a clean towel.  ACTIVITY  Increase activity slowly as tolerated, but follow the weight bearing instructions below.   No driving for 6 weeks or until further  direction given by your physician.  You cannot drive while taking narcotics.  No lifting or carrying greater than 10 lbs. until further directed by your surgeon. Avoid periods of inactivity such as sitting longer than an hour when not asleep. This helps prevent blood clots.  You may return to work once you are authorized by your doctor.     WEIGHT BEARING   Weight bearing as tolerated with assist device (walker, cane, etc) as directed, use it as long as suggested by your surgeon or therapist, typically at least 4-6 weeks.   EXERCISES  Results after joint replacement surgery are often greatly improved when you follow the exercise, range of motion and muscle strengthening exercises prescribed by your doctor. Safety measures are also important to protect the joint from further injury. Any time any of these exercises cause you to have increased pain or swelling, decrease what you are doing until you are comfortable again and then slowly increase them. If you have problems or questions, call your caregiver or physical therapist for advice.   Rehabilitation is important following a joint replacement. After just a few days of immobilization, the muscles of the leg can become weakened and shrink (atrophy).  These exercises are designed to build up the tone and strength of the thigh and leg muscles and to improve motion. Often times heat used for twenty to thirty minutes before working out will loosen up your tissues and help with improving the range of motion but do not use heat  for the first two weeks following surgery (sometimes heat can increase post-operative swelling).   These exercises can be done on a training (exercise) mat, on the floor, on a table or on a bed. Use whatever works the best and is most comfortable for you.    Use music or television while you are exercising so that the exercises are a pleasant break in your day. This will make your life better with the exercises acting as a break in  your routine that you can look forward to.   Perform all exercises about fifteen times, three times per day or as directed.  You should exercise both the operative leg and the other leg as well.   Exercises include:   Quad Sets - Tighten up the muscle on the front of the thigh (Quad) and hold for 5-10 seconds.   Straight Leg Raises - With your knee straight (if you were given a brace, keep it on), lift the leg to 60 degrees, hold for 3 seconds, and slowly lower the leg.  Perform this exercise against resistance later as your leg gets stronger.  Leg Slides: Lying on your back, slowly slide your foot toward your buttocks, bending your knee up off the floor (only go as far as is comfortable). Then slowly slide your foot back down until your leg is flat on the floor again.  Angel Wings: Lying on your back spread your legs to the side as far apart as you can without causing discomfort.  Hamstring Strength:  Lying on your back, push your heel against the floor with your leg straight by tightening up the muscles of your buttocks.  Repeat, but this time bend your knee to a comfortable angle, and push your heel against the floor.  You may put a pillow under the heel to make it more comfortable if necessary.   A rehabilitation program following joint replacement surgery can speed recovery and prevent re-injury in the future due to weakened muscles. Contact your doctor or a physical therapist for more information on knee rehabilitation.    CONSTIPATION  Constipation is defined medically as fewer than three stools per week and severe constipation as less than one stool per week.  Even if you have a regular bowel pattern at home, your normal regimen is likely to be disrupted due to multiple reasons following surgery.  Combination of anesthesia, postoperative narcotics, change in appetite and fluid intake all can affect your bowels.   YOU MUST use at least one of the following options; they are listed in order of  increasing strength to get the job done.  They are all available over the counter, and you may need to use some, POSSIBLY even all of these options:    Drink plenty of fluids (prune juice may be helpful) and high fiber foods Colace 100 mg by mouth twice a day  Senokot for constipation as directed and as needed Dulcolax (bisacodyl), take with full glass of water  Miralax (polyethylene glycol) once or twice a day as needed.  If you have tried all these things and are unable to have a bowel movement in the first 3-4 days after surgery call either your surgeon or your primary doctor.    If you experience loose stools or diarrhea, hold the medications until you stool forms back up.  If your symptoms do not get better within 1 week or if they get worse, check with your doctor.  If you experience "the worst abdominal pain ever" or  develop nausea or vomiting, please contact the office immediately for further recommendations for treatment.   ITCHING:  If you experience itching with your medications, try taking only a single pain pill, or even half a pain pill at a time.  You can also use Benadryl over the counter for itching or also to help with sleep.   TED HOSE STOCKINGS:  Use stockings on both legs until for at least 2 weeks or as directed by physician office. They may be removed at night for sleeping.  MEDICATIONS:  See your medication summary on the "After Visit Summary" that nursing will review with you.  You may have some home medications which will be placed on hold until you complete the course of blood thinner medication.  It is important for you to complete the blood thinner medication as prescribed.  PRECAUTIONS:  If you experience chest pain or shortness of breath - call 911 immediately for transfer to the hospital emergency department.   If you develop a fever greater that 101 F, purulent drainage from wound, increased redness or drainage from wound, foul odor from the wound/dressing, or  calf pain - CONTACT YOUR SURGEON.                                                   FOLLOW-UP APPOINTMENTS:  If you do not already have a post-op appointment, please call the office for an appointment to be seen by your surgeon.  Guidelines for how soon to be seen are listed in your "After Visit Summary", but are typically between 1-4 weeks after surgery.  OTHER INSTRUCTIONS:   Knee Replacement:  Do not place pillow under knee, focus on keeping the knee straight while resting. CPM instructions: 0-90 degrees, 2 hours in the morning, 2 hours in the afternoon, and 2 hours in the evening. Place foam block, curve side up under heel at all times except when in CPM or when walking.  DO NOT modify, tear, cut, or change the foam block in any way.  MAKE SURE YOU:  Understand these instructions.  Get help right away if you are not doing well or get worse.    Thank you for letting us be a part of your medical care team.  It is a privilege we respect greatly.  We hope these instructions will help you stay on track for a fast and full recovery!   Increase activity slowly as tolerated    Complete by:  As directed       Follow-up Information    Marcene Corning, MD. Schedule an appointment as soon as possible for a visit in 2 week(s).   Specialty:  Orthopedic Surgery Contact information: [address] [place] [address] [phone]        [facility] Follow up.   Why:  A representative from [facility] will contact you to arrange start date and time for your therapy. Contact information: [address] [place] [address] [phone]            Signed: Drema Halon 02/19/2017, 7:10 AM

## 2017-02-19 NOTE — Progress Notes (Signed)
Discharge teaching complete. Meds, diet, activity, follow up appointments and incisional care reviewed and all questions answered. Copy of instructions and prescriptions given to patient. Patient discharged home via wheelchair with husband.

## 2017-02-19 NOTE — Progress Notes (Signed)
PT Cancellation Note  Patient Details Name: Shannon Montgomery MRN: [id number] DOB: 10-22-1934   Cancelled Treatment:    Reason Eval/Treat Not Completed: Patient declined, no reason specified (pt declined further therapy at this time stating fatigue and anticipating D/C soon)   Emersyn Kotarski B Tremel Setters 02/19/2017, 11:59 AM  Delaney Meigs, PT [phone]

## 2017-02-19 NOTE — Progress Notes (Signed)
Physical Therapy Treatment Patient Details Name: Shannon Montgomery MRN: [id number] DOB: 1934-06-30 Today's Date: 02/19/2017    History of Present Illness Pt is a 82 yo female admitted 9/25 for elective R TKA. PMH: fibromyalgia, h/o R ankle fx s/p ORIF, PMR.    PT Comments    Pt very pleasant and moving well. Able to perform HEP, stairs and gait without change in pain and pt educated for all as well as progression. Pt safe for return home and will continue to follow acutely.    Follow Up Recommendations  Home health PT;Supervision for mobility/OOB     Equipment Recommendations       Recommendations for Other Services       Precautions / Restrictions Precautions Precautions: Knee Restrictions RLE Weight Bearing: Weight bearing as tolerated    Mobility  Bed Mobility               General bed mobility comments: in chair on arrival  Transfers Overall transfer level: Modified independent                  Ambulation/Gait Ambulation/Gait assistance: Supervision Ambulation Distance (Feet): 150 Feet Assistive device: Rolling walker (2 wheeled) Gait Pattern/deviations: Trunk flexed;Step-through pattern   Gait velocity interpretation: Below normal speed for age/gender General Gait Details: cues for posture and heel strike   Stairs Stairs: Yes   Stair Management: Step to pattern;Sideways;One rail Right Number of Stairs: 4 General stair comments: pt able to perform without LOB or cues  Wheelchair Mobility    Modified Rankin (Stroke Patients Only)       Balance Overall balance assessment: No apparent balance deficits (not formally assessed)                                          Cognition Arousal/Alertness: Awake/alert Behavior During Therapy: WFL for tasks assessed/performed Overall Cognitive Status: Within Functional Limits for tasks assessed                                        Exercises Total Joint  Exercises Quad Sets: AROM;Right;Seated;5 reps Hip ABduction/ADduction: AROM;Right;Seated;10 reps Straight Leg Raises: AROM;Right;Seated;10 reps Long Arc Quad: AROM;Right;Seated;10 reps Goniometric ROM: 4-90 Marching in Standing: AROM;Right;Seated;10 reps    General Comments        Pertinent Vitals/Pain Pain Score: 2  Pain Location: R knee Pain Descriptors / Indicators: Sore;Tightness Pain Intervention(s): Limited activity within patient's tolerance;Premedicated before session;Monitored during session;Repositioned    Home Living                      Prior Function            PT Goals (current goals can now be found in the care plan section) Progress towards PT goals: Progressing toward goals    Frequency           PT Plan Current plan remains appropriate    Co-evaluation              AM-PAC PT "6 Clicks" Daily Activity  Outcome Measure  Difficulty turning over in bed (including adjusting bedclothes, sheets and blankets)?: A Little Difficulty moving from lying on back to sitting on the side of the bed? : A Little Difficulty sitting down on and standing up from a chair  with arms (e.g., wheelchair, bedside commode, etc,.)?: A Little Help needed moving to and from a bed to chair (including a wheelchair)?: None Help needed walking in hospital room?: A Little Help needed climbing 3-5 steps with a railing? : None 6 Click Score: 20    End of Session Equipment Utilized During Treatment: Gait belt Activity Tolerance: Patient tolerated treatment well Patient left: in chair;with call bell/phone within reach Nurse Communication: Mobility status PT Visit Diagnosis: Difficulty in walking, not elsewhere classified (R26.2);Pain Pain - Right/Left: Right Pain - part of body: Knee     Time: 1610-9604 PT Time Calculation (min) (ACUTE ONLY): 35 min  Charges:  $Gait Training: 8-22 mins $Therapeutic Exercise: 8-22 mins                    G Codes:        Delaney Meigs, PT [phone]   Rody Keadle B Croy Drumwright 02/19/2017, 8:48 AM

## 2017-02-19 NOTE — Evaluation (Addendum)
Occupational Therapy Evaluation and Discharge Patient Details Name: Shannon Montgomery MRN: [id number] DOB: [birth year] Today's Date: 02/19/2017    History of Present Illness Pt is a 82 yo female admitted 9/25 for elective R TKA. PMH: fibromyalgia, h/o R ankle fx s/p ORIF, PMR.   Clinical Impression   Pt reports she was independent with ADL PTA. Currently pt requires supervision for ADL and functional mobility with the exception of min assist for LB ADL. All knee, safety, and ADL education completed with pt and husband. Pt planning to d/c home with 24/7 supervision from her husband. No further acute OT needs identified; signing off at this time. Please re-consult if needs change. Thank you for this referral.    Follow Up Recommendations  No OT follow up    Equipment Recommendations  None recommended by OT    Recommendations for Other Services       Precautions / Restrictions Precautions Precautions: Knee Precaution Booklet Issued: No Precaution Comments: Educated on no pillow under knee and use of KI when sleeping to keep knee in extension Restrictions Weight Bearing Restrictions: Yes RLE Weight Bearing: Weight bearing as tolerated      Mobility Bed Mobility               General bed mobility comments: Pt OOB in chair upon arrival.  Transfers Overall transfer level: Needs assistance Equipment used: Rolling walker (2 wheeled) Transfers: Sit to/from Stand Sit to Stand: Supervision         General transfer comment: for safety. good hand placement and technique    Balance Overall balance assessment: No apparent balance deficits (not formally assessed)                                         ADL either performed or assessed with clinical judgement   ADL Overall ADL's : Needs assistance/impaired Eating/Feeding: Set up;Sitting   Grooming: Supervision/safety;Standing   Upper Body Bathing: Set up;Supervision/ safety;Sitting   Lower Body Bathing:  Minimal assistance;Sit to/from stand   Upper Body Dressing : Set up;Supervision/safety;Sitting   Lower Body Dressing: Minimal assistance;Sit to/from stand Lower Body Dressing Details (indicate cue type and reason): Educated pt on RLE into clothing first, husband to assist as needed Toilet Transfer: Supervision/safety;Ambulation;BSC;RW Toilet Transfer Details (indicate cue type and reason): Simulated by sit to stand from chair with functional mobility in room. Educated on use of 3 in 1 over toilet       Tub/Shower Transfer Details (indicate cue type and reason): Educated pt and husband on walk in shower transfer technique and use of 3 in 1 in shower as a seat Functional mobility during ADLs: Supervision/safety;Rolling walker       Vision         Perception     Praxis      Pertinent Vitals/Pain Pain Assessment: Faces Pain Score: 2  Faces Pain Scale: Hurts a little bit Pain Location: R knee with mobility Pain Descriptors / Indicators: Sore Pain Intervention(s): Monitored during session     Hand Dominance Right   Extremity/Trunk Assessment Upper Extremity Assessment Upper Extremity Assessment: Overall WFL for tasks assessed   Lower Extremity Assessment Lower Extremity Assessment: Defer to PT evaluation   Cervical / Trunk Assessment Cervical / Trunk Assessment: Kyphotic   Communication Communication Communication: No difficulties   Cognition Arousal/Alertness: Awake/alert Behavior During Therapy: WFL for tasks assessed/performed Overall Cognitive Status: Within Functional  Limits for tasks assessed                                     General Comments       Exercises    Shoulder Instructions      Home Living Family/patient expects to be discharged to:: Private residence Living Arrangements: Spouse/significant other Available Help at Discharge: Family;Available 24 hours/day Type of Home: House Home Access: Stairs to enter [employer] of Steps: 5 Entrance Stairs-Rails: Right Home Layout: Able to live on main level with bedroom/bathroom;Two level     Bathroom Shower/Tub: Producer, television/film/video: Standard     Home Equipment: Environmental consultant - 2 wheels;Bedside commode;Grab bars - tub/shower          Prior Functioning/Environment Level of Independence: Independent                 OT Problem List:        OT Treatment/Interventions:      OT Goals(Current goals can be found in the care plan section) Acute Rehab OT Goals Patient Stated Goal: go home OT Goal Formulation: All assessment and education complete, DC therapy  OT Frequency:     Barriers to D/C:            Co-evaluation              AM-PAC PT "6 Clicks" Daily Activity     Outcome Measure Help from another person eating meals?: None Help from another person taking care of personal grooming?: A Little Help from another person toileting, which includes using toliet, bedpan, or urinal?: A Little Help from another person bathing (including washing, rinsing, drying)?: A Little Help from another person to put on and taking off regular upper body clothing?: None Help from another person to put on and taking off regular lower body clothing?: A Little 6 Click Score: 20   End of Session Equipment Utilized During Treatment: Rolling walker CPM Right Knee CPM Right Knee: Off  Activity Tolerance: Patient tolerated treatment well Patient left: in chair;with call bell/phone within reach;with family/visitor present  OT Visit Diagnosis: Other abnormalities of gait and mobility (R26.89)                Time: 1610-9604 OT Time Calculation (min): 20 min Charges:  OT General Charges $OT Visit: 1 Visit OT Evaluation $OT Eval Moderate Complexity: 1 Mod G-Codes:     Rewa Weissberg A. Brett Albino, M.S., OTR/L Pager: [phone]  Gaye Alken 02/19/2017, 9:24 AM

## 2017-03-20 IMAGING — CT CT IMAGE GUIDED DRAINAGE BY PERCUTANEOUS CATHETER
1 of 3 series · 13 of 32 positions shown, 19 images · non-contrast
Comparison: none

CLINICAL DATA: Right-sided retroperitoneal abscess extending
superiorly to abut the liver. The patient presents for percutaneous
drainage.

[Series 2: i-spiral 5.0 b40f · axial · 0.94mm/px · z∈[+1049,+1196]mm · 13 of 50 slices shown, 19 images]
[im 4/50  soft-tissue]
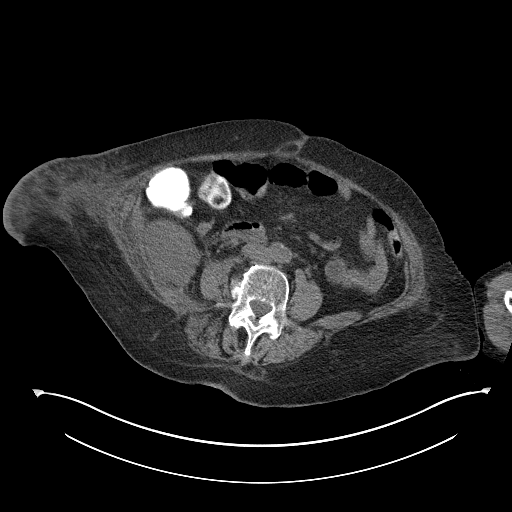
[im 4/50  bone]
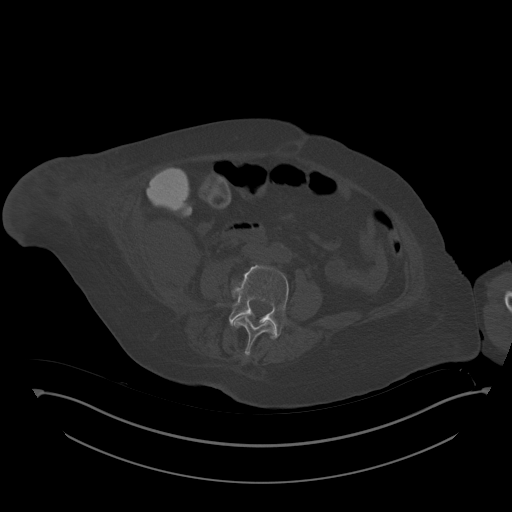
[im 7/50  soft-tissue]
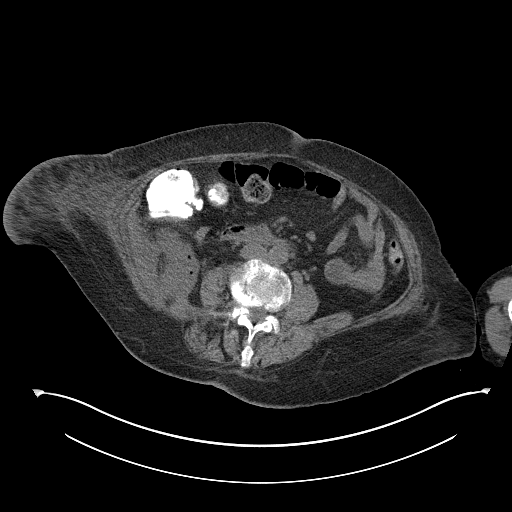
[im 10/50  soft-tissue]
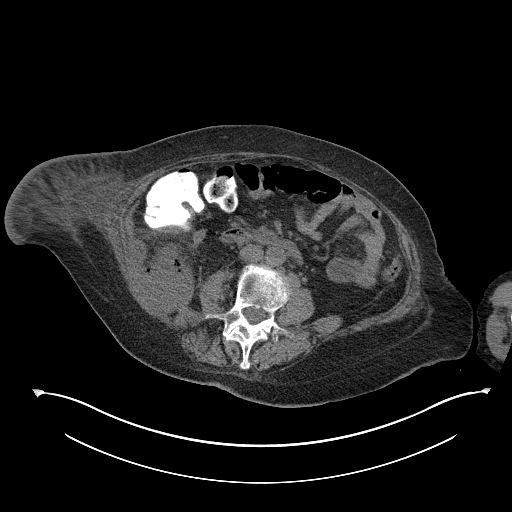
[im 14/50  soft-tissue]
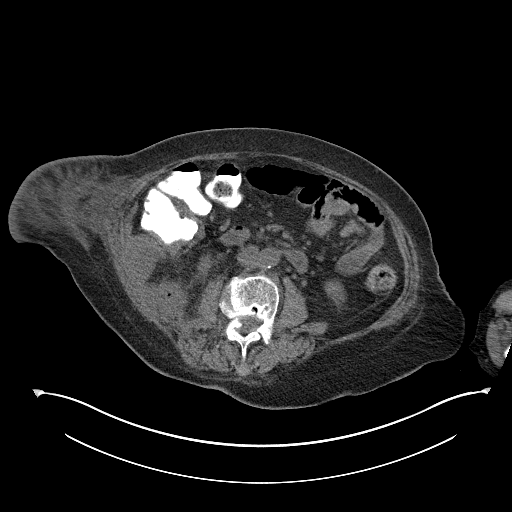
[im 17/50  soft-tissue]
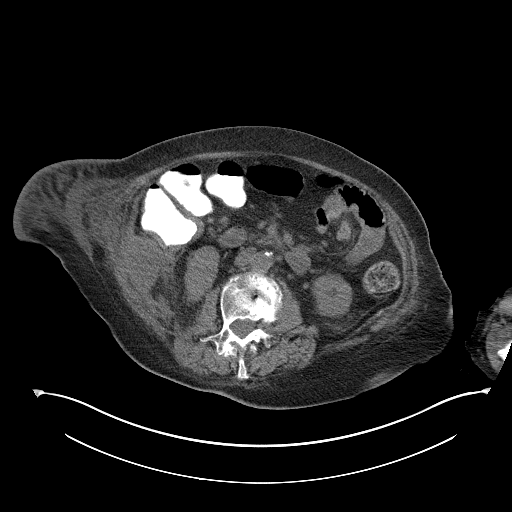
[im 20/50  soft-tissue]
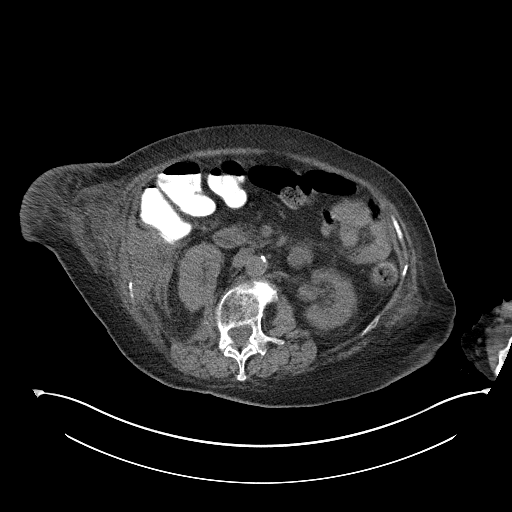
[im 27/50  soft-tissue]
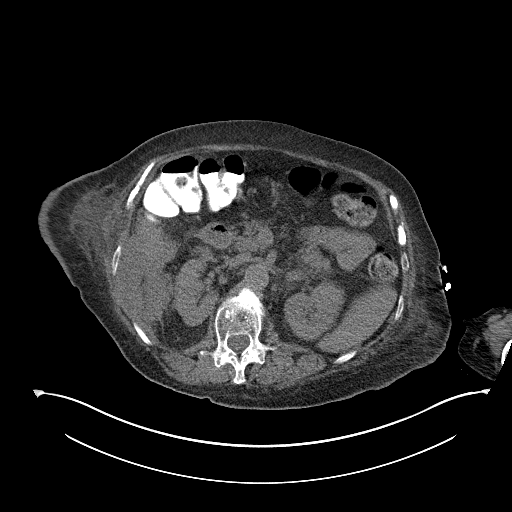
[im 30/50  soft-tissue]
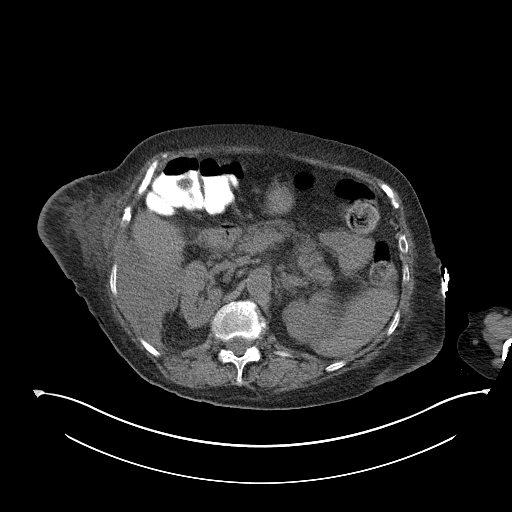
[im 33/50  soft-tissue]
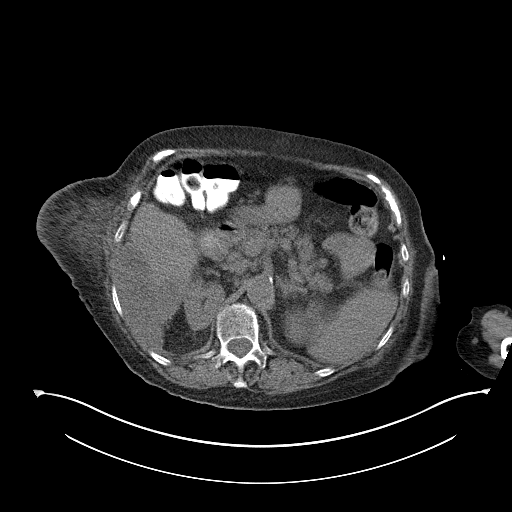
[im 33/50  bone]
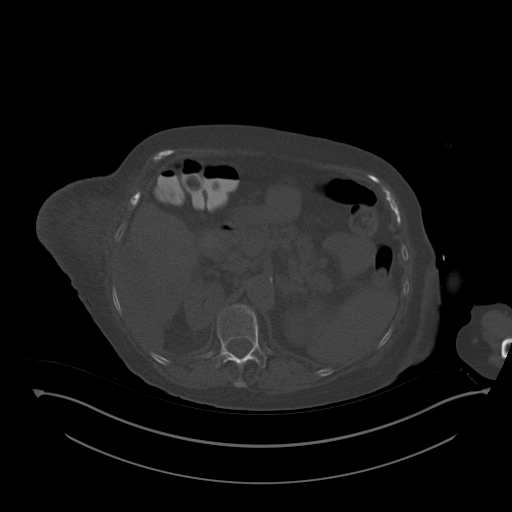
[im 36/50  soft-tissue]
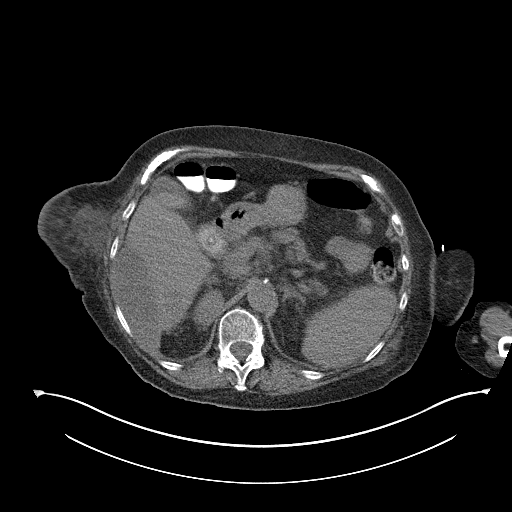
[im 36/50  lung]
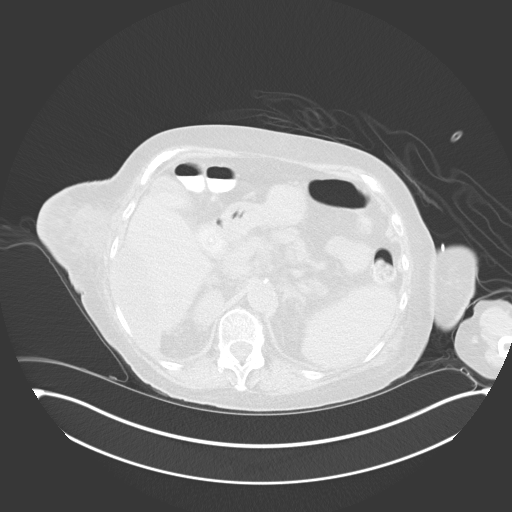
[im 40/50  soft-tissue]
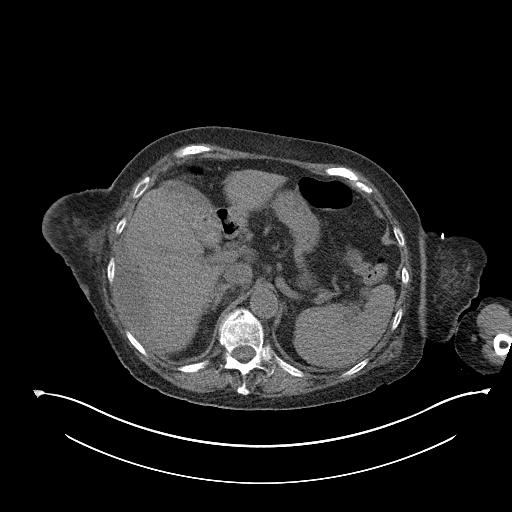
[im 40/50  lung]
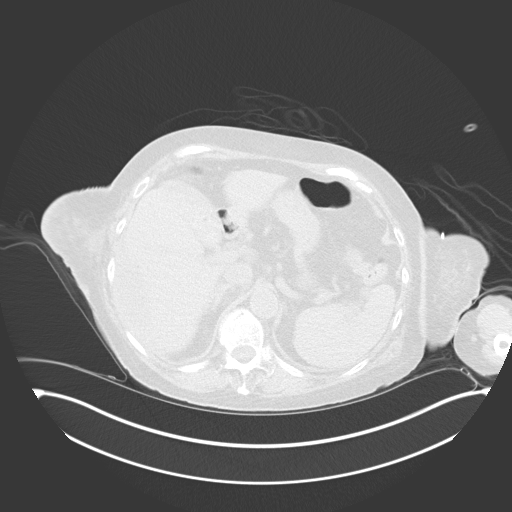
[im 43/50  soft-tissue]
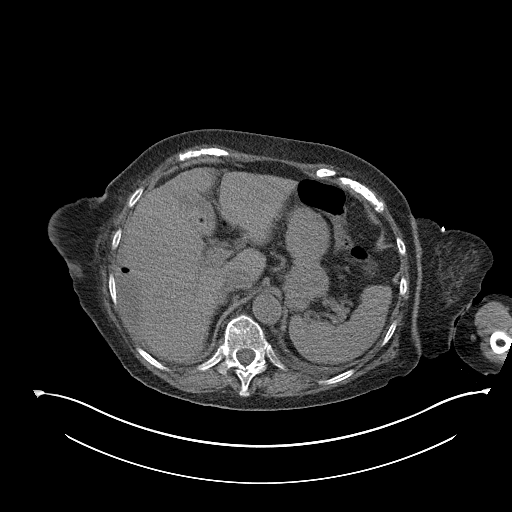
[im 43/50  lung]
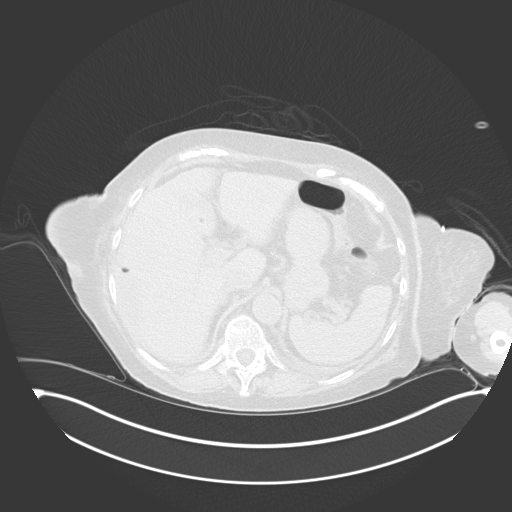
[im 46/50  soft-tissue]
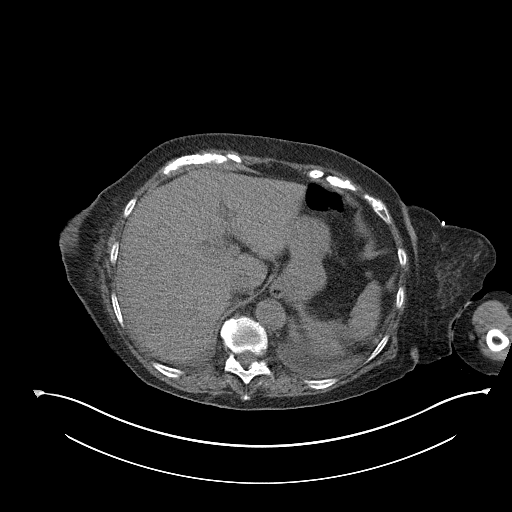
[im 46/50  lung]
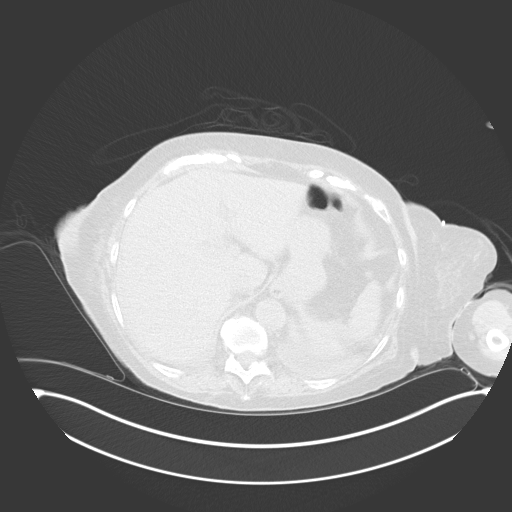

[13 of 32 positions shown; findings below may reference images not displayed]

EXAM:
CT GUIDED DRAINAGE OF RETROPERITONEAL ABSCESS

ANESTHESIA/SEDATION:
1.0 Mg IV Versed 50 mcg IV Fentanyl

Total Moderate Sedation Time:  26 minutes

The patient's level of consciousness and physiologic status were
continuously monitored during the procedure by Radiology nursing.

PROCEDURE:
The procedure, risks, benefits, and alternatives were explained to
the patient. Questions regarding the procedure were encouraged and
answered. The patient understands and consents to the procedure. A
time-out was performed prior to initiating the procedure.

The right abdominal wall was prepped with chlorhexidine in a sterile
fashion, and a sterile drape was applied covering the operative
field. A sterile gown and sterile gloves were used for the
procedure. Local anesthesia was provided with 1% Lidocaine.

Imaging through the abdomen was performed in a supine position.
Under CT guidance, an 18 gauge needle was advanced into a
perihepatic abscess. A guidewire was advanced through the needle.
The percutaneous tract was dilated and a 12 French pigtail drainage
catheter placed. Fluid aspiration was performed with a sample sent
for culture analysis. The catheter was flushed and connected to a
suction bulb. The catheter was secured at skin with a Prolene
retention suture and adhesive StatLock device.

COMPLICATIONS:
None
FINDINGS: The lower perihepatic component of an elongated and predominantly
retroperitoneal abscess yielded purulent fluid. After drainage
catheter placement, there is good return of purulent fluid.
IMPRESSION: CT-guided percutaneous drainage of abscess adjacent to the inferior
liver. Grossly purulent fluid return noted with a sample sent for
culture analysis. A 12 French drain was placed and attached to
suction bulb drainage.

## 2017-04-07 IMAGING — CT CT IMAGE GUIDED DRAINAGE BY PERCUTANEOUS CATHETER
1 of 2 series · 14 of 32 positions shown, 18 images · non-contrast
Comparison: none

CLINICAL DATA: Status post percutaneous drainage of abscess lateral
to the inferior liver and extending into the retroperitoneum on
01/21/2016. Followup CT yesterday revealed diminished fluid around
the drainage catheter and enlarging undrained component of abscess
in the right retroperitoneum. She now presents for additional
drainage catheter placement.

[Series 2: i-spiral 5.0 b40f · axial · 0.97mm/px · z∈[+814,+975]mm · 14 of 52 slices shown, 18 images]
[im 3/52  soft-tissue]
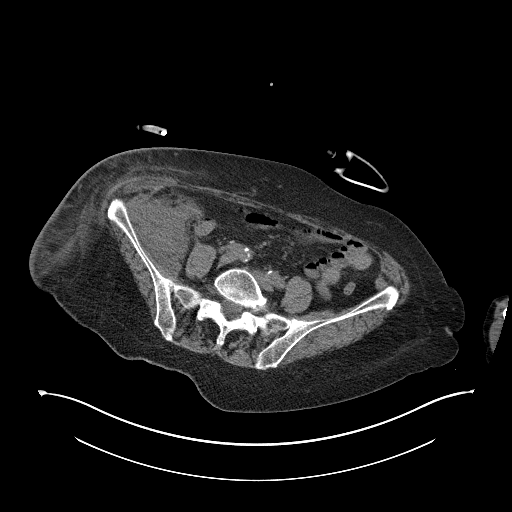
[im 3/52  bone]
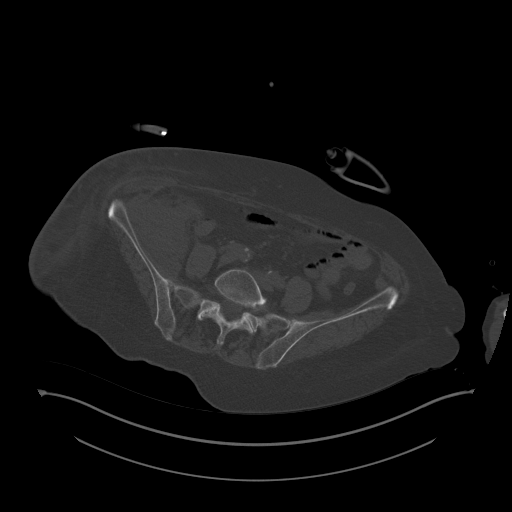
[im 7/52  soft-tissue]
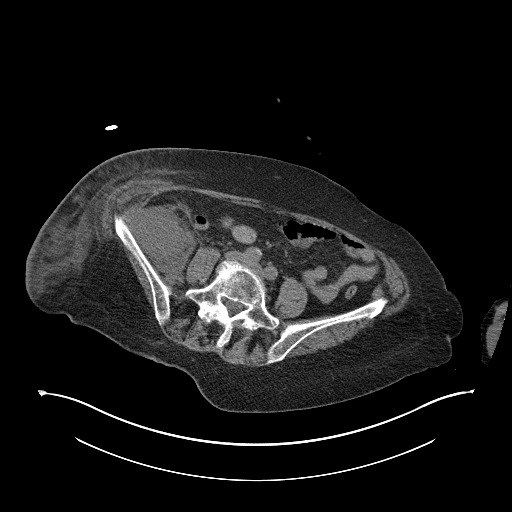
[im 11/52  soft-tissue]
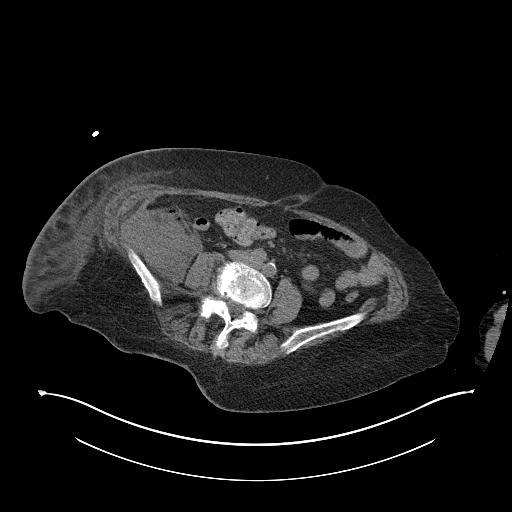
[im 15/52  soft-tissue]
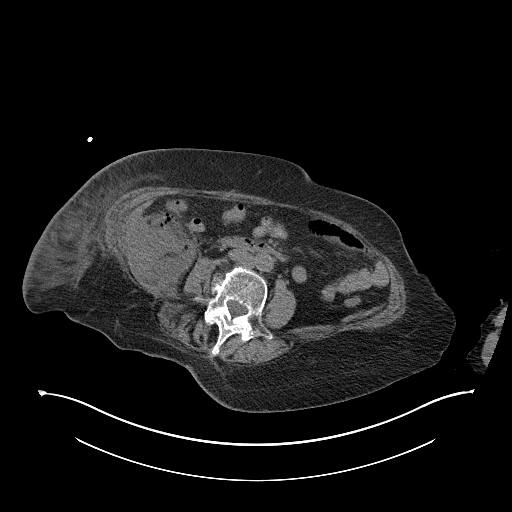
[im 20/52  soft-tissue]
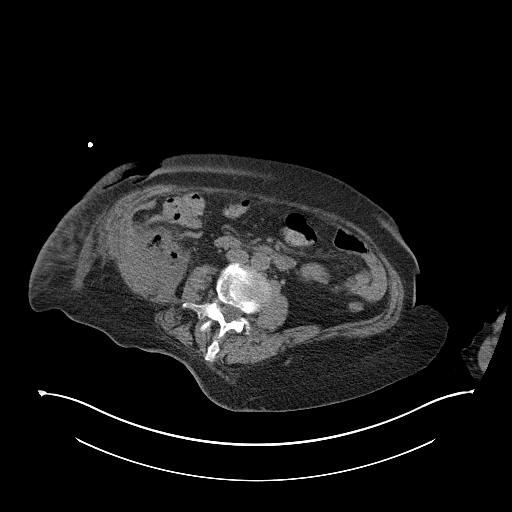
[im 24/52  soft-tissue]
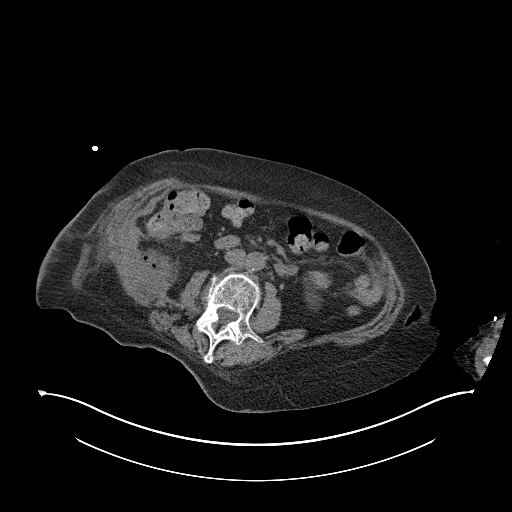
[im 28/52  soft-tissue]
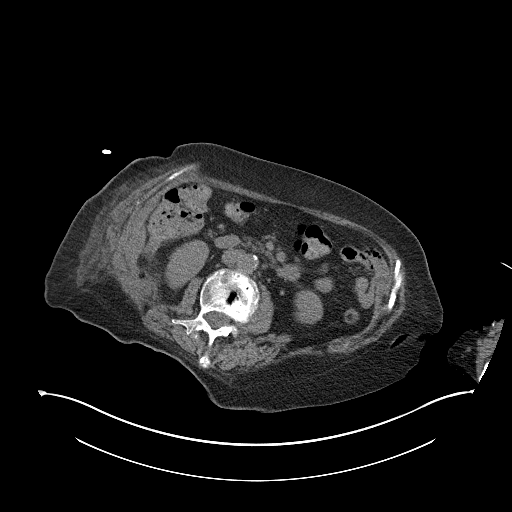
[im 32/52  soft-tissue]
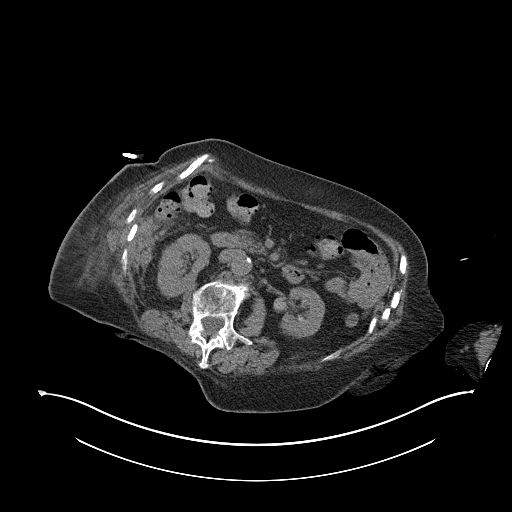
[im 37/52  soft-tissue]
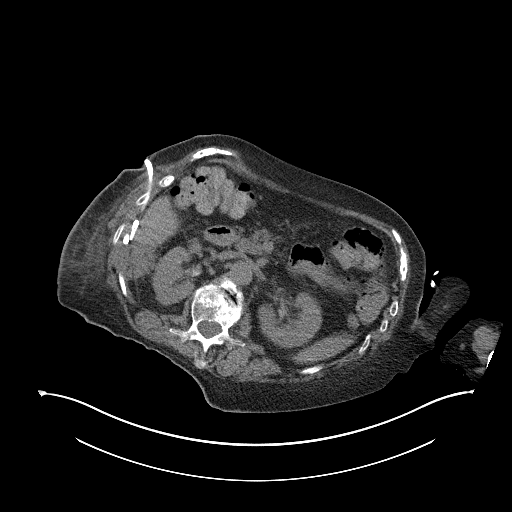
[im 37/52  bone]
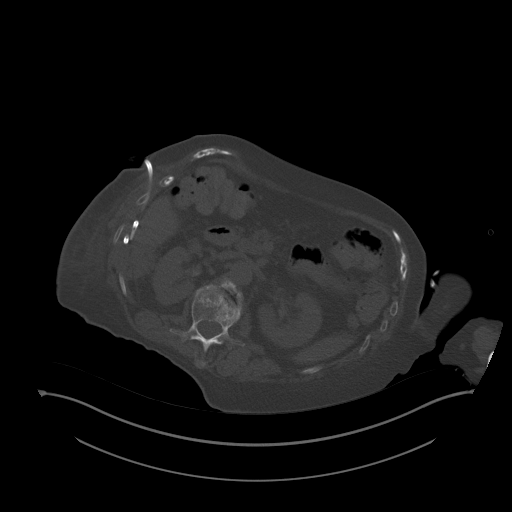
[im 41/52  soft-tissue]
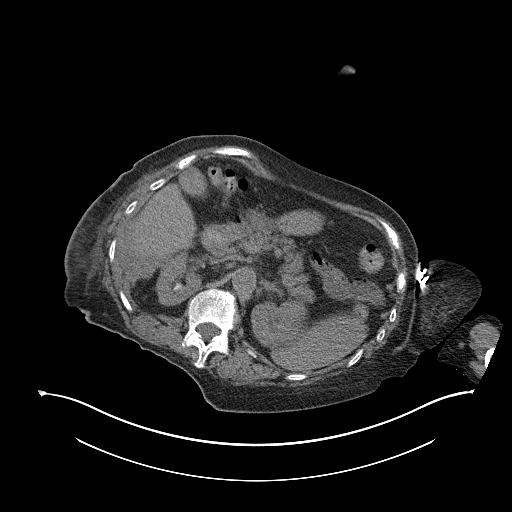
[im 43/52  lung]
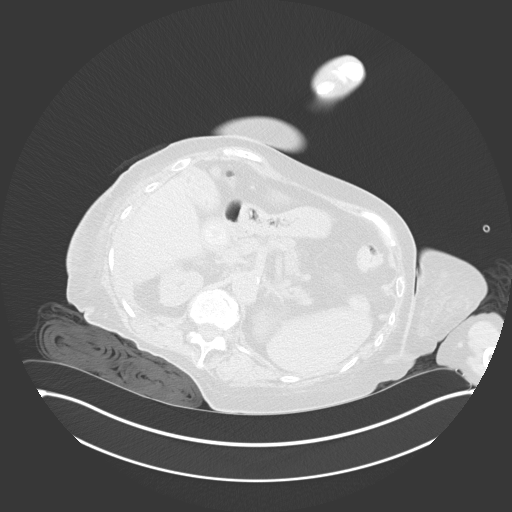
[im 45/52  soft-tissue]
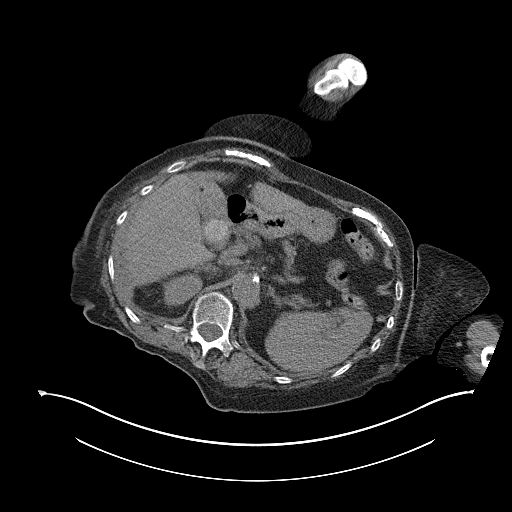
[im 45/52  lung]
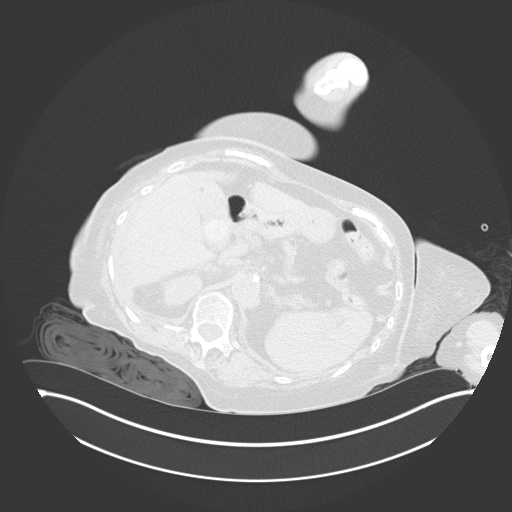
[im 47/52  lung]
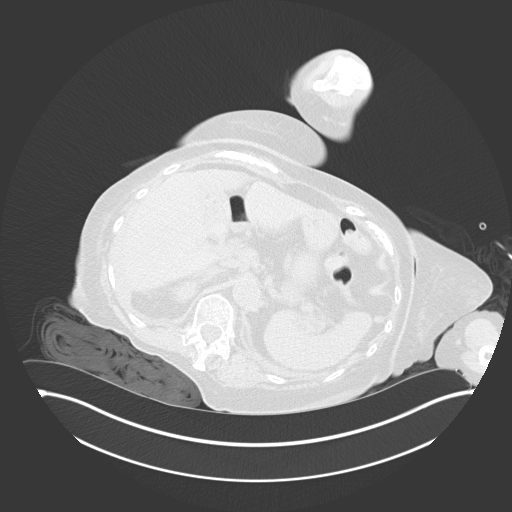
[im 49/52  soft-tissue]
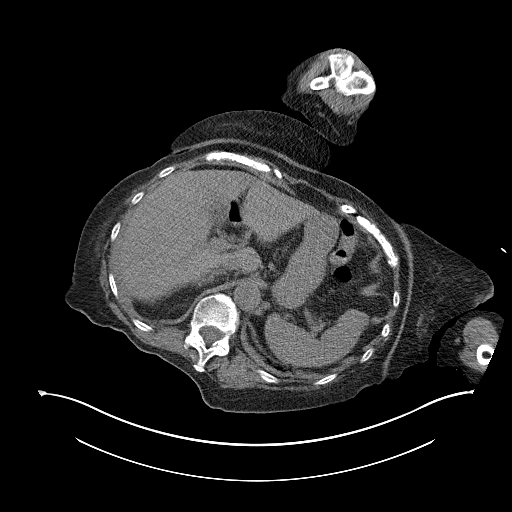
[im 49/52  lung]
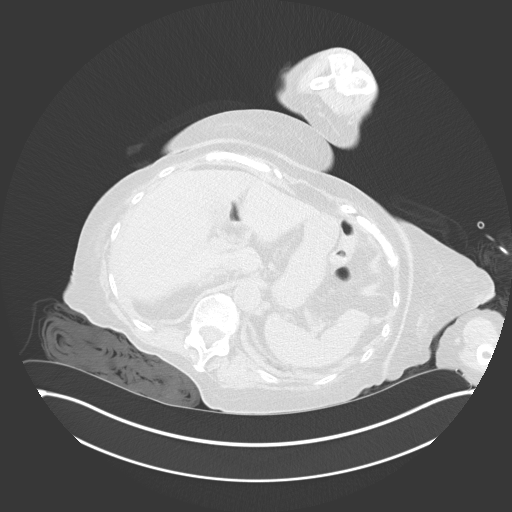

[14 of 32 positions shown; findings below may reference images not displayed]

EXAM:
CT GUIDED CATHETER DRAINAGE OF RIGHT RETROPERITONEAL ABSCESS

ANESTHESIA/SEDATION:
1.0 mg IV Versed 37.5 mcg IV Fentanyl

Total Moderate Sedation Time:  25 minutes

The patient's level of consciousness and physiologic status were
continuously monitored during the procedure by Radiology nursing.

PROCEDURE:
The procedure, risks, benefits, and alternatives were explained to
the patient. Questions regarding the procedure were encouraged and
answered. The patient understands and consents to the procedure. A
time out was performed prior to initiating the procedure.

CT was performed in a supine position with the right side rolled up
slightly. The right abdominal wall was prepped with chlorhexidine in
a sterile fashion, and a sterile drape was applied covering the
operative field. A sterile gown and sterile gloves were used for the
procedure. Local anesthesia was provided with 1% Lidocaine.

Under CT guidance, an 18 gauge trocar needle was advanced into a
right retroperitoneal abscess. Fluid was aspirated and a sample sent
for culture analysis. A guidewire was advanced through the needle.
The percutaneous tract was dilated a 12 French pigtail drainage
catheter advanced. Catheter positioning was confirmed by CT after
placement.

The catheter was flushed and connected to suction bulb drainage. It
was secured at the skin with a Prolene retention suture and adhesive
StatLock device. A new StatLock device was also applied to the skin
at the exit site of the previously placed drain.

COMPLICATIONS:
None
FINDINGS: Large retroperitoneal abscess is present lateral to the psoas muscle
and extending into the right iliac fossa and inferior right pelvis.
Drain placement was performed just below the iliac crest. There was
immediate return of grossly purulent fluid. A sample was sent for
culture analysis.
IMPRESSION: CT-guided drainage of residual right-sided retroperitoneal abscess
in the pelvis. A 12 French drain was placed and attached to suction
bulb drainage.

## 2018-07-31 ENCOUNTER — Emergency Department (HOSPITAL_COMMUNITY): Payer: Medicare Other

## 2018-07-31 ENCOUNTER — Other Ambulatory Visit: Payer: Self-pay

## 2018-07-31 ENCOUNTER — Encounter (HOSPITAL_COMMUNITY): Payer: Self-pay

## 2018-07-31 ENCOUNTER — Emergency Department (HOSPITAL_COMMUNITY)
Admission: EM | Admit: 2018-07-31 | Discharge: 2018-07-31 | Disposition: A | Discharge status: Home / Self Care | Payer: Medicare Other | Attending: Emergency Medicine | Admitting: Emergency Medicine

## 2018-07-31 DIAGNOSIS — Z96651 Presence of right artificial knee joint: Secondary | ICD-10-CM | POA: Insufficient documentation

## 2018-07-31 DIAGNOSIS — Z7982 Long term (current) use of aspirin: Secondary | ICD-10-CM | POA: Diagnosis not present

## 2018-07-31 DIAGNOSIS — R2681 Unsteadiness on feet: Secondary | ICD-10-CM

## 2018-07-31 DIAGNOSIS — R531 Weakness: Secondary | ICD-10-CM

## 2018-07-31 DIAGNOSIS — R42 Dizziness and giddiness: Secondary | ICD-10-CM

## 2018-07-31 DIAGNOSIS — N3000 Acute cystitis without hematuria: Secondary | ICD-10-CM

## 2018-07-31 DIAGNOSIS — I10 Essential (primary) hypertension: Secondary | ICD-10-CM | POA: Insufficient documentation

## 2018-07-31 DIAGNOSIS — Z79899 Other long term (current) drug therapy: Secondary | ICD-10-CM | POA: Insufficient documentation

## 2018-07-31 LAB — HEPATIC FUNCTION PANEL
ALBUMIN: 3.7 g/dL (ref 3.5–5.0)
ALT: 12 U/L (ref 0–44)
AST: 20 U/L (ref 15–41)
Alkaline Phosphatase: 79 U/L (ref 38–126)
BILIRUBIN DIRECT: 0.1 mg/dL (ref 0.0–0.2)
Indirect Bilirubin: 0.3 mg/dL (ref 0.3–0.9)
Total Bilirubin: 0.4 mg/dL (ref 0.3–1.2)
Total Protein: 6 g/dL — ABNORMAL LOW (ref 6.5–8.1)

## 2018-07-31 LAB — I-STAT TROPONIN, ED: Troponin i, poc: 0 ng/mL (ref 0.00–0.08)

## 2018-07-31 LAB — URINALYSIS, ROUTINE W REFLEX MICROSCOPIC
Bilirubin Urine: NEGATIVE
GLUCOSE, UA: NEGATIVE mg/dL
Ketones, ur: 5 mg/dL — AB
Nitrite: POSITIVE — AB
PH: 5 (ref 5.0–8.0)
Protein, ur: NEGATIVE mg/dL
Specific Gravity, Urine: 1.016 (ref 1.005–1.030)

## 2018-07-31 LAB — PROTIME-INR
INR: 0.9 (ref 0.8–1.2)
PROTHROMBIN TIME: 12.1 s (ref 11.4–15.2)

## 2018-07-31 LAB — CBC
HCT: 37.7 % (ref 36.0–46.0)
Hemoglobin: 12 g/dL (ref 12.0–15.0)
MCH: 28.2 pg (ref 26.0–34.0)
MCHC: 31.8 g/dL (ref 30.0–36.0)
MCV: 88.7 fL (ref 80.0–100.0)
PLATELETS: 271 10*3/uL (ref 150–400)
RBC: 4.25 MIL/uL (ref 3.87–5.11)
RDW: 13 % (ref 11.5–15.5)
WBC: 4.8 10*3/uL (ref 4.0–10.5)
nRBC: 0 % (ref 0.0–0.2)

## 2018-07-31 LAB — DIFFERENTIAL
BASOS ABS: 0 10*3/uL (ref 0.0–0.1)
Basophils Relative: 1 %
EOS ABS: 0.1 10*3/uL (ref 0.0–0.5)
EOS PCT: 1 %
LYMPHS ABS: 0.9 10*3/uL (ref 0.7–4.0)
LYMPHS PCT: 19 %
Monocytes Absolute: 0.4 10*3/uL (ref 0.1–1.0)
Monocytes Relative: 8 %
NEUTROS PCT: 72 %
Neutro Abs: 3.5 10*3/uL (ref 1.7–7.7)

## 2018-07-31 LAB — BASIC METABOLIC PANEL
Anion gap: 9 (ref 5–15)
BUN: 12 mg/dL (ref 8–23)
CHLORIDE: 97 mmol/L — AB (ref 98–111)
CO2: 24 mmol/L (ref 22–32)
Calcium: 8.5 mg/dL — ABNORMAL LOW (ref 8.9–10.3)
Creatinine, Ser: 0.89 mg/dL (ref 0.44–1.00)
GFR calc non Af Amer: >60 mL/min (ref >60)
Glucose, Bld: 124 mg/dL — ABNORMAL HIGH (ref 70–99)
Potassium: 3.7 mmol/L (ref 3.5–5.1)
SODIUM: 130 mmol/L — AB (ref 135–145)

## 2018-07-31 LAB — APTT: APTT: 20 s — AB (ref 24–36)

## 2018-07-31 LAB — CBG MONITORING, ED: Glucose-Capillary: 99 mg/dL (ref 70–99)

## 2018-07-31 LAB — CK: CK TOTAL: 63 U/L (ref 38–234)

## 2018-07-31 MED ORDER — LORAZEPAM 1 MG PO TABS
1.0000 mg | ORAL_TABLET | Freq: Once | ORAL | Status: AC | PRN
  Administered 2018-07-31: 1 mg via ORAL
  Filled 2018-07-31: qty 1

## 2018-07-31 MED ORDER — LORAZEPAM 2 MG/ML IJ SOLN: 0.5000 mg | Freq: Once | INTRAMUSCULAR | Status: DC

## 2018-07-31 MED ORDER — SODIUM CHLORIDE 0.9 % IV SOLN
1.0000 g | Freq: Once | INTRAVENOUS | Status: AC
  Administered 2018-07-31: 1 g via INTRAVENOUS
  Filled 2018-07-31: qty 10

## 2018-07-31 MED ORDER — CEPHALEXIN 500 MG PO CAPS: 1000.0000 mg | ORAL_CAPSULE | Freq: Two times a day (BID) | ORAL | 0 refills | Status: DC

## 2018-07-31 NOTE — Discharge Instructions (Signed)
1.  Start your prescribed antibiotics tomorrow evening.  You have been given a dose of antibiotics in the emergency department that is effective for 24 hours. 2.  Return to the emergency department if you have worsening or concerning symptoms. 3.  A urine culture has been obtained.  Results should be available in 24 to 48 hours.

## 2018-07-31 NOTE — ED Notes (Signed)
Urine Culture has been sent to lab for hold until order is needed.

## 2018-07-31 NOTE — ED Triage Notes (Signed)
Patient arrived via [facility] from home. Patient is AOx4 and ambulatory. Patient husband called [phone] due to feeling weak and lightheaded after lunch today. Patient currently has no pain and no complaints at this time. Husband is in route to ED.

## 2018-07-31 NOTE — ED Notes (Signed)
RN has ambulated patient to restroom without complication / issue with gait or balance.

## 2018-07-31 NOTE — ED Notes (Signed)
Secretary informed of patient needing MRI.

## 2018-07-31 NOTE — ED Notes (Signed)
Pt unable to produce urine at this moment.

## 2018-07-31 NOTE — ED Notes (Signed)
Bladder scan-107ml

## 2018-07-31 NOTE — ED Notes (Signed)
Patient transported to MRI 

## 2018-07-31 NOTE — ED Notes (Signed)
Bed: WA06 Expected date:  Expected time:  Means of arrival:  Comments: 84 yo weakness 

## 2018-07-31 NOTE — ED Provider Notes (Signed)
[facility]-EMERGENCY DEPT Provider Note   CSN: [id number] Arrival date & time: 07/31/18  1455    History   Chief Complaint Chief Complaint  Patient presents with  . Fatigue  . Weakness    HPI Shannon Montgomery is a 84 y.o. female.     HPI Patient is presenting due to concern for possible stroke.  She was eating lunch with her husband at about noon.  She reports that she did have some canned mushrooms that she had sauted and wondered if they could have caused the symptom or not.  She reports that she quite quickly became dizzy feeling and off balance.  She reports in conjunction with that her speech seemed thick and heavy although she could get the words out.  Her husband assisted her to the bedroom and after several minutes they determined to call EMS.  Patient denies she had a headache.  She denies she had any acute visual changes.  She denies she noted focal weakness numbness or tingling of an extremity.  She did not get nausea or vomiting with this.  She reports his symptoms persisted until she was being brought to the emergency department at which time the seem to abate all except for feeling still a little fatigued generally.  She now feels like her speech is normal and she does not feel off balance. Patient gives a lot of ancillary history over the past number of weeks.  She reports she has not really quite felt herself since she had her knee arthroscopy 5 weeks ago.  1 of the overriding symptoms has been a general feeling of anxiety or not feeling well along with some mild headaches and fatigue.  She reports 2 weeks ago her PCP did all lab work and they did not find anything wrong but decided to try some Prozac for suspected anxiety symptoms.  Patient reports that she took that for about 2 days and felt worse than ever thus it was discontinued.  She is continued to deal with a bit of a sense of anxiety.  She does have Ativan to take as needed which she reports she  takes very infrequently and in a low dose.  She does report that last night she felt more agitated than usual at about midnight.  She took 0.5 mg dose and slept all right and then got up this morning feeling a little drowsy but pretty well.  She had her usual coffee and graham crackers.  She felt fine until this episode described earlier after preparing lunch with her husband. Past Medical History:  Diagnosis Date  . Anxiety   . Arthritis   . Chronic back pain   . Chronic sinusitis   . Degenerative joint disease of knee, right   . Fibromyalgia   . GERD (gastroesophageal reflux disease)   . Osteoarthritis    "full of it" (02/18/2017)  . PMR (polymyalgia rheumatica) (HCC)   . Wears glasses     Patient Active Problem List   Diagnosis Date Noted  . Primary osteoarthritis of right knee 02/17/2017  . Hyponatremia 01/20/2016  . Fibromyalgia 01/20/2016  . PMR (polymyalgia rheumatica) (HCC) 01/20/2016  . Liver lesion, right lobe 01/20/2016  . Pleural effusion on right 01/20/2016  . Leukocytosis 01/20/2016  . Ankle fracture 06/08/2013  . Open right ankle fracture 06/08/2013  . Vitamin D deficiency 07/15/2007  . Essential hypertension 07/15/2007  . Esophagitis 07/15/2007  . OTHER MALAISE AND FATIGUE 07/15/2007  . URI 02/12/2007  Past Surgical History:  Procedure Laterality Date  . ABSCESS DRAINAGE Right 12/2015 - 01/2016 X 2   "inside abdomen"  . ANKLE HARDWARE REMOVAL Right 04/2014   "partial hardware removed"  . CATARACT EXTRACTION W/ INTRAOCULAR LENS  IMPLANT, BILATERAL Bilateral   . CORNEAL TRANSPLANT Bilateral 2005-2007   "twice on left side"  . EYE SURGERY    . FRACTURE SURGERY    . I&D EXTREMITY Right 06/08/2013   Procedure: IRRIGATION AND DEBRIDEMENT EXTREMITY/RIGHT ANKLE;  Surgeon: Mable Paris, MD;  Location: [facility] OR;  Service: Orthopedics;  Laterality: Right;  . IR GENERIC HISTORICAL  02/07/2016   IR RADIOLOGIST EVAL & MGMT 02/07/2016 Darrell K Allred, PA-C [facility]  INTERV RAD  . IR GENERIC HISTORICAL  02/21/2016   IR RADIOLOGIST EVAL & MGMT 02/21/2016 Barnetta Chapel, PA-C [facility] INTERV RAD  . JOINT REPLACEMENT    . ORIF ANKLE FRACTURE Right 06/08/2013   Procedure: OPEN REDUCTION INTERNAL FIXATION (ORIF) ANKLE FRACTURE;  Surgeon: Mable Paris, MD;  Location: [facility] OR;  Service: Orthopedics;  Laterality: Right;  . TOTAL KNEE ARTHROPLASTY Right 02/17/2017  . TOTAL KNEE ARTHROPLASTY Right 02/17/2017   Procedure: TOTAL KNEE ARTHROPLASTY;  Surgeon: Marcene Corning, MD;  Location: [facility] OR;  Service: Orthopedics;  Laterality: Right;     OB History   No obstetric history on file.      Home Medications    Prior to Admission medications   Medication Sig Start Date End Date Taking? Authorizing Provider  Calcium-Magnesium-Zinc [phone] MG TABS Take 1 tablet by mouth daily.   Yes [provider]  celecoxib (CELEBREX) 200 MG capsule Take 200 mg by mouth 2 (two) times daily as needed for moderate pain.  07/22/18  Yes [provider]  Cholecalciferol 2000 UNITS CAPS Take 1 capsule by mouth daily.   Yes [provider]  Coenzyme Q10 100 MG capsule Take 100 mg by mouth 3 (three) times a week.    Yes [provider]  cyanocobalamin 1000 MCG tablet Take 1 tablet by mouth daily.   Yes [provider]  esomeprazole (NEXIUM) 20 MG capsule Take 20 mg by mouth every morning.    Yes [provider]  fluorometholone (FML) 0.1 % ophthalmic suspension Place 1 drop into both eyes daily.  11/05/15  Yes [provider]  fluticasone (FLONASE) 50 MCG/ACT nasal spray Place 2 sprays into both nostrils daily as needed for allergies.  06/16/18  Yes [provider]  fluticasone (FLONASE) 50 MCG/ACT nasal spray Place 2 sprays into both nostrils daily.   Yes [provider]  LORazepam (ATIVAN) 0.5 MG tablet Take 0.5 mg by mouth daily as needed for anxiety or sleep.  05/15/18  Yes [provider]    metoCLOPramide (REGLAN) 5 MG tablet Take 5 mg by mouth as directed. Takes daily before lunch and may take up to three times daily as needed 11/06/15  Yes [provider]  Multiple Vitamin (MULTIVITAMIN WITH MINERALS) TABS tablet Take 1 tablet by mouth every other day.    Yes [provider]  Multiple Vitamins-Minerals (PRESERVISION AREDS 2 PO) Take 1 capsule by mouth daily.   Yes [provider]  Probiotic Product (TRUNATURE DIGESTIVE PROBIOTIC) CAPS Take 1 capsule by mouth daily.   Yes [provider]  sodium chloride (OCEAN) 0.65 % SOLN nasal spray Place 1 spray into both nostrils daily.    Yes [provider]  traMADol (ULTRAM) 50 MG tablet Take 25 mg by mouth 2 (two) times  daily.  12/26/15  Yes [provider]  vitamin C (ASCORBIC ACID) 500 MG tablet Take 500 mg by mouth daily.   Yes [provider]  acetaminophen (TYLENOL) 500 MG tablet Take 500 mg by mouth every 8 (eight) hours as needed for mild pain or moderate pain.    [provider]  aspirin EC 325 MG EC tablet Take 1 tablet (325 mg total) by mouth 2 (two) times daily after a meal. Patient not taking: Reported on 07/31/2018 02/19/17   Elodia Florence, PA-C  bisacodyl (DULCOLAX) 5 MG EC tablet Take 1 tablet (5 mg total) by mouth daily as needed for moderate constipation. Patient not taking: Reported on 07/31/2018 02/19/17   Elodia Florence, PA-C  cephALEXin (KEFLEX) 500 MG capsule Take 2 capsules (1,000 mg total) by mouth 2 (two) times daily. 07/31/18   Arby Barrette, MD  docusate sodium (COLACE) 100 MG capsule Take 1 capsule (100 mg total) by mouth 2 (two) times daily. Patient not taking: Reported on 07/31/2018 02/19/17   Elodia Florence, PA-C  HYDROcodone-acetaminophen (NORCO/VICODIN) 5-325 MG tablet Take 1-2 tablets by mouth every 4 (four) hours as needed (breakthrough pain). Patient not taking: Reported on 07/31/2018 02/19/17   Elodia Florence, PA-C  methocarbamol (ROBAXIN) 500 MG tablet  Take 1 tablet (500 mg total) by mouth every 6 (six) hours as needed for muscle spasms. Patient not taking: Reported on 07/31/2018 02/19/17   Elodia Florence, PA-C    Family History Family History  Problem Relation Age of Onset  . Breast cancer Mother   . Anuerysm Father   . Diabetes Brother   . Brain cancer Brother   . Alcoholism Brother     Social History Social History   Tobacco Use  . Smoking status: Never Smoker  . Smokeless tobacco: Never Used  Substance Use Topics  . Alcohol use: No  . Drug use: No     Allergies   Other; Ciprofloxacin; Doxycycline; Erythromycin; and Hepatitis a vaccine   Review of Systems Review of Systems 10 Systems reviewed and are negative for acute change except as noted in the HPI.   Physical Exam Updated Vital Signs BP (!) 146/94 (BP Location: Right Arm)   Pulse 80   Temp 97.7 F (36.5 C) (Oral)   Resp 16   Ht 5\' 2"  (1.575 m)   Wt 77 kg   SpO2 97%   BMI 31.05 kg/m   Physical Exam Constitutional:      Appearance: She is well-developed.  HENT:     Head: Normocephalic and atraumatic.     Mouth/Throat:     Mouth: Mucous membranes are moist.     Pharynx: Oropharynx is clear.  Eyes:     Extraocular Movements: Extraocular movements intact.     Pupils: Pupils are equal, round, and reactive to light.  Neck:     Musculoskeletal: Neck supple.  Cardiovascular:     Rate and Rhythm: Normal rate and regular rhythm.     Heart sounds: Normal heart sounds.  Pulmonary:     Effort: Pulmonary effort is normal.     Breath sounds: Normal breath sounds.  Abdominal:     General: Bowel sounds are normal. There is no distension.     Palpations: Abdomen is soft.     Tenderness: There is no abdominal tenderness.  Musculoskeletal: Normal range of motion.  Skin:    General: Skin is warm and dry.  Neurological:     General: No focal deficit present.     Mental  Status: She is alert and oriented to person, place, and time.     GCS: GCS eye subscore is  4. GCS verbal subscore is 5. GCS motor subscore is 6.     Cranial Nerves: No cranial nerve deficit.     Sensory: No sensory deficit.     Coordination: Coordination normal.  Psychiatric:        Mood and Affect: Mood normal.      ED Treatments / Results  Labs (all labs ordered are listed, but only abnormal results are displayed) Labs Reviewed  BASIC METABOLIC PANEL - Abnormal; Notable for the following components:      Result Value   Sodium 130 (*)    Chloride 97 (*)    Glucose, Bld 124 (*)    Calcium 8.5 (*)    All other components within normal limits  URINALYSIS, ROUTINE W REFLEX MICROSCOPIC - Abnormal; Notable for the following components:   APPearance HAZY (*)    Hgb urine dipstick SMALL (*)    Ketones, ur 5 (*)    Nitrite POSITIVE (*)    Leukocytes,Ua TRACE (*)    Bacteria, UA MANY (*)    All other components within normal limits  APTT - Abnormal; Notable for the following components:   aPTT 20 (*)    All other components within normal limits  HEPATIC FUNCTION PANEL - Abnormal; Notable for the following components:   Total Protein 6.0 (*)    All other components within normal limits  URINE CULTURE  CBC  PROTIME-INR  CK  DIFFERENTIAL  CBG MONITORING, ED  I-STAT TROPONIN, ED    EKG EKG Interpretation  Date/Time:  Saturday July 31 2018 18:35:19 EST Ventricular Rate:  82 PR Interval:    QRS Duration: 126 QT Interval:  410 QTC Calculation: 479 R Axis:   -48 Text Interpretation:  Sinus tachycardia Paired ventricular premature complexes RBBB and LAFB Probable left ventricular hypertrophy Artifact in lead(s) I II III aVR aVL aVF no acute ischemic changes Confirmed by Arby BarrettePfeiffer, Atanacio Melnyk [id number]) on 07/31/2018 8:22:47 PM   Radiology Mr Brain Wo Contrast  Result Date: 07/31/2018 CLINICAL DATA:  Dizziness, weakness, and lightheaded. Hypertension. Focal neuro deficit. EXAM: MRI HEAD WITHOUT CONTRAST TECHNIQUE: Multiplanar, multiecho pulse sequences of the brain and  surrounding structures were obtained without intravenous contrast. COMPARISON:  None. FINDINGS: Brain: No acute infarct, hemorrhage, or mass lesion is present. The ventricles are of normal size. Moderate atrophy and white matter changes are present bilaterally. White matter changes extend into the brainstem. Cerebellum is unremarkable. The ventricles are of proportionate to the degree of atrophy. No significant extraaxial fluid collection is present. The internal auditory canals are within normal limits. Cerebellum is within normal limits. Vascular: Flow is present in the major intracranial arteries. Skull and upper cervical spine: The craniocervical junction is normal. Upper cervical spine is within normal limits. Marrow signal is unremarkable. Sinuses/Orbits: The paranasal sinuses and mastoid air cells are clear. Bilateral lens replacements are noted. Globes and orbits are otherwise unremarkable. IMPRESSION: 1. Moderate atrophy and white matter changes likely reflect the sequela of chronic microvascular ischemia. 2. No acute intracranial abnormality to explain and dizziness, weakness, or lightheadedness. Electronically Signed   By: Marin Robertshristopher  Mattern M.D.   On: 07/31/2018 17:54    Procedures Procedures (including critical care time)  Medications Ordered in ED Medications  LORazepam (ATIVAN) injection 0.5 mg (0 mg Intravenous Hold 07/31/18 1720)  LORazepam (ATIVAN) tablet 1 mg (1 mg Oral Given 07/31/18 1719)  cefTRIAXone (ROCEPHIN)  1 g in sodium chloride 0.9 % 100 mL IVPB (1 g Intravenous New Bag/Given 07/31/18 1942)     Initial Impression / Assessment and Plan / ED Course  I have reviewed the triage vital signs and the nursing notes.  Pertinent labs & imaging results that were available during my care of the patient were reviewed by me and considered in my medical decision making (see chart for details).       Patient has been having some generalized symptoms of not feeling well, with fatigue,  malaise and some degree of anxiety.  Today she had an acute episode of symptoms whereby she felt off balance and her speech was slowed but not specifically slurred or unable to generate speech.  This resolved spontaneously.  MRI does not show any acute stroke.  Patient has normal neurologic exam.  She does have urinalysis positive for nitrite with many bacteria.  Patient was having urinary frequency while in the ED.  At this time will opt to treat pending culture results.  I do feel that her generalized malaise could be due to UTI.  Stroke has been ruled out.  She did not have chest pain or shortness of breath, troponin negative do not suspect cardiac ischemia.  At this time, I feel patient is stable to continue outpatient treatment and follow-up.  Return precautions reviewed..  Final Clinical Impressions(s) / ED Diagnoses   Final diagnoses:  Generalized weakness  Dizzy  Unsteady gait  Acute cystitis without hematuria    ED Discharge Orders         Ordered    cephALEXin (KEFLEX) 500 MG capsule  2 times daily     07/31/18 2014           Arby Barrette, MD 07/31/18 2025

## 2018-08-02 LAB — URINE CULTURE

## 2018-12-22 ENCOUNTER — Other Ambulatory Visit: Payer: Self-pay

## 2019-06-15 ENCOUNTER — Other Ambulatory Visit: Payer: Self-pay | Admitting: Orthopaedic Surgery

## 2019-06-15 DIAGNOSIS — M545 Low back pain, unspecified: Secondary | ICD-10-CM

## 2019-06-15 DIAGNOSIS — M546 Pain in thoracic spine: Secondary | ICD-10-CM

## 2019-06-23 ENCOUNTER — Ambulatory Visit: Payer: Medicare Other

## 2019-07-01 ENCOUNTER — Ambulatory Visit: Payer: Medicare Other | Attending: Internal Medicine

## 2019-07-01 DIAGNOSIS — Z23 Encounter for immunization: Secondary | ICD-10-CM

## 2019-07-01 NOTE — Progress Notes (Signed)
   Covid-19 Vaccination Clinic  Name:  REANNA SCOGGIN    MRN: [id number] DOB: 1935/04/02  07/01/2019  Ms. Word was observed post Covid-19 immunization for 15 minutes without incidence. She was provided with Vaccine Information Sheet and instruction to access the V-Safe system.   Ms. Arcand was instructed to call 911 with any severe reactions post vaccine: Marland Kitchen Difficulty breathing  . Swelling of your face and throat  . A fast heartbeat  . A bad rash all over your body  . Dizziness and weakness    Immunizations Administered    Name Date Dose VIS Date Route   Pfizer COVID-19 Vaccine 07/01/2019  2:08 PM 0.3 mL 05/06/2019 Intramuscular   Manufacturer: [employer], [employer]   Lot: [id number]   NDC: [address]-8

## 2019-07-09 ENCOUNTER — Other Ambulatory Visit: Payer: Medicare Other

## 2019-07-14 ENCOUNTER — Ambulatory Visit: Payer: Medicare Other

## 2019-07-27 ENCOUNTER — Ambulatory Visit: Payer: Medicare Other | Attending: Internal Medicine

## 2019-07-27 DIAGNOSIS — Z23 Encounter for immunization: Secondary | ICD-10-CM

## 2019-07-27 NOTE — Progress Notes (Signed)
   Covid-19 Vaccination Clinic  Name:  Shannon Montgomery    MRN: [id number] DOB: Sep 01, 1934  07/27/2019  Shannon Montgomery was observed post Covid-19 immunization for 15 minutes without incident. She was provided with Vaccine Information Sheet and instruction to access the V-Safe system.   Shannon Montgomery was instructed to call 911 with any severe reactions post vaccine: Marland Kitchen Difficulty breathing  . Swelling of face and throat  . A fast heartbeat  . A bad rash all over body  . Dizziness and weakness   Immunizations Administered    Name Date Dose VIS Date Route   Pfizer COVID-19 Vaccine 07/27/2019  2:10 PM 0.3 mL 05/06/2019 Intramuscular   Manufacturer: [employer], [employer]   Lot: [id number]   NDC: [address]-0

## 2020-08-09 ENCOUNTER — Other Ambulatory Visit: Payer: Self-pay | Admitting: Orthopaedic Surgery

## 2020-08-09 DIAGNOSIS — M545 Low back pain, unspecified: Secondary | ICD-10-CM

## 2020-08-09 DIAGNOSIS — M546 Pain in thoracic spine: Secondary | ICD-10-CM

## 2020-10-08 ENCOUNTER — Inpatient Hospital Stay (HOSPITAL_COMMUNITY)
Admission: EM | Admit: 2020-10-08 | Discharge: 2020-10-12 | DRG: 371 | Disposition: A | Discharge status: Home / Self Care | Payer: Medicare Other | Attending: Internal Medicine | Admitting: Internal Medicine

## 2020-10-08 ENCOUNTER — Emergency Department (HOSPITAL_COMMUNITY): Payer: Medicare Other

## 2020-10-08 ENCOUNTER — Other Ambulatory Visit: Payer: Self-pay

## 2020-10-08 ENCOUNTER — Encounter (HOSPITAL_COMMUNITY): Payer: Self-pay | Admitting: General Surgery

## 2020-10-08 DIAGNOSIS — E876 Hypokalemia: Secondary | ICD-10-CM | POA: Diagnosis not present

## 2020-10-08 DIAGNOSIS — M549 Dorsalgia, unspecified: Secondary | ICD-10-CM | POA: Diagnosis present

## 2020-10-08 DIAGNOSIS — M353 Polymyalgia rheumatica: Secondary | ICD-10-CM | POA: Diagnosis present

## 2020-10-08 DIAGNOSIS — R109 Unspecified abdominal pain: Secondary | ICD-10-CM

## 2020-10-08 DIAGNOSIS — Z7982 Long term (current) use of aspirin: Secondary | ICD-10-CM | POA: Diagnosis not present

## 2020-10-08 DIAGNOSIS — Z803 Family history of malignant neoplasm of breast: Secondary | ICD-10-CM

## 2020-10-08 DIAGNOSIS — F419 Anxiety disorder, unspecified: Secondary | ICD-10-CM | POA: Diagnosis present

## 2020-10-08 DIAGNOSIS — M797 Fibromyalgia: Secondary | ICD-10-CM | POA: Diagnosis present

## 2020-10-08 DIAGNOSIS — Z833 Family history of diabetes mellitus: Secondary | ICD-10-CM

## 2020-10-08 DIAGNOSIS — E860 Dehydration: Secondary | ICD-10-CM | POA: Diagnosis present

## 2020-10-08 DIAGNOSIS — R748 Abnormal levels of other serum enzymes: Secondary | ICD-10-CM | POA: Diagnosis present

## 2020-10-08 DIAGNOSIS — Z808 Family history of malignant neoplasm of other organs or systems: Secondary | ICD-10-CM | POA: Diagnosis not present

## 2020-10-08 DIAGNOSIS — I10 Essential (primary) hypertension: Secondary | ICD-10-CM | POA: Diagnosis present

## 2020-10-08 DIAGNOSIS — E871 Hypo-osmolality and hyponatremia: Secondary | ICD-10-CM | POA: Diagnosis present

## 2020-10-08 DIAGNOSIS — K750 Abscess of liver: Secondary | ICD-10-CM | POA: Diagnosis present

## 2020-10-08 DIAGNOSIS — Z881 Allergy status to other antibiotic agents status: Secondary | ICD-10-CM

## 2020-10-08 DIAGNOSIS — Z96651 Presence of right artificial knee joint: Secondary | ICD-10-CM | POA: Diagnosis present

## 2020-10-08 DIAGNOSIS — K8020 Calculus of gallbladder without cholecystitis without obstruction: Secondary | ICD-10-CM | POA: Diagnosis present

## 2020-10-08 DIAGNOSIS — Z79899 Other long term (current) drug therapy: Secondary | ICD-10-CM | POA: Diagnosis not present

## 2020-10-08 DIAGNOSIS — K6819 Other retroperitoneal abscess: Secondary | ICD-10-CM | POA: Diagnosis present

## 2020-10-08 DIAGNOSIS — Z811 Family history of alcohol abuse and dependence: Secondary | ICD-10-CM

## 2020-10-08 DIAGNOSIS — E861 Hypovolemia: Secondary | ICD-10-CM | POA: Diagnosis not present

## 2020-10-08 DIAGNOSIS — K2100 Gastro-esophageal reflux disease with esophagitis, without bleeding: Secondary | ICD-10-CM | POA: Diagnosis present

## 2020-10-08 DIAGNOSIS — R1011 Right upper quadrant pain: Secondary | ICD-10-CM | POA: Diagnosis present

## 2020-10-08 DIAGNOSIS — Z1611 Resistance to penicillins: Secondary | ICD-10-CM | POA: Diagnosis present

## 2020-10-08 DIAGNOSIS — Z887 Allergy status to serum and vaccine status: Secondary | ICD-10-CM

## 2020-10-08 DIAGNOSIS — M1990 Unspecified osteoarthritis, unspecified site: Secondary | ICD-10-CM | POA: Diagnosis present

## 2020-10-08 DIAGNOSIS — G8929 Other chronic pain: Secondary | ICD-10-CM | POA: Diagnosis present

## 2020-10-08 HISTORY — PX: IR GUIDED DRAIN W CATHETER PLACEMENT: IMG719

## 2020-10-08 LAB — CBC WITH DIFFERENTIAL/PLATELET
Abs Immature Granulocytes: 0.04 10*3/uL (ref 0.00–0.07)
Basophils Absolute: 0 10*3/uL (ref 0.0–0.1)
Basophils Relative: 0 %
Eosinophils Absolute: 0 10*3/uL (ref 0.0–0.5)
Eosinophils Relative: 0 %
HCT: 37.6 % (ref 36.0–46.0)
Hemoglobin: 13 g/dL (ref 12.0–15.0)
Immature Granulocytes: 0 %
Lymphocytes Relative: 5 %
Lymphs Abs: 0.5 10*3/uL — ABNORMAL LOW (ref 0.7–4.0)
MCH: 29.5 pg (ref 26.0–34.0)
MCHC: 34.6 g/dL (ref 30.0–36.0)
MCV: 85.5 fL (ref 80.0–100.0)
Monocytes Absolute: 0.7 10*3/uL (ref 0.1–1.0)
Monocytes Relative: 7 %
Neutro Abs: 8.5 10*3/uL — ABNORMAL HIGH (ref 1.7–7.7)
Neutrophils Relative %: 88 %
Platelets: 315 10*3/uL (ref 150–400)
RBC: 4.4 MIL/uL (ref 3.87–5.11)
RDW: 12.3 % (ref 11.5–15.5)
WBC: 9.9 10*3/uL (ref 4.0–10.5)
nRBC: 0 % (ref 0.0–0.2)

## 2020-10-08 LAB — COMPREHENSIVE METABOLIC PANEL
ALT: 16 U/L (ref 0–44)
AST: 22 U/L (ref 15–41)
Albumin: 3 g/dL — ABNORMAL LOW (ref 3.5–5.0)
Alkaline Phosphatase: 127 U/L — ABNORMAL HIGH (ref 38–126)
Anion gap: 9 (ref 5–15)
BUN: 7 mg/dL — ABNORMAL LOW (ref 8–23)
CO2: 27 mmol/L (ref 22–32)
Calcium: 8.9 mg/dL (ref 8.9–10.3)
Chloride: 87 mmol/L — ABNORMAL LOW (ref 98–111)
Creatinine, Ser: 0.85 mg/dL (ref 0.44–1.00)
GFR, Estimated: >60 mL/min (ref >60)
Glucose, Bld: 126 mg/dL — ABNORMAL HIGH (ref 70–99)
Potassium: 4.3 mmol/L (ref 3.5–5.1)
Sodium: 123 mmol/L — ABNORMAL LOW (ref 135–145)
Total Bilirubin: 0.9 mg/dL (ref 0.3–1.2)
Total Protein: 6.2 g/dL — ABNORMAL LOW (ref 6.5–8.1)

## 2020-10-08 LAB — URINALYSIS, ROUTINE W REFLEX MICROSCOPIC
Bilirubin Urine: NEGATIVE
Glucose, UA: NEGATIVE mg/dL
Ketones, ur: 5 mg/dL — AB
Leukocytes,Ua: NEGATIVE
Nitrite: NEGATIVE
Protein, ur: NEGATIVE mg/dL
Specific Gravity, Urine: 1.005 (ref 1.005–1.030)
pH: 7 (ref 5.0–8.0)

## 2020-10-08 LAB — GAMMA GT: GGT: 54 U/L — ABNORMAL HIGH (ref 7–50)

## 2020-10-08 LAB — CREATININE, URINE, RANDOM: Creatinine, Urine: 50.75 mg/dL

## 2020-10-08 LAB — SODIUM, URINE, RANDOM: Sodium, Ur: 32 mmol/L

## 2020-10-08 LAB — PROTIME-INR
INR: 1.1 (ref 0.8–1.2)
Prothrombin Time: 13.7 seconds (ref 11.4–15.2)

## 2020-10-08 LAB — LIPASE, BLOOD: Lipase: 29 U/L (ref 11–51)

## 2020-10-08 MED ORDER — ONDANSETRON HCL 4 MG PO TABS: 4.0000 mg | ORAL_TABLET | Freq: Four times a day (QID) | ORAL | Status: DC | PRN

## 2020-10-08 MED ORDER — PANTOPRAZOLE SODIUM 40 MG PO TBEC
40.0000 mg | DELAYED_RELEASE_TABLET | Freq: Every day | ORAL | Status: DC
  Administered 2020-10-09 – 2020-10-12 (×4): 40 mg via ORAL
  Filled 2020-10-08 (×4): qty 1

## 2020-10-08 MED ORDER — SODIUM CHLORIDE 0.9 % IV SOLN
INTRAVENOUS | Status: DC
Start: 2020-10-08 — End: 2020-10-10

## 2020-10-08 MED ORDER — MIDAZOLAM HCL 2 MG/2ML IJ SOLN
INTRAMUSCULAR | Status: AC | PRN
  Administered 2020-10-08: 1 mg via INTRAVENOUS

## 2020-10-08 MED ORDER — FENTANYL CITRATE (PF) 100 MCG/2ML IJ SOLN
INTRAMUSCULAR | Status: AC
  Filled 2020-10-08: qty 4

## 2020-10-08 MED ORDER — PIPERACILLIN-TAZOBACTAM 3.375 G IVPB
3.3750 g | Freq: Three times a day (TID) | INTRAVENOUS | Status: DC
  Administered 2020-10-08 – 2020-10-11 (×8): 3.375 g via INTRAVENOUS
  Filled 2020-10-08 (×8): qty 50

## 2020-10-08 MED ORDER — SODIUM CHLORIDE 0.9% FLUSH
5.0000 mL | Freq: Three times a day (TID) | INTRAVENOUS | Status: DC
  Administered 2020-10-08 – 2020-10-12 (×11): 5 mL

## 2020-10-08 MED ORDER — LIDOCAINE-EPINEPHRINE 1 %-1:100000 IJ SOLN
INTRAMUSCULAR | Status: AC
  Administered 2020-10-08: 8 mL via SUBCUTANEOUS
  Filled 2020-10-08: qty 1

## 2020-10-08 MED ORDER — FLUTICASONE PROPIONATE 50 MCG/ACT NA SUSP: 2.0000 | Freq: Every day | NASAL | Status: DC

## 2020-10-08 MED ORDER — MIDAZOLAM HCL 2 MG/2ML IJ SOLN
INTRAMUSCULAR | Status: AC
  Filled 2020-10-08: qty 4

## 2020-10-08 MED ORDER — SODIUM CHLORIDE 0.9% FLUSH
3.0000 mL | INTRAVENOUS | Status: DC | PRN
  Administered 2020-10-11: 3 mL via INTRAVENOUS

## 2020-10-08 MED ORDER — SODIUM CHLORIDE 0.9 % IV SOLN: 250.0000 mL | INTRAVENOUS | Status: DC | PRN

## 2020-10-08 MED ORDER — ONDANSETRON HCL 4 MG/2ML IJ SOLN
4.0000 mg | Freq: Four times a day (QID) | INTRAMUSCULAR | Status: DC | PRN
  Administered 2020-10-09: 4 mg via INTRAVENOUS
  Filled 2020-10-08: qty 2

## 2020-10-08 MED ORDER — FLUOROMETHOLONE 0.1 % OP SUSP
1.0000 [drp] | Freq: Every day | OPHTHALMIC | Status: DC
  Administered 2020-10-08 – 2020-10-12 (×5): 1 [drp] via OPHTHALMIC
  Filled 2020-10-08: qty 5

## 2020-10-08 MED ORDER — METOCLOPRAMIDE HCL 5 MG PO TABS: 5.0000 mg | ORAL_TABLET | Freq: Three times a day (TID) | ORAL | Status: DC | PRN

## 2020-10-08 MED ORDER — PIPERACILLIN-TAZOBACTAM 3.375 G IVPB 30 MIN
3.3750 g | Freq: Once | INTRAVENOUS | Status: AC
  Administered 2020-10-08: 3.375 g via INTRAVENOUS
  Filled 2020-10-08: qty 50

## 2020-10-08 MED ORDER — FENTANYL CITRATE (PF) 100 MCG/2ML IJ SOLN
INTRAMUSCULAR | Status: AC | PRN
  Administered 2020-10-08: 25 ug via INTRAVENOUS

## 2020-10-08 MED ORDER — FLUTICASONE PROPIONATE 50 MCG/ACT NA SUSP
2.0000 | Freq: Every day | NASAL | Status: DC | PRN
  Filled 2020-10-08: qty 16

## 2020-10-08 MED ORDER — SODIUM CHLORIDE 0.9% FLUSH
3.0000 mL | Freq: Two times a day (BID) | INTRAVENOUS | Status: DC
  Administered 2020-10-09 – 2020-10-11 (×5): 3 mL via INTRAVENOUS

## 2020-10-08 MED ORDER — SODIUM CHLORIDE 0.9% FLUSH: 3.0000 mL | Freq: Two times a day (BID) | INTRAVENOUS | Status: DC

## 2020-10-08 MED ORDER — LORAZEPAM 0.5 MG PO TABS
0.5000 mg | ORAL_TABLET | Freq: Every day | ORAL | Status: DC | PRN
  Administered 2020-10-10 – 2020-10-11 (×2): 0.5 mg via ORAL
  Filled 2020-10-08 (×2): qty 1

## 2020-10-08 MED ORDER — ACETAMINOPHEN 500 MG PO TABS
500.0000 mg | ORAL_TABLET | Freq: Three times a day (TID) | ORAL | Status: DC | PRN
  Administered 2020-10-09: 500 mg via ORAL
  Filled 2020-10-08: qty 1

## 2020-10-08 NOTE — H&P (Signed)
History and Physical    Shannon Montgomery WUX:[id number] DOB: 1935-05-25 DOA: 10/08/2020  PCP: Jani Gravel, MD    Patient coming from:  Home    Chief Complaint:  Right flank pain x2 weeks retroperitoneal abscess.   HPI: Shannon Montgomery is a 86 y.o. female seen in ed with complaints of right flank pain 10 out of 10 at its been going on for about 2 weeks.  Pain has been intermittent and sharp.  Patient also described right upper quadrant pain, pain when it occurs it lasts a few minutes.  Patient does not note any alleviating or aggravating factors.  Does associate some nausea and loss of appetite, no vomiting constipation diarrhea fevers chills headaches chest pain shortness of breath coughing, bleeding.  Patient had an episode of retropharyngeal abscess about 5 years ago and was drained by IR at [facility].  Pt has past medical history of Retroperitoneal abscess in 2017, hyponatremia, fibromyalgia, PMR, pleural effusion, GERD, chronic sinusitis, vitamin D deficiency, hypertension.  ED Course:  Vitals:   10/08/20 1100 10/08/20 1115 10/08/20 1215 10/08/20 1230  BP: (!) 147/77 (!) 149/83 (!) 151/75 (!) 148/79  Pulse: 92 84 91 80  Resp: (!) 22 20 (!) 24 (!) 25  Temp:      TempSrc:      SpO2: 96% 96% 97% 94%  Weight:      Height:      In ED pt is alert,awake and oriented  And afebrile and labs show hyponatremia with a sodium of 123, chloride of 87, glucose 126, creatinine of 0.85, alkaline phosphatase of 127, AST ALT within normal limits, total bili within normal limits.  CBC is within normal limits, UA is clear, moderate blood.  In the emergency room patient received Zosyn 3.375 g  X 1.  Blood cultures collected.  Review of Systems:  Review of Systems  Gastrointestinal: Positive for abdominal pain and nausea.  All other systems reviewed and are negative.    Past Medical History:  Diagnosis Date  . Anxiety   . Arthritis   . Chronic back pain   . Chronic sinusitis   .  Degenerative joint disease of knee, right   . Fibromyalgia   . GERD (gastroesophageal reflux disease)   . Osteoarthritis    "full of it" (02/18/2017)  . PMR (polymyalgia rheumatica) (HCC)   . Wears glasses     Past Surgical History:  Procedure Laterality Date  . ABSCESS DRAINAGE Right 12/2015 - 01/2016 X 2   "inside abdomen"  . ANKLE HARDWARE REMOVAL Right 04/2014   "partial hardware removed"  . CATARACT EXTRACTION W/ INTRAOCULAR LENS  IMPLANT, BILATERAL Bilateral   . CORNEAL TRANSPLANT Bilateral 2005-2007   "twice on left side"  . EYE SURGERY    . FRACTURE SURGERY    . I & D EXTREMITY Right 06/08/2013   Procedure: IRRIGATION AND DEBRIDEMENT EXTREMITY/RIGHT ANKLE;  Surgeon: Nita Sells, MD;  Location: [place];  Service: Orthopedics;  Laterality: Right;  . IR GENERIC HISTORICAL  02/07/2016   IR RADIOLOGIST EVAL & MGMT 02/07/2016 Darrell K Allred, PA-C [facility] INTERV RAD  . IR GENERIC HISTORICAL  02/21/2016   IR RADIOLOGIST EVAL & MGMT 02/21/2016 Saverio Danker, PA-C [facility] INTERV RAD  . JOINT REPLACEMENT    . ORIF ANKLE FRACTURE Right 06/08/2013   Procedure: OPEN REDUCTION INTERNAL FIXATION (ORIF) ANKLE FRACTURE;  Surgeon: Nita Sells, MD;  Location: [place];  Service: Orthopedics;  Laterality: Right;  . TOTAL KNEE ARTHROPLASTY Right  02/17/2017  . TOTAL KNEE ARTHROPLASTY Right 02/17/2017   Procedure: TOTAL KNEE ARTHROPLASTY;  Surgeon: Melrose Nakayama, MD;  Location: [place];  Service: Orthopedics;  Laterality: Right;     reports that she has never smoked. She has never used smokeless tobacco. She reports that she does not drink alcohol and does not use drugs.  Allergies  Allergen Reactions  . Other Other (See Comments)    PLEASE GIVE PT NAUSEA MED BEFORE GIVING ANY PAIN MEDICATION - PT CAN BECOME VERY SICK   . Ciprofloxacin Nausea Only  . Doxycycline Nausea Only  . Erythromycin Nausea Only  . Hepatitis A Vaccine Swelling    Family History  Problem Relation Age of  Onset  . Breast cancer Mother   . Anuerysm Father   . Diabetes Brother   . Brain cancer Brother   . Alcoholism Brother     Prior to Admission medications   Medication Sig Start Date End Date Taking? Authorizing Provider  acetaminophen (TYLENOL) 500 MG tablet Take 500 mg by mouth every 8 (eight) hours as needed for mild pain or moderate pain.    [provider]  aspirin EC 325 MG EC tablet Take 1 tablet (325 mg total) by mouth 2 (two) times daily after a meal. Patient not taking: Reported on 07/31/2018 02/19/17   Loni Dolly, PA-C  bisacodyl (DULCOLAX) 5 MG EC tablet Take 1 tablet (5 mg total) by mouth daily as needed for moderate constipation. Patient not taking: Reported on 07/31/2018 02/19/17   Loni Dolly, PA-C  Calcium-Magnesium-Zinc 517-001-7 MG TABS Take 1 tablet by mouth daily.    [provider]  celecoxib (CELEBREX) 200 MG capsule Take 200 mg by mouth 2 (two) times daily as needed for moderate pain.  07/22/18   [provider]  cephALEXin (KEFLEX) 500 MG capsule Take 2 capsules (1,000 mg total) by mouth 2 (two) times daily. 07/31/18   Charlesetta Shanks, MD  Cholecalciferol 2000 UNITS CAPS Take 1 capsule by mouth daily.    [provider]  Coenzyme Q10 100 MG capsule Take 100 mg by mouth 3 (three) times a week.     [provider]  cyanocobalamin 1000 MCG tablet Take 1 tablet by mouth daily.    [provider]  docusate sodium (COLACE) 100 MG capsule Take 1 capsule (100 mg total) by mouth 2 (two) times daily. Patient not taking: Reported on 07/31/2018 02/19/17   Loni Dolly, PA-C  esomeprazole (NEXIUM) 20 MG capsule Take 20 mg by mouth every morning.     [provider]  fluorometholone (FML) 0.1 % ophthalmic suspension Place 1 drop into both eyes daily.  11/05/15   [provider]  fluticasone (FLONASE) 50 MCG/ACT nasal spray Place 2 sprays into both nostrils daily as needed for allergies.  06/16/18   [provider]  fluticasone (FLONASE) 50 MCG/ACT nasal spray Place 2 sprays into both nostrils daily.    [provider]  HYDROcodone-acetaminophen (NORCO/VICODIN) 5-325 MG tablet Take 1-2 tablets by mouth every 4 (four) hours as needed (breakthrough pain). Patient not taking: Reported on 07/31/2018 02/19/17   Loni Dolly, PA-C  LORazepam (ATIVAN) 0.5 MG tablet Take 0.5 mg by mouth daily as needed for anxiety or sleep.  05/15/18   [provider]  methocarbamol (ROBAXIN) 500 MG tablet Take 1 tablet (500 mg total) by mouth every 6 (six) hours as needed for muscle spasms. Patient not taking: Reported on 07/31/2018 02/19/17   Loni Dolly, PA-C  metoCLOPramide [facility])  5 MG tablet Take 5 mg by mouth as directed. Takes daily before lunch and may take up to three times daily as needed 11/06/15   [provider]  Multiple Vitamin (MULTIVITAMIN WITH MINERALS) TABS tablet Take 1 tablet by mouth every other day.     [provider]  Multiple Vitamins-Minerals (PRESERVISION AREDS 2 PO) Take 1 capsule by mouth daily.    [provider]  Probiotic Product (TRUNATURE DIGESTIVE PROBIOTIC) CAPS Take 1 capsule by mouth daily.    [provider]  sodium chloride (OCEAN) 0.65 % SOLN nasal spray Place 1 spray into both nostrils daily.     [provider]  traMADol (ULTRAM) 50 MG tablet Take 25 mg by mouth 2 (two) times daily.  12/26/15   [provider]  vitamin C (ASCORBIC ACID) 500 MG tablet Take 500 mg by mouth daily.    [provider]    Physical Exam: Vitals:   10/08/20 1100 10/08/20 1115 10/08/20 1215 10/08/20 1230  BP: (!) 147/77 (!) 149/83 (!) 151/75 (!) 148/79  Pulse: 92 84 91 80  Resp: (!) 22 20 (!) 24 (!) 25  Temp:      TempSrc:      SpO2: 96% 96% 97% 94%  Weight:      Height:       Physical Exam Vitals and nursing note reviewed.  Constitutional:      General: She is not in acute distress.    Appearance: She is not ill-appearing,  toxic-appearing or diaphoretic.  HENT:     Head: Normocephalic and atraumatic.     Nose: Nose normal.     Mouth/Throat:     Mouth: Mucous membranes are moist.  Eyes:     Extraocular Movements: Extraocular movements intact.  Cardiovascular:     Rate and Rhythm: Normal rate and regular rhythm.  Pulmonary:     Effort: Pulmonary effort is normal.     Breath sounds: Normal breath sounds.  Abdominal:     General: Bowel sounds are increased. There is no distension or abdominal bruit.     Palpations: Abdomen is soft.     Tenderness: There is abdominal tenderness in the right upper quadrant. There is right CVA tenderness.  Skin:    General: Skin is warm.  Neurological:     General: No focal deficit present.     Mental Status: She is alert and oriented to person, place, and time.  Psychiatric:        Mood and Affect: Mood normal.        Behavior: Behavior normal.    Labs on Admission: I have personally reviewed following labs and imaging studies  No results for input(s): CKTOTAL, CKMB, TROPONINI in the last 72 hours. Lab Results  Component Value Date   WBC 9.9 10/08/2020   HGB 13.0 10/08/2020   HCT 37.6 10/08/2020   MCV 85.5 10/08/2020   PLT 315 10/08/2020    Recent Labs  Lab 10/08/20 0858  NA 123*  K 4.3  CL 87*  CO2 27  BUN 7*  CREATININE 0.85  CALCIUM 8.9  PROT 6.2*  BILITOT 0.9  ALKPHOS 127*  ALT 16  AST 22  GLUCOSE 126*   No results found for: CHOL, HDL, LDLCALC, TRIG No results found for: DDIMER Invalid input(s): POCBNP  Urinalysis    Component Value Date/Time   COLORURINE YELLOW 10/08/2020 [place] 10/08/2020 0858   LABSPEC 1.005 10/08/2020 [place] 7.0 10/08/2020 0858  GLUCOSEU NEGATIVE 10/08/2020 0858   HGBUR MODERATE (A) 10/08/2020 0858   BILIRUBINUR NEGATIVE 10/08/2020 0858   KETONESUR 5 (A) 10/08/2020 0858   PROTEINUR NEGATIVE 10/08/2020 0858   NITRITE NEGATIVE 10/08/2020 0858   LEUKOCYTESUR NEGATIVE 10/08/2020 0858     Radiological Exams on Admission: CT Abdomen Pelvis Wo Contrast  Result Date: 10/08/2020 CLINICAL DATA:  Right upper quadrant pain, right flank pain. History of retroperitoneal abscess EXAM: CT ABDOMEN AND PELVIS WITHOUT CONTRAST TECHNIQUE: Multidetector CT imaging of the abdomen and pelvis was performed following the standard protocol without IV contrast. COMPARISON:  02/21/2016 FINDINGS: Lower chest: Scarring at the right lung base. Trace right pleural effusion. Scattered calcifications in the visualized coronary arteries and the distal thoracic aorta. Hepatobiliary: Multiple gallstones fill the gallbladder. No focal hepatic abnormality. Pancreas: No focal abnormality or ductal dilatation. Spleen: No focal abnormality.  Normal size. Adrenals/Urinary Tract: No adrenal abnormality. No focal renal abnormality. No stones or hydronephrosis. Urinary bladder is unremarkable. Stomach/Bowel: Sigmoid diverticulosis. No active diverticulitis. Stomach and small bowel decompressed, unremarkable. Appendix is normal. Vascular/Lymphatic: Aortoiliac atherosclerosis. No evidence of aneurysm or adenopathy. Reproductive: Small left ovarian cyst measures 1.9 cm. Adjacent dystrophic calcifications in the left adnexa, stable. Uterus and right adnexa unremarkable. Other: There is a fluid collection in the right abdomen posterior to the right hepatic lobe and extending lateral to the right kidney which measures 6.5 x 6.2 x 4.0 cm. Locules of gas are noted within this fluid collection. Findings compatible with recurrent abscess. Musculoskeletal: No acute bony abnormality. IMPRESSION: Fluid collection in the right posterior abdomen posterior to the right hepatic lobe and lateral to the right kidney most compatible with abscess. Cholelithiasis. Trace right pleural effusion. Coronary artery disease, aortic atherosclerosis. Electronically Signed   By: Rolm Baptise M.D.   On: 10/08/2020 10:51    EKG: Independently reviewed.   None  Assessment/Plan Principal Problem:   Retroperitoneal abscess (HCC) Active Problems:   Essential hypertension   Hyponatremia   Elevated alkaline phosphatase level Retroperitoneal abscess: Etiology and recurrence cause still uncertain.   Differentials include associated infections and tracking whether is from the colon or from the hepatobiliary system. Patient does have clinical right upper quadrant pain with elevated alk phos we will obtain right upper quadrant ultrasound because of her cholelithiasis.   Appreciate general surgery management and care with plans of drainage of abscess. We will follow blood cultures and wound cultures. We will will consider MRI of the liver, colonoscopy, 2D echocardiogram depending on blood cultures.  Essential hypertension: No home blood pressure medications. As needed hydralazine, amlodipine 2.5 mg to be started today.  Hyponatremia: Attribute to dehydration decreased trend in sodium and chloride.  We will start patient on normal saline at 75 cc an hour for the next 2 days. Caution with starting diuretic therapy for antihypertensive secondary to natremia.   Elevated alk phos level:  We will obtain right upper quadrant ultrasound and GGT for any hepatobiliary source.  GERD/esophagitis: Patient is on PPIs and nausea medication.   We will continue patient on Protonix and as needed Zofran.     DVT prophylaxis:  SCDs  Code Status:  Full code  Family Communication:  Mullins (Spouse)  [phone] (Home Phone)  Disposition Plan:  Home  Consults called:  None  Admission status: Inpatient.   Para Skeans MD [facility] [phone] How to contact the [facility] Attending or Consulting provider [place] or covering provider during after hours [place], for this patient.  1. Check the care team in [facility] and look for a) attending/consulting [facility] provider listed and b) the [facility] team listed 2. Log into [email or web address] and use  [place]'s universal password to access. If you do not have the password, please contact the hospital operator. 3. Locate the [facility] provider you are looking for under [facility] and page to a number that you can be directly reached. 4. If you still have difficulty reaching the provider, please page the [facility] (Director on Call) for the Hospitalists listed on amion for assistance. [email or web address] Password [facility] 10/08/2020, 12:55 PM

## 2020-10-08 NOTE — ED Provider Notes (Signed)
[facility] EMERGENCY DEPARTMENT Provider Note   CSN: [id number] Arrival date & time: 10/08/20  2706     History Chief Complaint  Patient presents with  . Abdominal Pain  . Flank Pain    Shannon Montgomery is a 86 y.o. female with PMH of hypertension, fibromyalgia, abdominal abscess and chronic hyponatremia presents to the ED via EMS for evaluation of abdominal pain.  Patient reports she has had intermittent right upper quadrant pain for 1 week.  Pain is described as sharp and lasting a few minutes.  There is no aggravating or relieving factors. Pain is non-radiating. Patient describes the pain as 10/10 pain at worst.  She reports associated loss of appetite and some nausea this morning but denies any vomiting, constipation, diarrhea, fever, chills, headaches, chest pain, shortness of breath, dysuria or back pain.  Of note, patient had similar presentation 5 years ago in which she was found to have a retroperitoneal abscess which required drainage by IR.     Past Medical History:  Diagnosis Date  . Anxiety   . Arthritis   . Chronic back pain   . Chronic sinusitis   . Degenerative joint disease of knee, right   . Fibromyalgia   . GERD (gastroesophageal reflux disease)   . Osteoarthritis    "full of it" (02/18/2017)  . PMR (polymyalgia rheumatica) (HCC)   . Wears glasses     Patient Active Problem List   Diagnosis Date Noted  . Primary osteoarthritis of right knee 02/17/2017  . Hyponatremia 01/20/2016  . Fibromyalgia 01/20/2016  . PMR (polymyalgia rheumatica) (HCC) 01/20/2016  . Liver lesion, right lobe 01/20/2016  . Pleural effusion on right 01/20/2016  . Leukocytosis 01/20/2016  . Ankle fracture 06/08/2013  . Open right ankle fracture 06/08/2013  . Vitamin D deficiency 07/15/2007  . Essential hypertension 07/15/2007  . Esophagitis 07/15/2007  . OTHER MALAISE AND FATIGUE 07/15/2007  . URI 02/12/2007    Past Surgical History:  Procedure Laterality  Date  . ABSCESS DRAINAGE Right 12/2015 - 01/2016 X 2   "inside abdomen"  . ANKLE HARDWARE REMOVAL Right 04/2014   "partial hardware removed"  . CATARACT EXTRACTION W/ INTRAOCULAR LENS  IMPLANT, BILATERAL Bilateral   . CORNEAL TRANSPLANT Bilateral 2005-2007   "twice on left side"  . EYE SURGERY    . FRACTURE SURGERY    . I & D EXTREMITY Right 06/08/2013   Procedure: IRRIGATION AND DEBRIDEMENT EXTREMITY/RIGHT ANKLE;  Surgeon: Mable Paris, MD;  Location: [facility] OR;  Service: Orthopedics;  Laterality: Right;  . IR GENERIC HISTORICAL  02/07/2016   IR RADIOLOGIST EVAL & MGMT 02/07/2016 Darrell K Allred, PA-C [facility] INTERV RAD  . IR GENERIC HISTORICAL  02/21/2016   IR RADIOLOGIST EVAL & MGMT 02/21/2016 Barnetta Chapel, PA-C [facility] INTERV RAD  . JOINT REPLACEMENT    . ORIF ANKLE FRACTURE Right 06/08/2013   Procedure: OPEN REDUCTION INTERNAL FIXATION (ORIF) ANKLE FRACTURE;  Surgeon: Mable Paris, MD;  Location: [facility] OR;  Service: Orthopedics;  Laterality: Right;  . TOTAL KNEE ARTHROPLASTY Right 02/17/2017  . TOTAL KNEE ARTHROPLASTY Right 02/17/2017   Procedure: TOTAL KNEE ARTHROPLASTY;  Surgeon: Marcene Corning, MD;  Location: [facility] OR;  Service: Orthopedics;  Laterality: Right;     OB History   No obstetric history on file.     Family History  Problem Relation Age of Onset  . Breast cancer Mother   . Anuerysm Father   . Diabetes Brother   . Brain cancer Brother   .  Alcoholism Brother     Social History   Tobacco Use  . Smoking status: Never Smoker  . Smokeless tobacco: Never Used  Vaping Use  . Vaping Use: Never used  Substance Use Topics  . Alcohol use: No  . Drug use: No    Home Medications Prior to Admission medications   Medication Sig Start Date End Date Taking? Authorizing Provider  acetaminophen (TYLENOL) 500 MG tablet Take 500 mg by mouth every 8 (eight) hours as needed for mild pain or moderate pain.    [provider]  aspirin EC 325 MG EC tablet  Take 1 tablet (325 mg total) by mouth 2 (two) times daily after a meal. Patient not taking: Reported on 07/31/2018 02/19/17   Elodia Florence, PA-C  bisacodyl (DULCOLAX) 5 MG EC tablet Take 1 tablet (5 mg total) by mouth daily as needed for moderate constipation. Patient not taking: Reported on 07/31/2018 02/19/17   Elodia Florence, PA-C  Calcium-Magnesium-Zinc 485-462-7 MG TABS Take 1 tablet by mouth daily.    [provider]  celecoxib (CELEBREX) 200 MG capsule Take 200 mg by mouth 2 (two) times daily as needed for moderate pain.  07/22/18   [provider]  cephALEXin (KEFLEX) 500 MG capsule Take 2 capsules (1,000 mg total) by mouth 2 (two) times daily. 07/31/18   Arby Barrette, MD  Cholecalciferol 2000 UNITS CAPS Take 1 capsule by mouth daily.    [provider]  Coenzyme Q10 100 MG capsule Take 100 mg by mouth 3 (three) times a week.     [provider]  cyanocobalamin 1000 MCG tablet Take 1 tablet by mouth daily.    [provider]  docusate sodium (COLACE) 100 MG capsule Take 1 capsule (100 mg total) by mouth 2 (two) times daily. Patient not taking: Reported on 07/31/2018 02/19/17   Elodia Florence, PA-C  esomeprazole (NEXIUM) 20 MG capsule Take 20 mg by mouth every morning.     [provider]  fluorometholone (FML) 0.1 % ophthalmic suspension Place 1 drop into both eyes daily.  11/05/15   [provider]  fluticasone (FLONASE) 50 MCG/ACT nasal spray Place 2 sprays into both nostrils daily as needed for allergies.  06/16/18   [provider]  fluticasone (FLONASE) 50 MCG/ACT nasal spray Place 2 sprays into both nostrils daily.    [provider]  HYDROcodone-acetaminophen (NORCO/VICODIN) 5-325 MG tablet Take 1-2 tablets by mouth every 4 (four) hours as needed (breakthrough pain). Patient not taking: Reported on 07/31/2018 02/19/17   Elodia Florence, PA-C  LORazepam (ATIVAN) 0.5 MG tablet Take 0.5 mg by mouth daily as needed for anxiety  or sleep.  05/15/18   [provider]  methocarbamol (ROBAXIN) 500 MG tablet Take 1 tablet (500 mg total) by mouth every 6 (six) hours as needed for muscle spasms. Patient not taking: Reported on 07/31/2018 02/19/17   Elodia Florence, PA-C  metoCLOPramide (REGLAN) 5 MG tablet Take 5 mg by mouth as directed. Takes daily before lunch and may take up to three times daily as needed 11/06/15   [provider]  Multiple Vitamin (MULTIVITAMIN WITH MINERALS) TABS tablet Take 1 tablet by mouth every other day.     [provider]  Multiple Vitamins-Minerals (PRESERVISION AREDS 2 PO) Take 1 capsule by mouth daily.    [provider]  Probiotic Product (TRUNATURE DIGESTIVE PROBIOTIC) CAPS Take 1 capsule by mouth daily.    [provider]  sodium chloride (OCEAN) 0.65 % SOLN nasal  spray Place 1 spray into both nostrils daily.     [provider]  traMADol (ULTRAM) 50 MG tablet Take 25 mg by mouth 2 (two) times daily.  12/26/15   [provider]  vitamin C (ASCORBIC ACID) 500 MG tablet Take 500 mg by mouth daily.    [provider]    Allergies    Other, Ciprofloxacin, Doxycycline, Erythromycin, and Hepatitis a vaccine  Review of Systems   Review of Systems  Constitutional: Positive for appetite change. Negative for chills and fever.  Respiratory: Negative for cough and shortness of breath.   Cardiovascular: Negative for chest pain.  Gastrointestinal: Positive for abdominal pain and nausea. Negative for abdominal distention, blood in stool, constipation, diarrhea and vomiting.  Genitourinary: Negative for dysuria.  Musculoskeletal: Positive for neck pain.  Neurological: Negative for dizziness, weakness and headaches.  Psychiatric/Behavioral: The patient is nervous/anxious.     Physical Exam Updated Vital Signs BP (!) 158/77   Pulse 77   Temp (!) 97.5 F (36.4 C) (Oral)   Resp (!) 21   Ht 5\' 1"  (1.549 m)   Wt 68 kg   SpO2 95%   BMI  28.34 kg/m   Physical Exam HENT:     Head: Normocephalic and atraumatic.  Cardiovascular:     Rate and Rhythm: Normal rate and regular rhythm.     Heart sounds: Normal heart sounds.  Pulmonary:     Effort: Pulmonary effort is normal. No respiratory distress.     Breath sounds: Normal breath sounds.  Abdominal:     General: Abdomen is flat. Bowel sounds are normal. There is no distension.     Palpations: Abdomen is soft. There is no mass.     Tenderness: There is abdominal tenderness in the right upper quadrant. There is no guarding or rebound.  Skin:    General: Skin is warm and dry.     Findings: No rash.     Comments: Evidence of varicose veins in the lower extremities  Neurological:     General: No focal deficit present.     Mental Status: She is alert.  Psychiatric:        Mood and Affect: Mood normal.     ED Results / Procedures / Treatments   Labs (all labs ordered are listed, but only abnormal results are displayed) Labs Reviewed  CBC WITH DIFFERENTIAL/PLATELET - Abnormal; Notable for the following components:      Result Value   Neutro Abs 8.5 (*)    Lymphs Abs 0.5 (*)    All other components within normal limits  COMPREHENSIVE METABOLIC PANEL - Abnormal; Notable for the following components:   Sodium 123 (*)    Chloride 87 (*)    Glucose, Bld 126 (*)    BUN 7 (*)    Total Protein 6.2 (*)    Albumin 3.0 (*)    Alkaline Phosphatase 127 (*)    All other components within normal limits  URINALYSIS, ROUTINE W REFLEX MICROSCOPIC - Abnormal; Notable for the following components:   Hgb urine dipstick MODERATE (*)    Ketones, ur 5 (*)    Bacteria, UA RARE (*)    All other components within normal limits  CULTURE, BLOOD (ROUTINE X 2)  CULTURE, BLOOD (ROUTINE X 2)  LIPASE, BLOOD  SODIUM, URINE, RANDOM  CREATININE, URINE, RANDOM    EKG None  Radiology CT Abdomen Pelvis Wo Contrast  Result Date: 10/08/2020 CLINICAL DATA:  Right upper quadrant pain, right  flank  pain. History of retroperitoneal abscess EXAM: CT ABDOMEN AND PELVIS WITHOUT CONTRAST TECHNIQUE: Multidetector CT imaging of the abdomen and pelvis was performed following the standard protocol without IV contrast. COMPARISON:  02/21/2016 FINDINGS: Lower chest: Scarring at the right lung base. Trace right pleural effusion. Scattered calcifications in the visualized coronary arteries and the distal thoracic aorta. Hepatobiliary: Multiple gallstones fill the gallbladder. No focal hepatic abnormality. Pancreas: No focal abnormality or ductal dilatation. Spleen: No focal abnormality.  Normal size. Adrenals/Urinary Tract: No adrenal abnormality. No focal renal abnormality. No stones or hydronephrosis. Urinary bladder is unremarkable. Stomach/Bowel: Sigmoid diverticulosis. No active diverticulitis. Stomach and small bowel decompressed, unremarkable. Appendix is normal. Vascular/Lymphatic: Aortoiliac atherosclerosis. No evidence of aneurysm or adenopathy. Reproductive: Small left ovarian cyst measures 1.9 cm. Adjacent dystrophic calcifications in the left adnexa, stable. Uterus and right adnexa unremarkable. Other: There is a fluid collection in the right abdomen posterior to the right hepatic lobe and extending lateral to the right kidney which measures 6.5 x 6.2 x 4.0 cm. Locules of gas are noted within this fluid collection. Findings compatible with recurrent abscess. Musculoskeletal: No acute bony abnormality. IMPRESSION: Fluid collection in the right posterior abdomen posterior to the right hepatic lobe and lateral to the right kidney most compatible with abscess. Cholelithiasis. Trace right pleural effusion. Coronary artery disease, aortic atherosclerosis. Electronically Signed   By: Charlett Nose M.D.   On: 10/08/2020 10:51    Procedures Procedures   Medications Ordered in ED Medications  piperacillin-tazobactam (ZOSYN) IVPB 3.375 g (has no administration in time range)    ED Course  I have reviewed  the triage vital signs and the nursing notes.  Pertinent labs & imaging results that were available during my care of the patient were reviewed by me and considered in my medical decision making (see chart for details).  Clinical Course as of 10/08/20 1112  Mon Oct 08, 2020  1423 CBC with Differential [PA]  0937 Comprehensive metabolic panel [PA]  0959 Hgb urine dipstickMarland Kitchen): MODERATE [PA]    Clinical Course User Index [PA] Steffanie Rainwater, MD   MDM Rules/Calculators/A&P                          86 year old female with a past medical history of retroperitoneal abscess, liver lesion and chronic hyponatremia presenting for evaluation of intermittent right upper quadrant pain. On exam, patient is nontoxic-appearing. There is mild tenderness to palpation of the right upper quadrant but otherwise no guarding or rebound tenderness.  No leukocytosis on CBC. Urinalysis shows moderate hemoglobinuria and rare bacteria but overall negative for UTI. On CMP, patient has acute on chronic hyponatremia with a sodium of 123. Baseline sodium around 130.  Pending work-up with urine studies.  CT abdomen/pelvis shows recurrent retroperitoneal abscess. Gen surg consulted and they recommend IR consult for drainage. IR plan to insert drain.   Patient admitted to hospitalist service.  Final Clinical Impression(s) / ED Diagnoses Final diagnoses:  Retroperitoneal abscess [facility])    Rx / DC Orders ED Discharge Orders    None       Steffanie Rainwater, MD 10/08/20 1230    Milagros Loll, MD 10/09/20 1136

## 2020-10-08 NOTE — Consult Note (Signed)
Shannon Montgomery 02/02/1935  [id number].    Requesting MD: Dr. Marianna Fuss Chief Complaint/Reason for Consult: intra-abdominal fluid collection  HPI:  This is an 86 yo white female with a history of polymyalgia rheumatica, fibromyalgia, CBP, and HTN who was seen by our service in 2017 for a perihepatic abscess with unclear etiology, at that time questioning from the appendix vs colon pathology.  She was treated with 2 IR drains and followed up with Dr. Derrell Lolling.  Drains were able to be removed as an outpatient and it was recommended that she see GI for a colonoscopy to further decide on surgical planning.  It appears she did not have had a colonoscopy and did not further follow up with Dr. Derrell Lolling.  She has otherwise been in her normal state of health until 1 week ago when she started having intermittent RUQ abdominal pain.  It is sharp in nature and lasts for several minutes at a time. She also reports some pain in her right back. She has had some anorexia and nausea prior to presenting to the ED, but denies fevers, diarrhea, constipation, vomiting, CP, SOB, dysuria, etc.    Here in the ED she underwent a CT scan which reveals a recurrent posterior hepatic abscess near the right kidney.  Her appendix is normal on this CT scan.  No other findings noted that would indicate the etiology of this collection.  Her CBC is normal.  She does have a Na level of 123 with a history of chronic hyponatremia.  UA is relatively  Normal and blood cultures are pending.  We have been asked to see her for further recommendations.  No prior h/o abdominal surgery Nonsmoker Denies alcohol or illicit drug use Lives at home with her husband Uses walker PRN ambulation   ROS: ROS: Please see HPI, otherwise all other systems have been reviewed and are negative.  Family History  Problem Relation Age of Onset  . Breast cancer Mother   . Anuerysm Father   . Diabetes Brother   . Brain cancer Brother   .  Alcoholism Brother     Past Medical History:  Diagnosis Date  . Anxiety   . Arthritis   . Chronic back pain   . Chronic sinusitis   . Degenerative joint disease of knee, right   . Fibromyalgia   . GERD (gastroesophageal reflux disease)   . Osteoarthritis    "full of it" (02/18/2017)  . PMR (polymyalgia rheumatica) (HCC)   . Wears glasses     Past Surgical History:  Procedure Laterality Date  . ABSCESS DRAINAGE Right 12/2015 - 01/2016 X 2   "inside abdomen"  . ANKLE HARDWARE REMOVAL Right 04/2014   "partial hardware removed"  . CATARACT EXTRACTION W/ INTRAOCULAR LENS  IMPLANT, BILATERAL Bilateral   . CORNEAL TRANSPLANT Bilateral 2005-2007   "twice on left side"  . EYE SURGERY    . FRACTURE SURGERY    . I & D EXTREMITY Right 06/08/2013   Procedure: IRRIGATION AND DEBRIDEMENT EXTREMITY/RIGHT ANKLE;  Surgeon: Mable Paris, MD;  Location: [facility] OR;  Service: Orthopedics;  Laterality: Right;  . IR GENERIC HISTORICAL  02/07/2016   IR RADIOLOGIST EVAL & MGMT 02/07/2016 Darrell K Allred, PA-C [facility] INTERV RAD  . IR GENERIC HISTORICAL  02/21/2016   IR RADIOLOGIST EVAL & MGMT 02/21/2016 Barnetta Chapel, PA-C [facility] INTERV RAD  . JOINT REPLACEMENT    . ORIF ANKLE FRACTURE Right 06/08/2013   Procedure: OPEN REDUCTION INTERNAL FIXATION (ORIF)  ANKLE FRACTURE;  Surgeon: Mable ParisJustin William Chandler, MD;  [facility];  Service: Orthopedics;  Laterality: Right;  . TOTAL KNEE ARTHROPLASTY Right 02/17/2017  . TOTAL KNEE ARTHROPLASTY Right 02/17/2017   Procedure: TOTAL KNEE ARTHROPLASTY;  Surgeon: Marcene Corningalldorf, Peter, MD;  Location: [facility] OR;  Service: Orthopedics;  Laterality: Right;    Social History:  reports that she has never smoked. She has never used smokeless tobacco. She reports that she does not drink alcohol and does not use drugs.  Allergies:  Allergies  Allergen Reactions  . Other Other (See Comments)    PLEASE GIVE PT NAUSEA MED BEFORE GIVING ANY PAIN MEDICATION - PT CAN BECOME VERY  SICK   . Ciprofloxacin Nausea Only  . Doxycycline Nausea Only  . Erythromycin Nausea Only  . Hepatitis A Vaccine Swelling    (Not in a hospital admission)    Physical Exam: Blood pressure (!) 149/83, pulse 84, temperature (!) 97.5 F (36.4 C), temperature source Oral, resp. rate 20, height 5\' 1"  (1.549 m), weight 68 kg, SpO2 96 %. General: pleasant, WD, WN white female who is laying in bed in NAD HEENT: head is normocephalic, atraumatic.  Sclera are noninjected.  Pupils equal and round.  Ears and nose without any masses or lesions.  Mouth is pink and moist Heart: regular, rate, and rhythm.  Normal s1,s2. No obvious murmurs, gallops, or rubs noted.  Palpable radial and pedal pulses bilaterally Lungs: CTAB, no wheezes, rhonchi, or rales noted.  Respiratory effort nonlabored Abd: soft, minimal RUQ TTP without rebound or guarding, ND, +BS, no masses, hernias, or organomegaly MS: all 4 extremities are symmetrical with no cyanosis, clubbing, or edema. Skin: warm and dry with no masses, lesions, or rashes Neuro: Cranial nerves 2-12 grossly intact, sensation is normal throughout Psych: A&Ox3 with an appropriate affect.   Results for orders placed or performed during the hospital encounter of 10/08/20 (from the past 48 hour(s))  CBC with Differential     Status: Abnormal   Collection Time: 10/08/20  8:58 AM  Result Value Ref Range   WBC 9.9 4.0 - 10.5 K/uL   RBC 4.40 3.87 - 5.11 MIL/uL   Hemoglobin 13.0 12.0 - 15.0 g/dL   HCT 16.137.6 09.636.0 - 04.546.0 %   MCV 85.5 80.0 - 100.0 fL   MCH 29.5 26.0 - 34.0 pg   MCHC 34.6 30.0 - 36.0 g/dL   RDW 40.912.3 81.111.5 - 91.415.5 %   Platelets 315 150 - 400 K/uL   nRBC 0.0 0.0 - 0.2 %   Neutrophils Relative % 88 %   Neutro Abs 8.5 (H) 1.7 - 7.7 K/uL   Lymphocytes Relative 5 %   Lymphs Abs 0.5 (L) 0.7 - 4.0 K/uL   Monocytes Relative 7 %   Monocytes Absolute 0.7 0.1 - 1.0 K/uL   Eosinophils Relative 0 %   Eosinophils Absolute 0.0 0.0 - 0.5 K/uL   Basophils  Relative 0 %   Basophils Absolute 0.0 0.0 - 0.1 K/uL   Immature Granulocytes 0 %   Abs Immature Granulocytes 0.04 0.00 - 0.07 K/uL    Comment: Performed at [facility], [address] [place], [place] [id number]  Comprehensive metabolic panel     Status: Abnormal   Collection Time: 10/08/20  8:58 AM  Result Value Ref Range   Sodium 123 (L) 135 - 145 mmol/L   Potassium 4.3 3.5 - 5.1 mmol/L   Chloride 87 (L) 98 - 111 mmol/L   CO2 27 22 -  32 mmol/L   Glucose, Bld 126 (H) 70 - 99 mg/dL    Comment: Glucose reference range applies only to samples taken after fasting for at least 8 hours.   BUN 7 (L) 8 - 23 mg/dL   Creatinine, Ser 5.46 0.44 - 1.00 mg/dL   Calcium 8.9 8.9 - 27.0 mg/dL   Total Protein 6.2 (L) 6.5 - 8.1 g/dL   Albumin 3.0 (L) 3.5 - 5.0 g/dL   AST 22 15 - 41 U/L   ALT 16 0 - 44 U/L   Alkaline Phosphatase 127 (H) 38 - 126 U/L   Total Bilirubin 0.9 0.3 - 1.2 mg/dL   GFR, Estimated >35 >00 mL/min    Comment: (NOTE) Calculated using the CKD-EPI Creatinine Equation (2021)    Anion gap 9 5 - 15    Comment: Performed at [facility], [address], [place], [place] [address]  Lipase, blood     Status: None   Collection Time: 10/08/20  8:58 AM  Result Value Ref Range   Lipase 29 11 - 51 U/L    Comment: Performed at [facility], [address], [place], [place] [address]  Urinalysis, Routine w reflex microscopic Urine, Clean Catch     Status: Abnormal   Collection Time: 10/08/20  8:58 AM  Result Value Ref Range   Color, Urine YELLOW YELLOW   APPearance CLEAR CLEAR   Specific Gravity, Urine 1.005 1.005 - 1.030   pH 7.0 5.0 - 8.0   Glucose, UA NEGATIVE NEGATIVE mg/dL   Hgb urine dipstick MODERATE (A) NEGATIVE   Bilirubin Urine NEGATIVE NEGATIVE   Ketones, ur 5 (A) NEGATIVE mg/dL   Protein, ur NEGATIVE NEGATIVE mg/dL   Nitrite NEGATIVE NEGATIVE   Leukocytes,Ua NEGATIVE NEGATIVE   RBC / HPF 0-5 0 - 5 RBC/hpf   WBC, UA 0-5 0 - 5 WBC/hpf   Bacteria, UA  RARE (A) NONE SEEN   Squamous Epithelial / LPF 0-5 0 - 5    Comment: Performed at [facility], [address] [place], [place] [address]   CT Abdomen Pelvis Wo Contrast  Result Date: 10/08/2020 CLINICAL DATA:  Right upper quadrant pain, right flank pain. History of retroperitoneal abscess EXAM: CT ABDOMEN AND PELVIS WITHOUT CONTRAST TECHNIQUE: Multidetector CT imaging of the abdomen and pelvis was performed following the standard protocol without IV contrast. COMPARISON:  02/21/2016 FINDINGS: Lower chest: Scarring at the right lung base. Trace right pleural effusion. Scattered calcifications in the visualized coronary arteries and the distal thoracic aorta. Hepatobiliary: Multiple gallstones fill the gallbladder. No focal hepatic abnormality. Pancreas: No focal abnormality or ductal dilatation. Spleen: No focal abnormality.  Normal size. Adrenals/Urinary Tract: No adrenal abnormality. No focal renal abnormality. No stones or hydronephrosis. Urinary bladder is unremarkable. Stomach/Bowel: Sigmoid diverticulosis. No active diverticulitis. Stomach and small bowel decompressed, unremarkable. Appendix is normal. Vascular/Lymphatic: Aortoiliac atherosclerosis. No evidence of aneurysm or adenopathy. Reproductive: Small left ovarian cyst measures 1.9 cm. Adjacent dystrophic calcifications in the left adnexa, stable. Uterus and right adnexa unremarkable. Other: There is a fluid collection in the right abdomen posterior to the right hepatic lobe and extending lateral to the right kidney which measures 6.5 x 6.2 x 4.0 cm. Locules of gas are noted within this fluid collection. Findings compatible with recurrent abscess. Musculoskeletal: No acute bony abnormality. IMPRESSION: Fluid collection in the right posterior abdomen posterior to the right hepatic lobe and lateral to the right kidney most compatible with abscess. Cholelithiasis. Trace right  pleural effusion. Coronary artery disease, aortic atherosclerosis.  Electronically Signed   By: Charlett Nose M.D.   On: 10/08/2020 10:51   Anti-infectives (From admission, onward)   Start     Dose/Rate Route Frequency Ordered Stop   10/08/20 2200  piperacillin-tazobactam (ZOSYN) IVPB 3.375 g        3.375 g 12.5 mL/hr over 240 Minutes Intravenous Every 8 hours 10/08/20 1111     10/08/20 1115  piperacillin-tazobactam (ZOSYN) IVPB 3.375 g        3.375 g 100 mL/hr over 30 Minutes Intravenous  Once 10/08/20 1113 10/08/20 1228        Assessment/Plan Acute on chronic hyponatremia - per primary service CBP Polymyalgia rheumatica Fibromyalgia HTN - per primary  Recurrent posterior hepatic/RTP abscess, unclear etiology The patient has a recurrent abscess similar to 5 years ago but significant less involved than her prior scan.  Her appendix is normal on this scan and this is very high and posterior to think this is related to her appendix.  No obvious colon masses noted, but patient still needs a GI evaluation at some point to rule out colonic etiology - likely after discharge.  In the interim, we agree with abx therapy and IR evaluation for drain placement.  No acute surgical needs.  We will follow with you.  FEN - NPO for possible IR procedure VTE - ok for prophylaxis from our standpoint after procedure ID - zosyn 5/16 -->   Carlena Bjornstad, [facility] 10/08/2020, 12:24 PM Please see Amion for pager number during day hours 7:00am-4:30pm or 7:00am -11:30am on weekends

## 2020-10-08 NOTE — ED Notes (Signed)
Patients husband updated via telephone

## 2020-10-08 NOTE — ED Notes (Signed)
Tried to give report to 6N 9. [place] Spaniel they will call back

## 2020-10-08 NOTE — Procedures (Signed)
Interventional Radiology Procedure Note  Procedure: Ultrasound and fluoroscopic guided perihepatic drain placement  Findings: Please refer to procedural dictation for full description. 10.2 Fr drain placed in right perihepatic complex fluid collection.  Approximately 50 mL purulent aspirate.  No sinogram performed due to contrast shortage.  Complications: None immediate  Estimated Blood Loss: < 5 mL  Recommendations: Keep to bulb suction for now.  Follow fluid cultures. IR will follow.   Marliss Coots, MD

## 2020-10-08 NOTE — ED Notes (Signed)
Patient transported to IR 

## 2020-10-08 NOTE — Consult Note (Signed)
Chief Complaint: Patient was seen in consultation today for right retroperitoneal abscess drain placement Chief Complaint  Patient presents with  . Abdominal Pain  . Flank Pain   at the request of Shannon Shannon Montgomery   Supervising Physician: Shannon Montgomery, Shannon  [facility] - [facility]  History of Present Illness: Shannon Montgomery is a 86 y.o. female   Hx perihepatic abscess of unclear etiology 2017 Had 2 drains placed then with CCS Did not follow up with Shannon Montgomery CCS as planned.  Developed RUQ pain almost 1 week ago Some pain in back too CT today: IMPRESSION: Fluid collection in the right posterior abdomen posterior to the right hepatic lobe and lateral to the right kidney most compatible with abscess.  Request for abscess drain  Shannon Montgomery has reviewed imaging and approves procedure   Past Medical History:  Diagnosis Date  . Anxiety   . Arthritis   . Chronic back pain   . Chronic sinusitis   . Degenerative joint disease of knee, right   . Fibromyalgia   . GERD (gastroesophageal reflux disease)   . Osteoarthritis    "full of it" (02/18/2017)  . PMR (polymyalgia rheumatica) (HCC)   . Wears glasses     Past Surgical History:  Procedure Laterality Date  . ABSCESS DRAINAGE Right 12/2015 - 01/2016 X 2   "inside abdomen"  . ANKLE HARDWARE REMOVAL Right 04/2014   "partial hardware removed"  . CATARACT EXTRACTION W/ INTRAOCULAR LENS  IMPLANT, BILATERAL Bilateral   . CORNEAL TRANSPLANT Bilateral 2005-2007   "twice on left side"  . EYE SURGERY    . FRACTURE SURGERY    . I & D EXTREMITY Right 06/08/2013   Procedure: IRRIGATION AND DEBRIDEMENT EXTREMITY/RIGHT ANKLE;  Surgeon: Mable ParisJustin William Chandler, MD;  [facility];  Service: Orthopedics;  Laterality: Right;  . IR GENERIC HISTORICAL  02/07/2016   IR RADIOLOGIST EVAL & MGMT 02/07/2016 Shannon K Allred, PA-C [facility] INTERV RAD  . IR GENERIC HISTORICAL  02/21/2016   IR RADIOLOGIST EVAL & MGMT 02/21/2016 Barnetta ChapelKelly Osborne, PA-C  [facility] INTERV RAD  . JOINT REPLACEMENT    . ORIF ANKLE FRACTURE Right 06/08/2013   Procedure: OPEN REDUCTION INTERNAL FIXATION (ORIF) ANKLE FRACTURE;  Surgeon: Mable ParisJustin William Chandler, MD;  [facility];  Service: Orthopedics;  Laterality: Right;  . TOTAL KNEE ARTHROPLASTY Right 02/17/2017  . TOTAL KNEE ARTHROPLASTY Right 02/17/2017   Procedure: TOTAL KNEE ARTHROPLASTY;  Surgeon: Shannon Corningalldorf, Peter, MD;  Location: [facility] OR;  Service: Orthopedics;  Laterality: Right;    Allergies: Other, Ciprofloxacin, Doxycycline, Erythromycin, and Hepatitis a vaccine  Medications: Prior to Admission medications   Medication Sig Start Date End Date Taking? Authorizing Provider  acetaminophen (TYLENOL) 500 MG tablet Take 500 mg by mouth every 8 (eight) hours as needed for mild pain or moderate pain.    [provider]  aspirin EC 325 MG EC tablet Take 1 tablet (325 mg total) by mouth 2 (two) times daily after a meal. Patient not taking: Reported on 07/31/2018 02/19/17   Shannon FlorenceNida, Andrew, PA-C  bisacodyl (DULCOLAX) 5 MG EC tablet Take 1 tablet (5 mg total) by mouth daily as needed for moderate constipation. Patient not taking: Reported on 07/31/2018 02/19/17   Shannon Florenceida, Andrew, PA-C  Calcium-Magnesium-Zinc [id number] MG TABS Take 1 tablet by mouth daily.    [provider]  celecoxib (CELEBREX) 200 MG capsule Take 200 mg by mouth 2 (two) times daily as needed for moderate pain.  07/22/18   [provider]  cephALEXin (KEFLEX) 500 MG capsule Take 2 capsules (1,000 mg total) by mouth 2 (two) times daily. 07/31/18   Arby Barrette, MD  Cholecalciferol 2000 UNITS CAPS Take 1 capsule by mouth daily.    [provider]  Coenzyme Q10 100 MG capsule Take 100 mg by mouth 3 (three) times a week.     [provider]  cyanocobalamin 1000 MCG tablet Take 1 tablet by mouth daily.    [provider]  docusate sodium (COLACE) 100 MG capsule Take 1 capsule (100 mg total) by mouth 2 (two) times  daily. Patient not taking: Reported on 07/31/2018 02/19/17   Shannon Florence, PA-C  esomeprazole (NEXIUM) 20 MG capsule Take 20 mg by mouth every morning.     [provider]  fluorometholone (FML) 0.1 % ophthalmic suspension Place 1 drop into both eyes daily.  11/05/15   [provider]  fluticasone (FLONASE) 50 MCG/ACT nasal spray Place 2 sprays into both nostrils daily as needed for allergies.  06/16/18   [provider]  fluticasone (FLONASE) 50 MCG/ACT nasal spray Place 2 sprays into both nostrils daily.    [provider]  HYDROcodone-acetaminophen (NORCO/VICODIN) 5-325 MG tablet Take 1-2 tablets by mouth every 4 (four) hours as needed (breakthrough pain). Patient not taking: Reported on 07/31/2018 02/19/17   Shannon Florence, PA-C  LORazepam (ATIVAN) 0.5 MG tablet Take 0.5 mg by mouth daily as needed for anxiety or sleep.  05/15/18   [provider]  methocarbamol (ROBAXIN) 500 MG tablet Take 1 tablet (500 mg total) by mouth every 6 (six) hours as needed for muscle spasms. Patient not taking: Reported on 07/31/2018 02/19/17   Shannon Florence, PA-C  metoCLOPramide (REGLAN) 5 MG tablet Take 5 mg by mouth as directed. Takes daily before lunch and may take up to three times daily as needed 11/06/15   [provider]  Multiple Vitamin (MULTIVITAMIN WITH MINERALS) TABS tablet Take 1 tablet by mouth every other day.     [provider]  Multiple Vitamins-Minerals (PRESERVISION AREDS 2 PO) Take 1 capsule by mouth daily.    [provider]  Probiotic Product (TRUNATURE DIGESTIVE PROBIOTIC) CAPS Take 1 capsule by mouth daily.    [provider]  sodium chloride (OCEAN) 0.65 % SOLN nasal spray Place 1 spray into both nostrils daily.     [provider]  traMADol (ULTRAM) 50 MG tablet Take 25 mg by mouth 2 (two) times daily.  12/26/15   [provider]  vitamin C (ASCORBIC ACID) 500 MG tablet Take 500 mg by mouth daily.     [provider]     Family History  Problem Relation Age of Onset  . Breast cancer Mother   . Anuerysm Father   . Diabetes Brother   . Brain cancer Brother   . Alcoholism Brother     Social History   Socioeconomic History  . Marital status: Married    Spouse name: Not on file  . Number of children: Not on file  . Years of education: Not on file  . Highest education level: Not on file  Occupational History  . Not on file  Tobacco Use  . Smoking status: Never Smoker  . Smokeless tobacco: Never Used  Vaping Use  . Vaping Use: Never used  Substance and Sexual Activity  . Alcohol use: No  . Drug use: No  . Sexual activity: Never    Birth control/protection: None  Other Topics Concern  .  Not on file  Social History Narrative  . Not on file   Social Determinants of Health   Financial Resource Strain: Not on file  Food Insecurity: Not on file  Transportation Needs: Not on file  Physical Activity: Not on file  Stress: Not on file  Social Connections: Not on file    Review of Systems: A 12 point ROS discussed and pertinent positives are indicated in the HPI above.  All other systems are negative.  Review of Systems  Constitutional: Positive for activity change. Negative for fever.  Respiratory: Negative for cough and shortness of breath.   Gastrointestinal: Positive for abdominal pain and nausea.  Musculoskeletal: Positive for back pain.  Psychiatric/Behavioral: Negative for behavioral problems and confusion.    Vital Signs: BP (!) 157/79   Pulse 88   Temp (!) 97.5 F (36.4 C) (Oral)   Resp (!) 21   Ht 5\' 1"  (1.549 m)   Wt 150 lb (68 kg)   SpO2 97%   BMI 28.34 kg/m   Physical Exam Vitals reviewed.  HENT:     Mouth/Throat:     Mouth: Mucous membranes are moist.  Cardiovascular:     Rate and Rhythm: Normal rate and regular rhythm.  Pulmonary:     Breath sounds: Normal breath sounds.  Abdominal:     Palpations: Abdomen is soft.      Tenderness: There is abdominal tenderness.  Skin:    General: Skin is warm.  Neurological:     Mental Status: She is alert and oriented to person, place, and time.  Psychiatric:        Behavior: Behavior normal.     Imaging: CT Abdomen Pelvis Wo Contrast  Result Date: 10/08/2020 CLINICAL DATA:  Right upper quadrant pain, right flank pain. History of retroperitoneal abscess EXAM: CT ABDOMEN AND PELVIS WITHOUT CONTRAST TECHNIQUE: Multidetector CT imaging of the abdomen and pelvis was performed following the standard protocol without IV contrast. COMPARISON:  02/21/2016 FINDINGS: Lower chest: Scarring at the right lung base. Trace right pleural effusion. Scattered calcifications in the visualized coronary arteries and the distal thoracic aorta. Hepatobiliary: Multiple gallstones fill the gallbladder. No focal hepatic abnormality. Pancreas: No focal abnormality or ductal dilatation. Spleen: No focal abnormality.  Normal size. Adrenals/Urinary Tract: No adrenal abnormality. No focal renal abnormality. No stones or hydronephrosis. Urinary bladder is unremarkable. Stomach/Bowel: Sigmoid diverticulosis. No active diverticulitis. Stomach and small bowel decompressed, unremarkable. Appendix is normal. Vascular/Lymphatic: Aortoiliac atherosclerosis. No evidence of aneurysm or adenopathy. Reproductive: Small left ovarian cyst measures 1.9 cm. Adjacent dystrophic calcifications in the left adnexa, stable. Uterus and right adnexa unremarkable. Other: There is a fluid collection in the right abdomen posterior to the right hepatic lobe and extending lateral to the right kidney which measures 6.5 x 6.2 x 4.0 cm. Locules of gas are noted within this fluid collection. Findings compatible with recurrent abscess. Musculoskeletal: No acute bony abnormality. IMPRESSION: Fluid collection in the right posterior abdomen posterior to the right hepatic lobe and lateral to the right kidney most compatible with abscess.  Cholelithiasis. Trace right pleural effusion. Coronary artery disease, aortic atherosclerosis. Electronically Signed   By: 02/23/2016 M.D.   On: 10/08/2020 10:51    Labs:  CBC: Recent Labs    10/08/20 0858  WBC 9.9  HGB 13.0  HCT 37.6  PLT 315    COAGS: Recent Labs    10/08/20 0858  INR 1.1    BMP: Recent Labs    10/08/20 0858  NA 123*  K 4.3  CL 87*  CO2 27  GLUCOSE 126*  BUN 7*  CALCIUM 8.9  CREATININE 0.85  GFRNONAA >60    LIVER FUNCTION TESTS: Recent Labs    10/08/20 0858  BILITOT 0.9  AST 22  ALT 16  ALKPHOS 127*  PROT 6.2*  ALBUMIN 3.0*    TUMOR MARKERS: No results for input(s): AFPTM, CEA, CA199, CHROMGRNA in the last 8760 hours.  Assessment and Plan:  Right retroperitoneal abscess  Drain to be placed in IR today Risks and benefits discussed with the patient including bleeding, infection, damage to adjacent structures, bowel perforation/fistula connection, and sepsis.  All of the patient's questions were answered, patient is agreeable to proceed. Consent signed and in chart.   Thank you for this interesting consult.  I greatly enjoyed meeting Shannon Montgomery and look forward to participating in their care.  A copy of this report was sent to the requesting provider on this date.  Electronically Signed: Robet Leu, PA-C 10/08/2020, 1:56 PM   I spent a total of 40 Minutes    in face to face in clinical consultation, greater than 50% of which was counseling/coordinating care for right retroperitoneal abscess drain placement

## 2020-10-08 NOTE — ED Triage Notes (Signed)
Pt brought to ED via EMS from home with c/o right sided flank pain x 2 weeks. Pt endorses hx of abd cysts and abscess. Alert and oriented in ED, rates pain 10/10.  EMS v/s: 165/92 98 HR

## 2020-10-09 ENCOUNTER — Inpatient Hospital Stay (HOSPITAL_COMMUNITY): Payer: Medicare Other

## 2020-10-09 DIAGNOSIS — K6819 Other retroperitoneal abscess: Secondary | ICD-10-CM | POA: Diagnosis not present

## 2020-10-09 LAB — COMPREHENSIVE METABOLIC PANEL
ALT: 17 U/L (ref 0–44)
AST: 19 U/L (ref 15–41)
Albumin: 2.5 g/dL — ABNORMAL LOW (ref 3.5–5.0)
Alkaline Phosphatase: 109 U/L (ref 38–126)
Anion gap: 8 (ref 5–15)
BUN: 6 mg/dL — ABNORMAL LOW (ref 8–23)
CO2: 28 mmol/L (ref 22–32)
Calcium: 8.4 mg/dL — ABNORMAL LOW (ref 8.9–10.3)
Chloride: 90 mmol/L — ABNORMAL LOW (ref 98–111)
Creatinine, Ser: 0.84 mg/dL (ref 0.44–1.00)
GFR, Estimated: >60 mL/min (ref >60)
Glucose, Bld: 132 mg/dL — ABNORMAL HIGH (ref 70–99)
Potassium: 3.3 mmol/L — ABNORMAL LOW (ref 3.5–5.1)
Sodium: 126 mmol/L — ABNORMAL LOW (ref 135–145)
Total Bilirubin: 0.8 mg/dL (ref 0.3–1.2)
Total Protein: 5.7 g/dL — ABNORMAL LOW (ref 6.5–8.1)

## 2020-10-09 LAB — CBC WITH DIFFERENTIAL/PLATELET
Abs Immature Granulocytes: 0.04 10*3/uL (ref 0.00–0.07)
Basophils Absolute: 0 10*3/uL (ref 0.0–0.1)
Basophils Relative: 0 %
Eosinophils Absolute: 0.1 10*3/uL (ref 0.0–0.5)
Eosinophils Relative: 1 %
HCT: 39.1 % (ref 36.0–46.0)
Hemoglobin: 13.3 g/dL (ref 12.0–15.0)
Immature Granulocytes: 1 %
Lymphocytes Relative: 8 %
Lymphs Abs: 0.6 10*3/uL — ABNORMAL LOW (ref 0.7–4.0)
MCH: 29.3 pg (ref 26.0–34.0)
MCHC: 34 g/dL (ref 30.0–36.0)
MCV: 86.1 fL (ref 80.0–100.0)
Monocytes Absolute: 0.4 10*3/uL (ref 0.1–1.0)
Monocytes Relative: 5 %
Neutro Abs: 6.8 10*3/uL (ref 1.7–7.7)
Neutrophils Relative %: 85 %
Platelets: 322 10*3/uL (ref 150–400)
RBC: 4.54 MIL/uL (ref 3.87–5.11)
RDW: 12.4 % (ref 11.5–15.5)
WBC: 8 10*3/uL (ref 4.0–10.5)
nRBC: 0 % (ref 0.0–0.2)

## 2020-10-09 MED ORDER — POTASSIUM CHLORIDE CRYS ER 20 MEQ PO TBCR
20.0000 meq | EXTENDED_RELEASE_TABLET | Freq: Two times a day (BID) | ORAL | Status: AC
  Administered 2020-10-09 – 2020-10-10 (×4): 20 meq via ORAL
  Filled 2020-10-09 (×4): qty 1

## 2020-10-09 NOTE — Progress Notes (Signed)
PROGRESS NOTE    Shannon Montgomery  ZOX:[id number] DOB: 11-Jul-1934 DOA: 10/08/2020 PCP: Pearson GrippeKim, James, MD    Brief Narrative:  86 year old female with history of essential hypertension, history of perihepatic abscess drained 5 years ago presented to the hospital with right flank pain for about 2 weeks.  Intermittent sharp pain.  No events for the last 5 years.  Previously advised colonoscopy but declined and even declines now.  In the emergency room, Hemodynamically stable.  Found to have retrohepatic abscess and drained by radiology.  Admitted with IV Zosyn.  Assessment & Plan:   Principal Problem:   Retroperitoneal abscess (HCC) Active Problems:   Essential hypertension   Hyponatremia   Elevated alkaline phosphatase level  Recurrent posterior hepatic retroperitoneal abscess: Unclear etiology.  Unknown primary source.  Asymptomatic since 5 years. Status post percutaneous drain by IR, clinically stable.  Continue drain care.  Remains on Zosyn. Previous abscess in 2017 positive for Streptococcus, blood cultures were negative. Patient declines to have colonoscopy even after this event. Anticipate discharge home with oral antibiotics and drain care after cultures available. Followed by surgery.  Hyponatremia: Suspect hypovolemic hyponatremia.  Continue isotonic fluid today.  Recheck level tomorrow morning.  Hypokalemia: Replace further.  Check potassium and magnesium level in the morning.  Hypertension: Blood pressure stable.  Does not want to be on medications.  DVT prophylaxis: SCDs Start: 10/08/20 1619   Code Status: Full code Family Communication: Husband at bedside Disposition Plan: Status is: Inpatient  Remains inpatient appropriate because:Inpatient level of care appropriate due to severity of illness   Dispo: The patient is from: Home              Anticipated d/c is to: Home              Patient currently is not medically stable to d/c.   Difficult to place patient  No         Consultants:   Interventional radiology  Surgery  Procedures:   Percutaneous drain 5/16  Antimicrobials:   Zosyn 5/16----   Subjective: Patient seen and examined.  Husband was at the bedside.  She has some shortness. First thing she asked is "you not going to do anymore surgery only, I do not want any colonoscopies". "Let me go home as soon as possible" Remains largely stable and with minimal pain.  Not mobilized yet.  Objective: Vitals:   10/09/20 0457 10/09/20 0500 10/09/20 1054 10/09/20 1427  BP: (!) 141/80  122/77 137/69  Pulse: 86  79 87  Resp: 16  18   Temp: 98.2 F (36.8 C)  97.7 F (36.5 C) 98.3 F (36.8 C)  TempSrc: Oral  Oral Oral  SpO2: 96%  96% 97%  Weight:  69.9 kg    Height:        Intake/Output Summary (Last 24 hours) at 10/09/2020 1446 Last data filed at 10/09/2020 1009 Gross per 24 hour  Intake 1751.79 ml  Output 620 ml  Net 1131.79 ml   Filed Weights   10/08/20 0850 10/09/20 0500  Weight: 68 kg 69.9 kg    Examination:  General exam: Appears calm and comfortable , on room air. Respiratory system: Clear to auscultation. Respiratory effort normal.  No added sounds. Cardiovascular system: S1 & S2 heard, RRR. No JVD, murmurs, rubs, gallops or clicks. No pedal edema. Gastrointestinal system: Abdomen is nondistended, soft and nontender. No organomegaly or masses felt. Normal bowel sounds heard. No localized tenderness. Posterior drain with purulent material in the  bulb. Central nervous system: Alert and oriented. No focal neurological deficits. Extremities: Symmetric 5 x 5 power. Skin: No rashes, lesions or ulcers Psychiatry: Judgement and insight appear normal. Mood & affect appropriate.     Data Reviewed: I have personally reviewed following labs and imaging studies  CBC: Recent Labs  Lab 10/08/20 0858 10/09/20 0819  WBC 9.9 8.0  NEUTROABS 8.5* 6.8  HGB 13.0 13.3  HCT 37.6 39.1  MCV 85.5 86.1  PLT 315 322    Basic Metabolic Panel: Recent Labs  Lab 10/08/20 0858 10/09/20 0819  NA 123* 126*  K 4.3 3.3*  CL 87* 90*  CO2 27 28  GLUCOSE 126* 132*  BUN 7* 6*  CREATININE 0.85 0.84  CALCIUM 8.9 8.4*   GFR: Estimated Creatinine Clearance: 44.5 mL/min (by C-G formula based on SCr of 0.84 mg/dL). Liver Function Tests: Recent Labs  Lab 10/08/20 0858 10/09/20 0819  AST 22 19  ALT 16 17  ALKPHOS 127* 109  BILITOT 0.9 0.8  PROT 6.2* 5.7*  ALBUMIN 3.0* 2.5*   Recent Labs  Lab 10/08/20 0858  LIPASE 29   No results for input(s): AMMONIA in the last 168 hours. Coagulation Profile: Recent Labs  Lab 10/08/20 0858  INR 1.1   Cardiac Enzymes: No results for input(s): CKTOTAL, CKMB, CKMBINDEX, TROPONINI in the last 168 hours. BNP (last 3 results) No results for input(s): PROBNP in the last 8760 hours. HbA1C: No results for input(s): HGBA1C in the last 72 hours. CBG: No results for input(s): GLUCAP in the last 168 hours. Lipid Profile: No results for input(s): CHOL, HDL, LDLCALC, TRIG, CHOLHDL, LDLDIRECT in the last 72 hours. Thyroid Function Tests: No results for input(s): TSH, T4TOTAL, FREET4, T3FREE, THYROIDAB in the last 72 hours. Anemia Panel: No results for input(s): VITAMINB12, FOLATE, FERRITIN, TIBC, IRON, RETICCTPCT in the last 72 hours. Sepsis Labs: No results for input(s): PROCALCITON, LATICACIDVEN in the last 168 hours.  Recent Results (from the past 240 hour(s))  Blood culture (routine x 2)     Status: None (Preliminary result)   Collection Time: 10/08/20 11:20 AM   Specimen: BLOOD LEFT WRIST  Result Value Ref Range Status   Specimen Description BLOOD LEFT WRIST  Final   Special Requests   Final    BOTTLES DRAWN AEROBIC AND ANAEROBIC Blood Culture results may not be optimal due to an inadequate volume of blood received in culture bottles   Culture   Final    NO GROWTH < 24 HOURS Performed at [facility], [address] [place], [place] [address]     Report Status PENDING  Incomplete  Blood culture (routine x 2)     Status: None (Preliminary result)   Collection Time: 10/08/20 11:25 AM   Specimen: BLOOD  Result Value Ref Range Status   Specimen Description BLOOD RIGHT ANTECUBITAL  Final   Special Requests   Final    BOTTLES DRAWN AEROBIC AND ANAEROBIC Blood Culture results may not be optimal due to an inadequate volume of blood received in culture bottles   Culture   Final    NO GROWTH < 24 HOURS Performed at [facility], [address] [place], [place] [address]    Report Status PENDING  Incomplete  Aerobic/Anaerobic Culture (surgical/deep wound)     Status: None (Preliminary result)   Collection Time: 10/08/20  4:04 PM   Specimen: Liver; Abscess  Result Value Ref Range Status   Specimen Description LIVER  Final   Special  Requests NONE  Final   Gram Stain   Final    ABUNDANT WBC PRESENT, PREDOMINANTLY PMN MODERATE GRAM POSITIVE COCCI FEW GRAM NEGATIVE RODS    Culture   Final    MODERATE KLEBSIELLA PNEUMONIAE SUSCEPTIBILITIES TO FOLLOW Performed at [facility], [address] [place], [place] [address]    Report Status PENDING  Incomplete         Radiology Studies: CT Abdomen Pelvis Wo Contrast  Result Date: 10/08/2020 CLINICAL DATA:  Right upper quadrant pain, right flank pain. History of retroperitoneal abscess EXAM: CT ABDOMEN AND PELVIS WITHOUT CONTRAST TECHNIQUE: Multidetector CT imaging of the abdomen and pelvis was performed following the standard protocol without IV contrast. COMPARISON:  02/21/2016 FINDINGS: Lower chest: Scarring at the right lung base. Trace right pleural effusion. Scattered calcifications in the visualized coronary arteries and the distal thoracic aorta. Hepatobiliary: Multiple gallstones fill the gallbladder. No focal hepatic abnormality. Pancreas: No focal abnormality or ductal dilatation. Spleen: No focal abnormality.  Normal size. Adrenals/Urinary Tract: No adrenal  abnormality. No focal renal abnormality. No stones or hydronephrosis. Urinary bladder is unremarkable. Stomach/Bowel: Sigmoid diverticulosis. No active diverticulitis. Stomach and small bowel decompressed, unremarkable. Appendix is normal. Vascular/Lymphatic: Aortoiliac atherosclerosis. No evidence of aneurysm or adenopathy. Reproductive: Small left ovarian cyst measures 1.9 cm. Adjacent dystrophic calcifications in the left adnexa, stable. Uterus and right adnexa unremarkable. Other: There is a fluid collection in the right abdomen posterior to the right hepatic lobe and extending lateral to the right kidney which measures 6.5 x 6.2 x 4.0 cm. Locules of gas are noted within this fluid collection. Findings compatible with recurrent abscess. Musculoskeletal: No acute bony abnormality. IMPRESSION: Fluid collection in the right posterior abdomen posterior to the right hepatic lobe and lateral to the right kidney most compatible with abscess. Cholelithiasis. Trace right pleural effusion. Coronary artery disease, aortic atherosclerosis. Electronically Signed   By: Charlett Nose M.D.   On: 10/08/2020 10:51   IR Guided Horace Porteous W Catheter Placement  Result Date: 10/08/2020 INDICATION: 86 year old female with 2 weeks of right flank pain with CT findings compatible with recurrence right perihepatic fluid collection concerning for abscess. EXAM: Ultrasound and fluoroscopic guided abscess drain placement MEDICATIONS: The patient is currently admitted to the hospital and receiving intravenous antibiotics. The antibiotics were administered within an appropriate time frame prior to the initiation of the procedure. ANESTHESIA/SEDATION: Fentanyl 50 mcg IV; Versed 1 mg IV Moderate Sedation Time:  11 The patient was continuously monitored during the procedure by the interventional radiology nurse under my direct supervision. COMPLICATIONS: None immediate. PROCEDURE: Informed written consent was obtained from the patient after a  thorough discussion of the procedural risks, benefits and alternatives. All questions were addressed. Maximal Sterile Barrier Technique was utilized including caps, mask, sterile gowns, sterile gloves, sterile drape, hand hygiene and skin antiseptic. A timeout was performed prior to the initiation of the procedure. The patient was positioned in the partial left lateral cubitus position. All preprocedure ultrasound evaluation demonstrated complex perihepatic fluid collection measuring approximately 7 x 4 cm with scattered internal echogenicities. The procedure was planned. The right mid axillary area was prepped and draped in standard fashion. Subdermal Local anesthesia was provided 1% lidocaine. A small skin nick was made. Under direct ultrasound visualization, deeper local anesthetic was provided along the peritoneum. Next, under direct ultrasound visualization, a 21 gauge Chiba needle was directed into the central aspect of the fluid collection. A 0.018 inch wire was then coiled in the fluid collection, confirmed  by fluoroscopy. Neck is to set was then introduced over the wire with the inner portions and wire removed. There was purulent aspirate. An Amplatz wire was then inserted to the Actiq set. Serial dilation was performed and a 10.2 French locking pigtail catheter was then placed with the pigtail portion within the fluid collection. The catheter was affixed to the skin with an interrupted 0 Prolene suture. The catheter was placed to bulb suction. Approximately 50 mL of purulent aspirate were removed. A sample was sent to the lab for culture. The patient tolerated the procedure well was returned to the floor in good condition. IMPRESSION: Technically successful percutaneous right upper quadrant/perihepatic abscess drain placement with ultrasound and fluoroscopic guidance. Approximately 50 mL of purulent aspirate was aspirated. A sample was sent for culture. Marliss Coots, MD [facility] Radiology Electronically Signed   By: Marliss Coots MD   On: 10/08/2020 16:22   US Abdomen Limited RUQ (LIVER/GB)  Result Date: 10/09/2020 CLINICAL DATA:  Abdominal pain known liver abscess EXAM: ULTRASOUND ABDOMEN LIMITED RIGHT UPPER QUADRANT COMPARISON:  CT Oct 08, 2020. FINDINGS: Gallbladder: Multiple gallstones measuring up to 2.1 cm. No pericholecystic fluid or wall thickening visualized. No sonographic Murphy sign noted by sonographer. Common bile duct: Diameter: 4 mm Liver: Known hepatic is not well delineated due to overlying bandaging material, bowel gas and ring down artifact from anti dependent gas within the abscess. Within normal limits in parenchymal echogenicity. Portal vein is patent on color Doppler imaging with normal direction of blood flow towards the liver. Other: None. IMPRESSION: 1. Known hepatic abscess is not well delineated due to overlying bandaging material, bowel gas and artifact from anti dependent gas within the abscess. 2. Cholelithiasis without sonographic evidence of acute cholecystitis. Electronically Signed   By: Maudry Mayhew MD   On: 10/09/2020 02:43        Scheduled Meds: . fluorometholone  1 drop Both Eyes Daily  . pantoprazole  40 mg Oral Daily  . potassium chloride  20 mEq Oral BID  . sodium chloride flush  3 mL Intravenous Q12H  . sodium chloride flush  3 mL Intravenous Q12H  . sodium chloride flush  5 mL Intracatheter Q8H   Continuous Infusions: . sodium chloride Stopped (10/09/20 0636)  . sodium chloride    . piperacillin-tazobactam Stopped (10/09/20 0636)     LOS: 1 day    Time spent: 35 minutes    Dorcas Carrow, MD [facility] Pager [phone]

## 2020-10-09 NOTE — Progress Notes (Signed)
Referring Physician(s): Allena KatzPatel, E.  Supervising Physician: Simonne ComeWatts, John  [facility]  [facility]  Chief Complaint:  S/p perihepatic abscess drain placement with Dr. Elby ShowersSuttle on 5/16  Subjective:  Pt laying bed comfortably.  No abdominal complaints, reports chronic back pain.   Allergies: Other, Ciprofloxacin, Doxycycline, Erythromycin, Hepatitis a vaccine, and Tramadol  Medications: Prior to Admission medications   Medication Sig Start Date End Date Taking? Authorizing Provider  Ascorbic Acid (VITAMIN C PO) Take 2,000 Units by mouth daily.   Yes [provider]  Calcium-Magnesium-Zinc [phone] MG TABS Take 1 tablet by mouth daily.   Yes [provider]  celecoxib (CELEBREX) 200 MG capsule Take 200 mg by mouth as directed. Take 2 capsules (400 mg) at breakfast and Take 1 capsule (200 mg) daily PRN for PAIN 07/22/18  Yes [provider]  Cholecalciferol 2000 UNITS CAPS Take 1 capsule by mouth daily.   Yes [provider]  cyanocobalamin 1000 MCG tablet Take 1 tablet by mouth daily.   Yes [provider]  fluorometholone (FML) 0.1 % ophthalmic suspension Place 1 drop into both eyes daily.  11/05/15  Yes [provider]  lactose free nutrition (BOOST) LIQD Take 237 mLs by mouth 2 (two) times daily between meals.   Yes [provider]  LORazepam (ATIVAN) 0.5 MG tablet Take 0.25 mg by mouth daily as needed for anxiety or sleep. 05/15/18  Yes [provider]  sodium chloride (OCEAN) 0.65 % SOLN nasal spray Place 1 spray into both nostrils 2 (two) times daily as needed for congestion.   Yes [provider]  aspirin EC 325 MG EC tablet Take 1 tablet (325 mg total) by mouth 2 (two) times daily after a meal. Patient not taking: No sig reported 02/19/17   Elodia FlorenceNida, Andrew, PA-C  bisacodyl (DULCOLAX) 5 MG EC tablet Take 1 tablet (5 mg total) by mouth daily as needed for moderate constipation. Patient not taking: No  sig reported 02/19/17   Elodia FlorenceNida, Andrew, PA-C  cephALEXin (KEFLEX) 500 MG capsule Take 2 capsules (1,000 mg total) by mouth 2 (two) times daily. Patient not taking: No sig reported 07/31/18   Arby BarrettePfeiffer, Marcy, MD  docusate sodium (COLACE) 100 MG capsule Take 1 capsule (100 mg total) by mouth 2 (two) times daily. Patient not taking: No sig reported 02/19/17   Elodia FlorenceNida, Andrew, PA-C  HYDROcodone-acetaminophen (NORCO/VICODIN) 5-325 MG tablet Take 1-2 tablets by mouth every 4 (four) hours as needed (breakthrough pain). Patient not taking: No sig reported 02/19/17   Elodia FlorenceNida, Andrew, PA-C  methocarbamol (ROBAXIN) 500 MG tablet Take 1 tablet (500 mg total) by mouth every 6 (six) hours as needed for muscle spasms. Patient not taking: No sig reported 02/19/17   Elodia FlorenceNida, Andrew, PA-C     Vital Signs: BP (!) 141/80 (BP Location: Left Arm)   Pulse 86   Temp 98.2 F (36.8 C) (Oral)   Resp 16   Ht 5\' 1"  (1.549 m)   Wt 154 lb (69.9 kg)   SpO2 96%   BMI 29.10 kg/m   Physical Exam Vitals reviewed.  Constitutional:      General: She is not in acute distress.    Appearance: She is not ill-appearing.  HENT:     Head: Normocephalic and atraumatic.  Pulmonary:     Effort: Pulmonary effort is normal.  Abdominal:     General: Abdomen is flat.     Palpations: Abdomen is soft.     Comments: Positive right drain  to right flank. Site is unremarkable with no erythema, edema, tenderness, bleeding or drainage. Suture and stat lock in place. Dressing is clean, dry, and intact. Trace of serosanguinous fluid noted in the suction bulb. Drain aspirates and flushes well.   Skin:    General: Skin is warm and dry.  Neurological:     Mental Status: She is alert and oriented to person, place, and time.  Psychiatric:        Mood and Affect: Mood normal.        Behavior: Behavior normal.     Imaging: CT Abdomen Pelvis Wo Contrast  Result Date: 10/08/2020 CLINICAL DATA:  Right upper quadrant pain, right flank pain. History of  retroperitoneal abscess EXAM: CT ABDOMEN AND PELVIS WITHOUT CONTRAST TECHNIQUE: Multidetector CT imaging of the abdomen and pelvis was performed following the standard protocol without IV contrast. COMPARISON:  02/21/2016 FINDINGS: Lower chest: Scarring at the right lung base. Trace right pleural effusion. Scattered calcifications in the visualized coronary arteries and the distal thoracic aorta. Hepatobiliary: Multiple gallstones fill the gallbladder. No focal hepatic abnormality. Pancreas: No focal abnormality or ductal dilatation. Spleen: No focal abnormality.  Normal size. Adrenals/Urinary Tract: No adrenal abnormality. No focal renal abnormality. No stones or hydronephrosis. Urinary bladder is unremarkable. Stomach/Bowel: Sigmoid diverticulosis. No active diverticulitis. Stomach and small bowel decompressed, unremarkable. Appendix is normal. Vascular/Lymphatic: Aortoiliac atherosclerosis. No evidence of aneurysm or adenopathy. Reproductive: Small left ovarian cyst measures 1.9 cm. Adjacent dystrophic calcifications in the left adnexa, stable. Uterus and right adnexa unremarkable. Other: There is a fluid collection in the right abdomen posterior to the right hepatic lobe and extending lateral to the right kidney which measures 6.5 x 6.2 x 4.0 cm. Locules of gas are noted within this fluid collection. Findings compatible with recurrent abscess. Musculoskeletal: No acute bony abnormality. IMPRESSION: Fluid collection in the right posterior abdomen posterior to the right hepatic lobe and lateral to the right kidney most compatible with abscess. Cholelithiasis. Trace right pleural effusion. Coronary artery disease, aortic atherosclerosis. Electronically Signed   By: Charlett Nose M.D.   On: 10/08/2020 10:51   IR Guided Horace Porteous W Catheter Placement  Result Date: 10/08/2020 INDICATION: 86 year old female with 2 weeks of right flank pain with CT findings compatible with recurrence right perihepatic fluid collection  concerning for abscess. EXAM: Ultrasound and fluoroscopic guided abscess drain placement MEDICATIONS: The patient is currently admitted to the hospital and receiving intravenous antibiotics. The antibiotics were administered within an appropriate time frame prior to the initiation of the procedure. ANESTHESIA/SEDATION: Fentanyl 50 mcg IV; Versed 1 mg IV Moderate Sedation Time:  11 The patient was continuously monitored during the procedure by the interventional radiology nurse under my direct supervision. COMPLICATIONS: None immediate. PROCEDURE: Informed written consent was obtained from the patient after a thorough discussion of the procedural risks, benefits and alternatives. All questions were addressed. Maximal Sterile Barrier Technique was utilized including caps, mask, sterile gowns, sterile gloves, sterile drape, hand hygiene and skin antiseptic. A timeout was performed prior to the initiation of the procedure. The patient was positioned in the partial left lateral cubitus position. All preprocedure ultrasound evaluation demonstrated complex perihepatic fluid collection measuring approximately 7 x 4 cm with scattered internal echogenicities. The procedure was planned. The right mid axillary area was prepped and draped in standard fashion. Subdermal Local anesthesia was provided 1% lidocaine. A small skin nick was made. Under direct ultrasound visualization, deeper local anesthetic was provided along the peritoneum. Next, under direct ultrasound visualization, a 21  gauge Chiba needle was directed into the central aspect of the fluid collection. A 0.018 inch wire was then coiled in the fluid collection, confirmed by fluoroscopy. Neck is to set was then introduced over the wire with the inner portions and wire removed. There was purulent aspirate. An Amplatz wire was then inserted to the Actiq set. Serial dilation was performed and a 10.2 French locking pigtail catheter was then placed with the pigtail portion  within the fluid collection. The catheter was affixed to the skin with an interrupted 0 Prolene suture. The catheter was placed to bulb suction. Approximately 50 mL of purulent aspirate were removed. A sample was sent to the lab for culture. The patient tolerated the procedure well was returned to the floor in good condition. IMPRESSION: Technically successful percutaneous right upper quadrant/perihepatic abscess drain placement with ultrasound and fluoroscopic guidance. Approximately 50 mL of purulent aspirate was aspirated. A sample was sent for culture. Marliss Coots, MD [facility] Radiology Electronically Signed   By: Marliss Coots MD   On: 10/08/2020 16:22   US Abdomen Limited RUQ (LIVER/GB)  Result Date: 10/09/2020 CLINICAL DATA:  Abdominal pain known liver abscess EXAM: ULTRASOUND ABDOMEN LIMITED RIGHT UPPER QUADRANT COMPARISON:  CT Oct 08, 2020. FINDINGS: Gallbladder: Multiple gallstones measuring up to 2.1 cm. No pericholecystic fluid or wall thickening visualized. No sonographic Murphy sign noted by sonographer. Common bile duct: Diameter: 4 mm Liver: Known hepatic is not well delineated due to overlying bandaging material, bowel gas and ring down artifact from anti dependent gas within the abscess. Within normal limits in parenchymal echogenicity. Portal vein is patent on color Doppler imaging with normal direction of blood flow towards the liver. Other: None. IMPRESSION: 1. Known hepatic abscess is not well delineated due to overlying bandaging material, bowel gas and artifact from anti dependent gas within the abscess. 2. Cholelithiasis without sonographic evidence of acute cholecystitis. Electronically Signed   By: Maudry Mayhew MD   On: 10/09/2020 02:43    Labs:  CBC: Recent Labs    10/08/20 0858 10/09/20 0819  WBC 9.9 8.0  HGB 13.0 13.3  HCT 37.6 39.1  PLT 315 322    COAGS: Recent Labs    10/08/20 0858  INR 1.1     BMP: Recent Labs    10/08/20 0858 10/09/20 0819  NA 123* 126*  K 4.3 3.3*  CL 87* 90*  CO2 27 28  GLUCOSE 126* 132*  BUN 7* 6*  CALCIUM 8.9 8.4*  CREATININE 0.85 0.84  GFRNONAA >60 >60    LIVER FUNCTION TESTS: Recent Labs    10/08/20 0858 10/09/20 0819  BILITOT 0.9 0.8  AST 22 19  ALT 16 17  ALKPHOS 127* 109  PROT 6.2* 5.7*  ALBUMIN 3.0* 2.5*    Assessment and Plan: 86 yo female with recurrent  perihepatic abscess. Patient was originally evaluated by [facility] in 2017 due to right retroperitoneal abscess which was treated with 2 IR drains and follow-up with Dr. Derrell Lolling. Drains were able to be removed, a recommendation was made for GI follow up with a colonoscopy to further decide on surgical planning, which she did not have a colonoscopy done nor follow-up with [facility]. patient presented to ED on 5/16 with chief complaint of intermittent right upper quadrant abdominal pain, and underwent CAT scan which revealed a recurrent posterior hepatic abscess near the right kidney.  Patient underwent image guided drain placement with IR on 10/08/2020.  Pt stable  Drain intact, flushes/aspirates  well OP 70cc VSS, afebrile CBC stable with no leukocytosis  cx pending  Continue with flushing TID, output recording q shift and dressing changes as needed. Would consider additional imaging when output is less than 10 ml for 24 hours not including flush material.    Further treatment plan per [facility] Appreciate and agree with the plan.  IR to follow.    Electronically Signed: Willette Brace, PA-C 10/09/2020, 9:50 AM   I spent a total of 15 Minutes at the the patient's bedside AND on the patient's hospital floor or unit, greater than 50% of which was counseling/coordinating care for periheatic drain

## 2020-10-09 NOTE — Progress Notes (Signed)
[facility] Progress Note     Subjective: CC-  Abdomen sore after procedure but no severe pain. Denies n/v. Tolerating diet.   Objective: Vital signs in last 24 hours: Temp:  [97.8 F (36.6 C)-99.5 F (37.5 C)] 98.2 F (36.8 C) (05/17 0457) Pulse Rate:  [76-99] 86 (05/17 0457) Resp:  [16-28] 16 (05/17 0457) BP: (104-171)/(75-101) 141/80 (05/17 0457) SpO2:  [94 %-100 %] 96 % (05/17 0457) Weight:  [69.9 kg] 69.9 kg (05/17 0500)    Intake/Output from previous day: 05/16 0701 - 05/17 0700 In: 1511.8 [P.O.:360; I.V.:1091.8; IV Piggyback:50] Out: 420 [Urine:350; Drains:70] Intake/Output this shift: No intake/output data recorded.  PE: Gen:  Alert, NAD, pleasant HEENT: EOM's intact, pupils equal and round Pulm:  rate and effort normal Abd: Soft, NT/ND, +BS, no HSM, no hernia, IR drain with somewhat cloudy fluid in bulb Skin: warm and dry  Lab Results:  Recent Labs    10/08/20 0858 10/09/20 0819  WBC 9.9 8.0  HGB 13.0 13.3  HCT 37.6 39.1  PLT 315 322   BMET Recent Labs    10/08/20 0858 10/09/20 0819  NA 123* 126*  K 4.3 3.3*  CL 87* 90*  CO2 27 28  GLUCOSE 126* 132*  BUN 7* 6*  CREATININE 0.85 0.84  CALCIUM 8.9 8.4*   PT/INR Recent Labs    10/08/20 0858  LABPROT 13.7  INR 1.1   CMP     Component Value Date/Time   NA 126 (L) 10/09/2020 0819   K 3.3 (L) 10/09/2020 0819   CL 90 (L) 10/09/2020 0819   CO2 28 10/09/2020 0819   GLUCOSE 132 (H) 10/09/2020 0819   BUN 6 (L) 10/09/2020 0819   CREATININE 0.84 10/09/2020 0819   CALCIUM 8.4 (L) 10/09/2020 0819   PROT 5.7 (L) 10/09/2020 0819   ALBUMIN 2.5 (L) 10/09/2020 0819   AST 19 10/09/2020 0819   ALT 17 10/09/2020 0819   ALKPHOS 109 10/09/2020 0819   BILITOT 0.8 10/09/2020 0819   GFRNONAA >60 10/09/2020 0819   GFRAA >60 07/31/2018 1508   Lipase     Component Value Date/Time   LIPASE 29 10/08/2020 0858       Studies/Results: CT Abdomen Pelvis Wo Contrast  Result Date:  10/08/2020 CLINICAL DATA:  Right upper quadrant pain, right flank pain. History of retroperitoneal abscess EXAM: CT ABDOMEN AND PELVIS WITHOUT CONTRAST TECHNIQUE: Multidetector CT imaging of the abdomen and pelvis was performed following the standard protocol without IV contrast. COMPARISON:  02/21/2016 FINDINGS: Lower chest: Scarring at the right lung base. Trace right pleural effusion. Scattered calcifications in the visualized coronary arteries and the distal thoracic aorta. Hepatobiliary: Multiple gallstones fill the gallbladder. No focal hepatic abnormality. Pancreas: No focal abnormality or ductal dilatation. Spleen: No focal abnormality.  Normal size. Adrenals/Urinary Tract: No adrenal abnormality. No focal renal abnormality. No stones or hydronephrosis. Urinary bladder is unremarkable. Stomach/Bowel: Sigmoid diverticulosis. No active diverticulitis. Stomach and small bowel decompressed, unremarkable. Appendix is normal. Vascular/Lymphatic: Aortoiliac atherosclerosis. No evidence of aneurysm or adenopathy. Reproductive: Small left ovarian cyst measures 1.9 cm. Adjacent dystrophic calcifications in the left adnexa, stable. Uterus and right adnexa unremarkable. Other: There is a fluid collection in the right abdomen posterior to the right hepatic lobe and extending lateral to the right kidney which measures 6.5 x 6.2 x 4.0 cm. Locules of gas are noted within this fluid collection. Findings compatible with recurrent abscess. Musculoskeletal: No acute bony abnormality. IMPRESSION: Fluid collection in the right posterior abdomen posterior  to the right hepatic lobe and lateral to the right kidney most compatible with abscess. Cholelithiasis. Trace right pleural effusion. Coronary artery disease, aortic atherosclerosis. Electronically Signed   By: Charlett Nose M.D.   On: 10/08/2020 10:51   IR Guided Horace Porteous W Catheter Placement  Result Date: 10/08/2020 INDICATION: 86 year old female with 2 weeks of right flank  pain with CT findings compatible with recurrence right perihepatic fluid collection concerning for abscess. EXAM: Ultrasound and fluoroscopic guided abscess drain placement MEDICATIONS: The patient is currently admitted to the hospital and receiving intravenous antibiotics. The antibiotics were administered within an appropriate time frame prior to the initiation of the procedure. ANESTHESIA/SEDATION: Fentanyl 50 mcg IV; Versed 1 mg IV Moderate Sedation Time:  11 The patient was continuously monitored during the procedure by the interventional radiology nurse under my direct supervision. COMPLICATIONS: None immediate. PROCEDURE: Informed written consent was obtained from the patient after a thorough discussion of the procedural risks, benefits and alternatives. All questions were addressed. Maximal Sterile Barrier Technique was utilized including caps, mask, sterile gowns, sterile gloves, sterile drape, hand hygiene and skin antiseptic. A timeout was performed prior to the initiation of the procedure. The patient was positioned in the partial left lateral cubitus position. All preprocedure ultrasound evaluation demonstrated complex perihepatic fluid collection measuring approximately 7 x 4 cm with scattered internal echogenicities. The procedure was planned. The right mid axillary area was prepped and draped in standard fashion. Subdermal Local anesthesia was provided 1% lidocaine. A small skin nick was made. Under direct ultrasound visualization, deeper local anesthetic was provided along the peritoneum. Next, under direct ultrasound visualization, a 21 gauge Chiba needle was directed into the central aspect of the fluid collection. A 0.018 inch wire was then coiled in the fluid collection, confirmed by fluoroscopy. Neck is to set was then introduced over the wire with the inner portions and wire removed. There was purulent aspirate. An Amplatz wire was then inserted to the Actiq set. Serial dilation was performed  and a 10.2 French locking pigtail catheter was then placed with the pigtail portion within the fluid collection. The catheter was affixed to the skin with an interrupted 0 Prolene suture. The catheter was placed to bulb suction. Approximately 50 mL of purulent aspirate were removed. A sample was sent to the lab for culture. The patient tolerated the procedure well was returned to the floor in good condition. IMPRESSION: Technically successful percutaneous right upper quadrant/perihepatic abscess drain placement with ultrasound and fluoroscopic guidance. Approximately 50 mL of purulent aspirate was aspirated. A sample was sent for culture. Marliss Coots, MD [facility] Radiology Electronically Signed   By: Marliss Coots MD   On: 10/08/2020 16:22   US Abdomen Limited RUQ (LIVER/GB)  Result Date: 10/09/2020 CLINICAL DATA:  Abdominal pain known liver abscess EXAM: ULTRASOUND ABDOMEN LIMITED RIGHT UPPER QUADRANT COMPARISON:  CT Oct 08, 2020. FINDINGS: Gallbladder: Multiple gallstones measuring up to 2.1 cm. No pericholecystic fluid or wall thickening visualized. No sonographic Murphy sign noted by sonographer. Common bile duct: Diameter: 4 mm Liver: Known hepatic is not well delineated due to overlying bandaging material, bowel gas and ring down artifact from anti dependent gas within the abscess. Within normal limits in parenchymal echogenicity. Portal vein is patent on color Doppler imaging with normal direction of blood flow towards the liver. Other: None. IMPRESSION: 1. Known hepatic abscess is not well delineated due to overlying bandaging material, bowel gas and artifact from anti dependent gas within the abscess.  2. Cholelithiasis without sonographic evidence of acute cholecystitis. Electronically Signed   By: Maudry Mayhew MD   On: 10/09/2020 02:43    Anti-infectives: Anti-infectives (From admission, onward)   Start     Dose/Rate Route Frequency Ordered  Stop   10/08/20 2200  piperacillin-tazobactam (ZOSYN) IVPB 3.375 g        3.375 g 12.5 mL/hr over 240 Minutes Intravenous Every 8 hours 10/08/20 1111     10/08/20 1115  piperacillin-tazobactam (ZOSYN) IVPB 3.375 g        3.375 g 100 mL/hr over 30 Minutes Intravenous  Once 10/08/20 1113 10/08/20 1228       Assessment/Plan Acute on chronic hyponatremia  CBP Polymyalgia rheumatica Fibromyalgia HTN GERD  Recurrent posterior hepatic/RTP abscess, unclear etiology S/p IR drain placement 5/16 - The patient has a recurrent abscess similar to 5 years ago.  Her appendix is normal on this scan and this is very high and posterior to think this is related to her appendix.  No obvious colon masses noted, but patient still needs a GI evaluation at some point to rule out colonic etiology - likely after discharge. Appreciate primary team assistance with any further work up of etiology. - cx pending - continue IV antibiotics and follow culture - We will follow. Patient likely can transition to oral antibiotics and go home with the drain in the next few days.   FEN - soft diet VTE - ok for prophylaxis from our standpoint ID - [email or web address] 5/16 >>   LOS: 1 day    Franne Forts, [facility] 10/09/2020, 10:08 AM Please see Amion for pager number during day hours 7:00am-4:30pm

## 2020-10-10 DIAGNOSIS — K6819 Other retroperitoneal abscess: Secondary | ICD-10-CM | POA: Diagnosis not present

## 2020-10-10 LAB — CBC WITH DIFFERENTIAL/PLATELET
Abs Immature Granulocytes: 0.04 10*3/uL (ref 0.00–0.07)
Basophils Absolute: 0 10*3/uL (ref 0.0–0.1)
Basophils Relative: 1 %
Eosinophils Absolute: 0.2 10*3/uL (ref 0.0–0.5)
Eosinophils Relative: 4 %
HCT: 33.7 % — ABNORMAL LOW (ref 36.0–46.0)
Hemoglobin: 11.1 g/dL — ABNORMAL LOW (ref 12.0–15.0)
Immature Granulocytes: 1 %
Lymphocytes Relative: 17 %
Lymphs Abs: 0.9 10*3/uL (ref 0.7–4.0)
MCH: 29.1 pg (ref 26.0–34.0)
MCHC: 32.9 g/dL (ref 30.0–36.0)
MCV: 88.5 fL (ref 80.0–100.0)
Monocytes Absolute: 0.4 10*3/uL (ref 0.1–1.0)
Monocytes Relative: 8 %
Neutro Abs: 3.8 10*3/uL (ref 1.7–7.7)
Neutrophils Relative %: 69 %
Platelets: 282 10*3/uL (ref 150–400)
RBC: 3.81 MIL/uL — ABNORMAL LOW (ref 3.87–5.11)
RDW: 12.5 % (ref 11.5–15.5)
WBC: 5.5 10*3/uL (ref 4.0–10.5)
nRBC: 0 % (ref 0.0–0.2)

## 2020-10-10 LAB — COMPREHENSIVE METABOLIC PANEL
ALT: 15 U/L (ref 0–44)
AST: 18 U/L (ref 15–41)
Albumin: 2.3 g/dL — ABNORMAL LOW (ref 3.5–5.0)
Alkaline Phosphatase: 90 U/L (ref 38–126)
Anion gap: 6 (ref 5–15)
BUN: 9 mg/dL (ref 8–23)
CO2: 25 mmol/L (ref 22–32)
Calcium: 8.3 mg/dL — ABNORMAL LOW (ref 8.9–10.3)
Chloride: 94 mmol/L — ABNORMAL LOW (ref 98–111)
Creatinine, Ser: 0.81 mg/dL (ref 0.44–1.00)
GFR, Estimated: >60 mL/min (ref >60)
Glucose, Bld: 96 mg/dL (ref 70–99)
Potassium: 4 mmol/L (ref 3.5–5.1)
Sodium: 125 mmol/L — ABNORMAL LOW (ref 135–145)
Total Bilirubin: 0.2 mg/dL — ABNORMAL LOW (ref 0.3–1.2)
Total Protein: 5.1 g/dL — ABNORMAL LOW (ref 6.5–8.1)

## 2020-10-10 LAB — PHOSPHORUS: Phosphorus: 3.2 mg/dL (ref 2.5–4.6)

## 2020-10-10 LAB — MAGNESIUM: Magnesium: 1.4 mg/dL — ABNORMAL LOW (ref 1.7–2.4)

## 2020-10-10 MED ORDER — MAGNESIUM SULFATE 2 GM/50ML IV SOLN
2.0000 g | Freq: Once | INTRAVENOUS | Status: AC
  Administered 2020-10-10: 2 g via INTRAVENOUS
  Filled 2020-10-10: qty 50

## 2020-10-10 MED ORDER — MAGNESIUM GLUCONATE 500 MG PO TABS
500.0000 mg | ORAL_TABLET | Freq: Every day | ORAL | Status: DC
  Administered 2020-10-10 – 2020-10-12 (×3): 500 mg via ORAL
  Filled 2020-10-10 (×3): qty 1

## 2020-10-10 MED ORDER — ENOXAPARIN SODIUM 40 MG/0.4ML IJ SOSY
40.0000 mg | PREFILLED_SYRINGE | Freq: Every day | INTRAMUSCULAR | Status: DC
  Administered 2020-10-10 – 2020-10-12 (×3): 40 mg via SUBCUTANEOUS
  Filled 2020-10-10 (×3): qty 0.4

## 2020-10-10 NOTE — Progress Notes (Signed)
This nurse was doing skin assessment for Pressure Injury Prevalence (pt has no pressure injuries) and noted her lower back to be red in color. In discussing this with the pt and her husband, I asked if she used a heating pad at home ( because this is what it looked like to me) and she said Yes she did use a heating pad at home. Her husband confirmed this and said he really believed that the redness was from the heating pad.  There is no breakdown there in the skin but I encouraged her to use a layer in between the heating pad and her skin.

## 2020-10-10 NOTE — Progress Notes (Signed)
Referring Physician(s): Allena Katz, E.  Supervising Physician: Marliss Coots  Patient Status:  [facility] - In-pt  Chief Complaint:  S/p perihepatic abscess drain placement with Dr. Elby Showers on 5/16  Subjective:  Pt laying bed comfortably, husband at the bedside.  No abdominal complaints.   Allergies: Other, Ciprofloxacin, Doxycycline, Erythromycin, Hepatitis a vaccine, and Tramadol  Medications: Prior to Admission medications   Medication Sig Start Date End Date Taking? Authorizing Provider  Ascorbic Acid (VITAMIN C PO) Take 2,000 Units by mouth daily.   Yes [provider]  Calcium-Magnesium-Zinc [phone] MG TABS Take 1 tablet by mouth daily.   Yes [provider]  celecoxib (CELEBREX) 200 MG capsule Take 200 mg by mouth as directed. Take 2 capsules (400 mg) at breakfast and Take 1 capsule (200 mg) daily PRN for PAIN 07/22/18  Yes [provider]  Cholecalciferol 2000 UNITS CAPS Take 1 capsule by mouth daily.   Yes [provider]  cyanocobalamin 1000 MCG tablet Take 1 tablet by mouth daily.   Yes [provider]  fluorometholone (FML) 0.1 % ophthalmic suspension Place 1 drop into both eyes daily.  11/05/15  Yes [provider]  lactose free nutrition (BOOST) LIQD Take 237 mLs by mouth 2 (two) times daily between meals.   Yes [provider]  LORazepam (ATIVAN) 0.5 MG tablet Take 0.25 mg by mouth daily as needed for anxiety or sleep. 05/15/18  Yes [provider]  sodium chloride (OCEAN) 0.65 % SOLN nasal spray Place 1 spray into both nostrils 2 (two) times daily as needed for congestion.   Yes [provider]  aspirin EC 325 MG EC tablet Take 1 tablet (325 mg total) by mouth 2 (two) times daily after a meal. Patient not taking: No sig reported 02/19/17   Elodia Florence, PA-C  bisacodyl (DULCOLAX) 5 MG EC tablet Take 1 tablet (5 mg total) by mouth daily as needed for moderate constipation. Patient not taking: No  sig reported 02/19/17   Elodia Florence, PA-C  cephALEXin (KEFLEX) 500 MG capsule Take 2 capsules (1,000 mg total) by mouth 2 (two) times daily. Patient not taking: No sig reported 07/31/18   Arby Barrette, MD  docusate sodium (COLACE) 100 MG capsule Take 1 capsule (100 mg total) by mouth 2 (two) times daily. Patient not taking: No sig reported 02/19/17   Elodia Florence, PA-C  HYDROcodone-acetaminophen (NORCO/VICODIN) 5-325 MG tablet Take 1-2 tablets by mouth every 4 (four) hours as needed (breakthrough pain). Patient not taking: No sig reported 02/19/17   Elodia Florence, PA-C  methocarbamol (ROBAXIN) 500 MG tablet Take 1 tablet (500 mg total) by mouth every 6 (six) hours as needed for muscle spasms. Patient not taking: No sig reported 02/19/17   Elodia Florence, PA-C     Vital Signs: BP (!) 145/79 (BP Location: Left Arm)   Pulse 76   Temp 97.7 F (36.5 C) (Oral)   Resp 17   Ht 5\' 1"  (1.549 m)   Wt 154 lb (69.9 kg)   SpO2 98%   BMI 29.10 kg/m   Physical Exam Vitals reviewed.  Constitutional:      General: She is not in acute distress.    Appearance: She is not ill-appearing.  HENT:     Head: Normocephalic and atraumatic.  Pulmonary:     Effort: Pulmonary effort is normal.  Abdominal:     General: Abdomen is flat.     Palpations: Abdomen is soft.     Comments: Positive right drain  to right flank. Site is unremarkable with no erythema, edema, tenderness, bleeding or drainage. Suture and stat lock in place. Dressing is clean, dry, and intact. 5 mL of serosanguinous fluid noted in the suction bulb. Drain aspirates and flushes well.   Skin:    General: Skin is warm and dry.  Neurological:     Mental Status: She is alert and oriented to person, place, and time.  Psychiatric:        Mood and Affect: Mood normal.        Behavior: Behavior normal.     Imaging: CT Abdomen Pelvis Wo Contrast  Result Date: 10/08/2020 CLINICAL DATA:  Right upper quadrant pain, right flank pain. History of  retroperitoneal abscess EXAM: CT ABDOMEN AND PELVIS WITHOUT CONTRAST TECHNIQUE: Multidetector CT imaging of the abdomen and pelvis was performed following the standard protocol without IV contrast. COMPARISON:  02/21/2016 FINDINGS: Lower chest: Scarring at the right lung base. Trace right pleural effusion. Scattered calcifications in the visualized coronary arteries and the distal thoracic aorta. Hepatobiliary: Multiple gallstones fill the gallbladder. No focal hepatic abnormality. Pancreas: No focal abnormality or ductal dilatation. Spleen: No focal abnormality.  Normal size. Adrenals/Urinary Tract: No adrenal abnormality. No focal renal abnormality. No stones or hydronephrosis. Urinary bladder is unremarkable. Stomach/Bowel: Sigmoid diverticulosis. No active diverticulitis. Stomach and small bowel decompressed, unremarkable. Appendix is normal. Vascular/Lymphatic: Aortoiliac atherosclerosis. No evidence of aneurysm or adenopathy. Reproductive: Small left ovarian cyst measures 1.9 cm. Adjacent dystrophic calcifications in the left adnexa, stable. Uterus and right adnexa unremarkable. Other: There is a fluid collection in the right abdomen posterior to the right hepatic lobe and extending lateral to the right kidney which measures 6.5 x 6.2 x 4.0 cm. Locules of gas are noted within this fluid collection. Findings compatible with recurrent abscess. Musculoskeletal: No acute bony abnormality. IMPRESSION: Fluid collection in the right posterior abdomen posterior to the right hepatic lobe and lateral to the right kidney most compatible with abscess. Cholelithiasis. Trace right pleural effusion. Coronary artery disease, aortic atherosclerosis. Electronically Signed   By: Charlett Nose M.D.   On: 10/08/2020 10:51   IR Guided Horace Porteous W Catheter Placement  Result Date: 10/08/2020 INDICATION: 86 year old female with 2 weeks of right flank pain with CT findings compatible with recurrence right perihepatic fluid collection  concerning for abscess. EXAM: Ultrasound and fluoroscopic guided abscess drain placement MEDICATIONS: The patient is currently admitted to the hospital and receiving intravenous antibiotics. The antibiotics were administered within an appropriate time frame prior to the initiation of the procedure. ANESTHESIA/SEDATION: Fentanyl 50 mcg IV; Versed 1 mg IV Moderate Sedation Time:  11 The patient was continuously monitored during the procedure by the interventional radiology nurse under my direct supervision. COMPLICATIONS: None immediate. PROCEDURE: Informed written consent was obtained from the patient after a thorough discussion of the procedural risks, benefits and alternatives. All questions were addressed. Maximal Sterile Barrier Technique was utilized including caps, mask, sterile gowns, sterile gloves, sterile drape, hand hygiene and skin antiseptic. A timeout was performed prior to the initiation of the procedure. The patient was positioned in the partial left lateral cubitus position. All preprocedure ultrasound evaluation demonstrated complex perihepatic fluid collection measuring approximately 7 x 4 cm with scattered internal echogenicities. The procedure was planned. The right mid axillary area was prepped and draped in standard fashion. Subdermal Local anesthesia was provided 1% lidocaine. A small skin nick was made. Under direct ultrasound visualization, deeper local anesthetic was provided along the peritoneum. Next, under direct ultrasound visualization, a  21 gauge Chiba needle was directed into the central aspect of the fluid collection. A 0.018 inch wire was then coiled in the fluid collection, confirmed by fluoroscopy. Neck is to set was then introduced over the wire with the inner portions and wire removed. There was purulent aspirate. An Amplatz wire was then inserted to the Actiq set. Serial dilation was performed and a 10.2 French locking pigtail catheter was then placed with the pigtail portion  within the fluid collection. The catheter was affixed to the skin with an interrupted 0 Prolene suture. The catheter was placed to bulb suction. Approximately 50 mL of purulent aspirate were removed. A sample was sent to the lab for culture. The patient tolerated the procedure well was returned to the floor in good condition. IMPRESSION: Technically successful percutaneous right upper quadrant/perihepatic abscess drain placement with ultrasound and fluoroscopic guidance. Approximately 50 mL of purulent aspirate was aspirated. A sample was sent for culture. Marliss Coots, MD [facility] Radiology Electronically Signed   By: Marliss Coots MD   On: 10/08/2020 16:22   US Abdomen Limited RUQ (LIVER/GB)  Result Date: 10/09/2020 CLINICAL DATA:  Abdominal pain known liver abscess EXAM: ULTRASOUND ABDOMEN LIMITED RIGHT UPPER QUADRANT COMPARISON:  CT Oct 08, 2020. FINDINGS: Gallbladder: Multiple gallstones measuring up to 2.1 cm. No pericholecystic fluid or wall thickening visualized. No sonographic Murphy sign noted by sonographer. Common bile duct: Diameter: 4 mm Liver: Known hepatic is not well delineated due to overlying bandaging material, bowel gas and ring down artifact from anti dependent gas within the abscess. Within normal limits in parenchymal echogenicity. Portal vein is patent on color Doppler imaging with normal direction of blood flow towards the liver. Other: None. IMPRESSION: 1. Known hepatic abscess is not well delineated due to overlying bandaging material, bowel gas and artifact from anti dependent gas within the abscess. 2. Cholelithiasis without sonographic evidence of acute cholecystitis. Electronically Signed   By: Maudry Mayhew MD   On: 10/09/2020 02:43    Labs:  CBC: Recent Labs    10/08/20 0858 10/09/20 0819 10/10/20 0313  WBC 9.9 8.0 5.5  HGB 13.0 13.3 11.1*  HCT 37.6 39.1 33.7*  PLT 315 322 282    COAGS: Recent Labs     10/08/20 0858  INR 1.1    BMP: Recent Labs    10/08/20 0858 10/09/20 0819 10/10/20 0313  NA 123* 126* 125*  K 4.3 3.3* 4.0  CL 87* 90* 94*  CO2 27 28 25   GLUCOSE 126* 132* 96  BUN 7* 6* 9  CALCIUM 8.9 8.4* 8.3*  CREATININE 0.85 0.84 0.81  GFRNONAA >60 >60 >60    LIVER FUNCTION TESTS: Recent Labs    10/08/20 0858 10/09/20 0819 10/10/20 0313  BILITOT 0.9 0.8 0.2*  AST 22 19 18   ALT 16 17 15   ALKPHOS 127* 109 90  PROT 6.2* 5.7* 5.1*  ALBUMIN 3.0* 2.5* 2.3*    Assessment and Plan: 86 yo female with recurrent  perihepatic abscess. Patient was originally evaluated by [facility] in 2017 due to right retroperitoneal abscess which was treated with 2 IR drains and follow-up with Dr. . Drains were able to be removed, a recommendation was made for GI follow up with a colonoscopy to further decide on surgical planning, which she did not have a colonoscopy done nor follow-up with [facility]. patient presented to ED on 5/16 with chief complaint of intermittent right upper quadrant abdominal pain, and underwent CAT scan which revealed a  recurrent posterior hepatic abscess near the right kidney.  Patient underwent image guided drain placement with IR on 10/08/2020.  Pt stable  Drain intact, flushes/aspirates well OP 65cc VSS, afebrile CBC stable with no leukocytosis  cx pending  Continue with flushing TID, output recording q shift and dressing changes as needed. Would consider additional imaging when output is less than 10 ml for 24 hours not including flush material.   Patient states that she might be discharge tomorrow.  If patient were to be disharged before the drain can be removed in the hospital, our clinic will call patient to set up a follow up appointment for CT scan and possible drain injection at the clinic 1-2 weeks after the discharge.  Out patient follow up orders in.    Out patient discharge instruction for the drain and dressing:    ? Patient to flush the drain with 5 cc  normal saline daily ? Record output daily (subtract 5 cc that was used to flush the drain from the total daily output)  ? Dressing change as needed and at least once a week.      Further treatment plan per [facility] Appreciate and agree with the plan.  IR to follow.    Electronically Signed: Willette BraceAimee H Coy [place]och[place], PA-C 10/10/2020, 12:01 PM   I spent a total of 15 Minutes at the the patient's bedside AND on the patient's hospital floor or unit, greater than 50% of which was counseling/coordinating care for periheatic drain

## 2020-10-10 NOTE — Progress Notes (Signed)
Shannon Montgomery  TWS:[id number] DOB: 12/12/1934 DOA: 10/08/2020 PCP: Pearson Grippe, MD    Brief Narrative:  86 year old with a history of HTN and perihepatic abscess drained 5 years ago who presented to the hospital with 2 weeks of severe right flank pain.  Work-up in the ER revealed a retrohepatic abscess.  She was admitted with acute units in IR performed drainage of this abscess.  Consultants:  IR General surgery  Code Status: FULL CODE  Antimicrobials:  Zosyn 5/16 >  DVT prophylaxis: Lovenox  Subjective: Afebrile.  Vital signs stable.  In good spirits.  Has no new complaints.  Denies chest pain nausea vomiting or abdominal pain.  Assessment & Plan:  Recurrent posterior hepatic retroperitoneal abscess Etiology unclear -asymptomatic for last 5 years -status post percutaneous drain placement by IR - previous abscess 2017 positive for Streptococcus -blood cultures currently negative -patient refuses colonoscopy -anticipate discharge home with oral antibiotics and drain care -abscess culture now revealing Klebsiella pneumoniae which is ampicillin resistant but otherwise broadly sensitive  Hyponatremia Suspect due to simple hypovolemia -stable  Hypokalemia Corrected with supplementation  Hypomagnesemia Replace and follow  HTN Blood pressure presently well controlled   Family Communication:  Status is: Inpatient  Remains inpatient appropriate because:Inpatient level of care appropriate due to severity of illness   Dispo: The patient is from: Home              Anticipated d/c is to: Home              Patient currently is not medically stable to d/c.   Difficult to place patient No   Objective: Blood pressure (!) 145/79, pulse 76, temperature 97.7 F (36.5 C), temperature source Oral, resp. rate 17, height 5\' 1"  (1.549 m), weight 69.9 kg, SpO2 98 %.  Intake/Output Summary (Last 24 hours) at 10/10/2020 1039 Last data filed at 10/10/2020 0900 Gross per 24 hour  Intake  2465.17 ml  Output 850 ml  Net 1615.17 ml   Filed Weights   10/08/20 0850 10/09/20 0500  Weight: 68 kg 69.9 kg    Examination: General: No acute respiratory distress Lungs: Clear to auscultation bilaterally without wheezes or crackles Cardiovascular: Regular rate and rhythm without murmur gallop or rub normal S1 and S2 Abdomen: Nontender, nondistended, soft, bowel sounds positive, no rebound, no ascites, no appreciable mass Extremities: No significant cyanosis, clubbing, or edema bilateral lower extremities  CBC: Recent Labs  Lab 10/08/20 0858 10/09/20 0819 10/10/20 0313  WBC 9.9 8.0 5.5  NEUTROABS 8.5* 6.8 3.8  HGB 13.0 13.3 11.1*  HCT 37.6 39.1 33.7*  MCV 85.5 86.1 88.5  PLT 315 322 282   Basic Metabolic Panel: Recent Labs  Lab 10/08/20 0858 10/09/20 0819 10/10/20 0313  NA 123* 126* 125*  K 4.3 3.3* 4.0  CL 87* 90* 94*  CO2 27 28 25   GLUCOSE 126* 132* 96  BUN 7* 6* 9  CREATININE 0.85 0.84 0.81  CALCIUM 8.9 8.4* 8.3*  MG  --   --  1.4*  PHOS  --   --  3.2   GFR: Estimated Creatinine Clearance: 46.2 mL/min (by C-G formula based on SCr of 0.81 mg/dL).  Liver Function Tests: Recent Labs  Lab 10/08/20 0858 10/09/20 0819 10/10/20 0313  AST 22 19 18   ALT 16 17 15   ALKPHOS 127* 109 90  BILITOT 0.9 0.8 0.2*  PROT 6.2* 5.7* 5.1*  ALBUMIN 3.0* 2.5* 2.3*   Recent Labs  Lab 10/08/20 0858  LIPASE 29  Coagulation Profile: Recent Labs  Lab 10/08/20 0858  INR 1.1    Recent Results (from the past 240 hour(s))  Blood culture (routine x 2)     Status: None (Preliminary result)   Collection Time: 10/08/20 11:20 AM   Specimen: BLOOD LEFT WRIST  Result Value Ref Range Status   Specimen Description BLOOD LEFT WRIST  Final   Special Requests   Final    BOTTLES DRAWN AEROBIC AND ANAEROBIC Blood Culture results may not be optimal due to an inadequate volume of blood received in culture bottles   Culture   Final    NO GROWTH 2 DAYS Performed at [facility], [address], [place], [place] [address]    Report Status PENDING  Incomplete  Blood culture (routine x 2)     Status: None (Preliminary result)   Collection Time: 10/08/20 11:25 AM   Specimen: BLOOD  Result Value Ref Range Status   Specimen Description BLOOD RIGHT ANTECUBITAL  Final   Special Requests   Final    BOTTLES DRAWN AEROBIC AND ANAEROBIC Blood Culture results may not be optimal due to an inadequate volume of blood received in culture bottles   Culture   Final    NO GROWTH 2 DAYS Performed at [facility], [address] [place], [place] [address]    Report Status PENDING  Incomplete  Aerobic/Anaerobic Culture (surgical/deep wound)     Status: None (Preliminary result)   Collection Time: 10/08/20  4:04 PM   Specimen: Liver; Abscess  Result Value Ref Range Status   Specimen Description LIVER  Final   Special Requests NONE  Final   Gram Stain   Final    ABUNDANT WBC PRESENT, PREDOMINANTLY PMN MODERATE GRAM POSITIVE COCCI FEW GRAM NEGATIVE RODS    Culture   Final    MODERATE KLEBSIELLA PNEUMONIAE SUSCEPTIBILITIES TO FOLLOW Performed at [facility], [address] [place], [place] [address]    Report Status PENDING  Incomplete     Scheduled Meds: . fluorometholone  1 drop Both Eyes Daily  . pantoprazole  40 mg Oral Daily  . potassium chloride  20 mEq Oral BID  . sodium chloride flush  3 mL Intravenous Q12H  . sodium chloride flush  3 mL Intravenous Q12H  . sodium chloride flush  5 mL Intracatheter Q8H   Continuous Infusions: . sodium chloride 75 mL/hr at 10/10/20 0432  . sodium chloride    . piperacillin-tazobactam 3.375 g (10/10/20 0434)     LOS: 2 days   Lonia Blood, MD [facility] Office  [phone] Pager - Text Page per Amion  If 7PM-7AM, please contact night-coverage per Amion 10/10/2020, 10:39 AM

## 2020-10-11 DIAGNOSIS — K6819 Other retroperitoneal abscess: Secondary | ICD-10-CM | POA: Diagnosis not present

## 2020-10-11 LAB — CBC WITH DIFFERENTIAL/PLATELET
Abs Immature Granulocytes: 0.02 10*3/uL (ref 0.00–0.07)
Basophils Absolute: 0 10*3/uL (ref 0.0–0.1)
Basophils Relative: 1 %
Eosinophils Absolute: 0.2 10*3/uL (ref 0.0–0.5)
Eosinophils Relative: 5 %
HCT: 36.5 % (ref 36.0–46.0)
Hemoglobin: 12.4 g/dL (ref 12.0–15.0)
Immature Granulocytes: 0 %
Lymphocytes Relative: 14 %
Lymphs Abs: 0.7 10*3/uL (ref 0.7–4.0)
MCH: 29.2 pg (ref 26.0–34.0)
MCHC: 34 g/dL (ref 30.0–36.0)
MCV: 86.1 fL (ref 80.0–100.0)
Monocytes Absolute: 0.4 10*3/uL (ref 0.1–1.0)
Monocytes Relative: 9 %
Neutro Abs: 3.4 10*3/uL (ref 1.7–7.7)
Neutrophils Relative %: 71 %
Platelets: 328 10*3/uL (ref 150–400)
RBC: 4.24 MIL/uL (ref 3.87–5.11)
RDW: 12.5 % (ref 11.5–15.5)
WBC: 4.7 10*3/uL (ref 4.0–10.5)
nRBC: 0 % (ref 0.0–0.2)

## 2020-10-11 LAB — COMPREHENSIVE METABOLIC PANEL
ALT: 17 U/L (ref 0–44)
AST: 19 U/L (ref 15–41)
Albumin: 2.5 g/dL — ABNORMAL LOW (ref 3.5–5.0)
Alkaline Phosphatase: 85 U/L (ref 38–126)
Anion gap: 7 (ref 5–15)
BUN: 8 mg/dL (ref 8–23)
CO2: 27 mmol/L (ref 22–32)
Calcium: 8.4 mg/dL — ABNORMAL LOW (ref 8.9–10.3)
Chloride: 94 mmol/L — ABNORMAL LOW (ref 98–111)
Creatinine, Ser: 0.81 mg/dL (ref 0.44–1.00)
GFR, Estimated: >60 mL/min (ref >60)
Glucose, Bld: 144 mg/dL — ABNORMAL HIGH (ref 70–99)
Potassium: 3.7 mmol/L (ref 3.5–5.1)
Sodium: 128 mmol/L — ABNORMAL LOW (ref 135–145)
Total Bilirubin: 0.3 mg/dL (ref 0.3–1.2)
Total Protein: 5.6 g/dL — ABNORMAL LOW (ref 6.5–8.1)

## 2020-10-11 LAB — MAGNESIUM: Magnesium: 1.7 mg/dL (ref 1.7–2.4)

## 2020-10-11 MED ORDER — GUAIFENESIN-DM 100-10 MG/5ML PO SYRP
5.0000 mL | ORAL_SOLUTION | ORAL | Status: DC | PRN
  Administered 2020-10-11 (×2): 5 mL via ORAL
  Filled 2020-10-11 (×2): qty 5

## 2020-10-11 MED ORDER — SODIUM CHLORIDE 0.9 % IV SOLN
3.0000 g | Freq: Four times a day (QID) | INTRAVENOUS | Status: DC
  Administered 2020-10-11 – 2020-10-12 (×4): 3 g via INTRAVENOUS
  Filled 2020-10-11 (×5): qty 8
  Filled 2020-10-11: qty 3

## 2020-10-11 NOTE — Progress Notes (Signed)
[facility] Progress Note     Subjective: CC-  Feeling well this morning. Sitting up in bed eating breakfast. Abdominal pain essentially resolved. Denies n/v. BM yesterday. WBC 4.7, afebrile  Objective: Vital signs in last 24 hours: Temp:  [98 F (36.7 C)-98.3 F (36.8 C)] 98.2 F (36.8 C) (05/19 0550) Pulse Rate:  [78-85] 85 (05/19 0550) Resp:  [15-16] 15 (05/19 0550) BP: (147-178)/(59-97) 178/97 (05/19 0550) SpO2:  [93 %-99 %] 99 % (05/19 0550) Last BM Date:  (PTA)  Intake/Output from previous day: 05/18 0701 - 05/19 0700 In: 535 [P.O.:520] Out: 3175 [Urine:3150; Drains:25] Intake/Output this shift: No intake/output data recorded.  PE: Gen:  Alert, NAD, pleasant Pulm:  rate and effort normal Abd: Soft, NT/ND, +BS, no HSM, no hernia, IR drain with somewhat cloudy fluid in bulb Skin: warm and dry   Lab Results:  Recent Labs    10/10/20 0313 10/11/20 0312  WBC 5.5 4.7  HGB 11.1* 12.4  HCT 33.7* 36.5  PLT 282 328   BMET Recent Labs    10/10/20 0313 10/11/20 0312  NA 125* 128*  K 4.0 3.7  CL 94* 94*  CO2 25 27  GLUCOSE 96 144*  BUN 9 8  CREATININE 0.81 0.81  CALCIUM 8.3* 8.4*   PT/INR Recent Labs    10/08/20 0858  LABPROT 13.7  INR 1.1   CMP     Component Value Date/Time   NA 128 (L) 10/11/2020 0312   K 3.7 10/11/2020 0312   CL 94 (L) 10/11/2020 0312   CO2 27 10/11/2020 0312   GLUCOSE 144 (H) 10/11/2020 0312   BUN 8 10/11/2020 0312   CREATININE 0.81 10/11/2020 0312   CALCIUM 8.4 (L) 10/11/2020 0312   PROT 5.6 (L) 10/11/2020 0312   ALBUMIN 2.5 (L) 10/11/2020 0312   AST 19 10/11/2020 0312   ALT 17 10/11/2020 0312   ALKPHOS 85 10/11/2020 0312   BILITOT 0.3 10/11/2020 0312   GFRNONAA >60 10/11/2020 0312   GFRAA >60 07/31/2018 1508   Lipase     Component Value Date/Time   LIPASE 29 10/08/2020 0858       Studies/Results: No results found.  Anti-infectives: Anti-infectives (From admission, onward)   Start      Dose/Rate Route Frequency Ordered Stop   10/08/20 2200  piperacillin-tazobactam (ZOSYN) IVPB 3.375 g        3.375 g 12.5 mL/hr over 240 Minutes Intravenous Every 8 hours 10/08/20 1111     10/08/20 1115  piperacillin-tazobactam (ZOSYN) IVPB 3.375 g        3.375 g 100 mL/hr over 30 Minutes Intravenous  Once 10/08/20 1113 10/08/20 1228       Assessment/Plan Acute on chronic hyponatremia  CBP Polymyalgia rheumatica Fibromyalgia HTN GERD  Recurrent posterior hepatic/RTP abscess, unclear etiology S/p IR drain placement 5/16 - The patient has a recurrent abscess similar to 5 years ago. Her appendix is normal on this scan and this is very high and posterior to think this is related to her appendix. No obvious colon masses noted, but would recommend GI evaluation at some point. Patient declines colonoscopy at this time. I think she could go home on oral antibiotics later today. Her husband is coming later this morning and they will need teaching on drain care. Patient can follow up with IR and PCP. She does not need surgical follow up at this time. We will sign off, please call with questions or concerns.   FEN -soft diet VTE -ok for  prophylaxis from our standpoint ID -zosyn 5/16 >>   LOS: 3 days    Shannon Montgomery, [facility] 10/11/2020, 8:26 AM Please see Amion for pager number during day hours 7:00am-4:30pm

## 2020-10-11 NOTE — Consult Note (Signed)
[facility]    Date of Admission:  10/08/2020     Total days of antibiotics 4  Zosyn         Reason for Consult: hepatic/retroperitoneal abscess   Referring Provider: Sharon Seller Primary Care Provider: Pearson Grippe, MD    Assessment: Shannon Montgomery is a 86 y.o. 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 Growing klebsiella pneumoniae and likely an anaerobe. She is tolerating zosyn well and clinically improved. She did not present septic. I think she would be OK to go home with oral antibiotics to continue treatment for her infection. Augmentin 875 BID would be recommended. Would discharge her with rx for 30 days and we can follow up in ID clinic for further recommendations on length of therapy pending further imaging.   Not clear to the cause of these recurrences, which we talked about today. Gen surgery team does not feel r/t appendix given it is not contiguous (this was theorized on the last episode).  Would recommend outpatient GI consultation (she is requesting referral for one with hospital discharge if we can help arrange since she is in limbo with PCP at the moment). Possible biliary source (has gall stones, potential that they may be intermittently obstructing?) vs colonic source.    Plan: 1. Will switch to unasyn during hospitalization. Anticipate she can be discharged in 1-2 days.  2. Will plan 30-days to be prepared with [facility] pharmacy for discharge 3. FU with IR/drain clinic 4. Needs GI referral outpatient (not previously established)  5. FU appt made with ID 6/3 9:00 am with me   Will check in with her tomorrow    Principal Problem:   Retroperitoneal abscess [facility]) Active Problems:   Essential hypertension   Hyponatremia   Elevated alkaline phosphatase level   . enoxaparin (LOVENOX) injection  40 mg Subcutaneous Daily  .  fluorometholone  1 drop Both Eyes Daily  . magnesium gluconate  500 mg Oral Daily  . pantoprazole  40 mg Oral Daily  . sodium chloride flush  3 mL Intravenous Q12H  . sodium chloride flush  5 mL Intracatheter Q8H    HPI: Shannon Montgomery is a 86 y.o. female admitted with 2 week history of fatigue and weakness. Found to have recurrent posterior hepatic / retroperitoneal abscess (first episode 5 years prior).  At the time of ER evaluation she also was reporting RUQ abdominal pain for 1 week that was intermittent and described to be sharp (though she denied any abdominal pain to [place] today).  She was not acutely ill appearing on presentation. CT scan revealed recurrent retroperitoneal/posterior liver abscess. IR was consulted at the recommendation of general surgery and a percutaneous drain was placed. Cultures grew out klebsiella pneumoniae and likely an anaerobe yet to be identified.   She is feeling better. No fevers or chills. Eating a little better than prior to admission. Tolerating antibiotics well at this time.   Her and her husband have many questions about prior vaccinations causing these recurrent episodes, or old cataract surgery (16 years prior) and why her potassium levels are very low. She would like any help we can offer to get her stamina and energy back.  She has never had a colonoscopy before.    Review of Systems: Review of Systems  Constitutional: Negative for chills and fever.  HENT: Negative for tinnitus.   Eyes: Negative for  blurred vision and photophobia.  Respiratory: Negative for cough and sputum production.   Cardiovascular: Negative for chest pain.  Gastrointestinal: Positive for abdominal pain. Negative for diarrhea, nausea and vomiting.  Genitourinary: Negative for dysuria.  Skin: Negative for rash.  Neurological: Negative for headaches.     Past Medical History:  Diagnosis Date  . Anxiety   . Arthritis   . Chronic back pain   . Chronic sinusitis   .  Degenerative joint disease of knee, right   . Fibromyalgia   . GERD (gastroesophageal reflux disease)   . Osteoarthritis    "full of it" (02/18/2017)  . PMR (polymyalgia rheumatica) (HCC)   . Wears glasses     Social History   Tobacco Use  . Smoking status: Never Smoker  . Smokeless tobacco: Never Used  Vaping Use  . Vaping Use: Never used  Substance Use Topics  . Alcohol use: No  . Drug use: No    Family History  Problem Relation Age of Onset  . Breast cancer Mother   . Anuerysm Father   . Diabetes Brother   . Brain cancer Brother   . Alcoholism Brother    Allergies  Allergen Reactions  . Other Other (See Comments)    PLEASE GIVE PT NAUSEA MED BEFORE GIVING ANY PAIN MEDICATION - PT CAN BECOME VERY SICK   . Ciprofloxacin Nausea Only  . Doxycycline Nausea Only  . Erythromycin Nausea Only    Other reaction(s): Unknown  . Hepatitis A Vaccine Swelling    Other reaction(s): Unknown  . Tramadol     Stomach Cramps    OBJECTIVE: Blood pressure (!) 178/97, pulse 85, temperature 98.2 F (36.8 C), temperature source Oral, resp. rate 15, height 5\' 1"  (1.549 m), weight 69.9 kg, SpO2 99 %.  Physical Exam Vitals reviewed.  Constitutional:      Appearance: She is well-developed.     Comments: Resting in bed comfortably with her husband present.   HENT:     Mouth/Throat:     Mouth: No oral lesions.     Dentition: Normal dentition. No dental abscesses.     Pharynx: No oropharyngeal exudate.  Cardiovascular:     Rate and Rhythm: Normal rate and regular rhythm.     Heart sounds: Normal heart sounds.  Pulmonary:     Effort: Pulmonary effort is normal.     Breath sounds: Normal breath sounds.  Abdominal:     General: Bowel sounds are normal. There is no distension.     Palpations: Abdomen is soft.     Tenderness: There is no abdominal tenderness.     Comments: Perc drain in place with white/cloudy appearing fluid  Lymphadenopathy:     Cervical: No cervical adenopathy.   Skin:    General: Skin is warm and dry.     Findings: No rash.  Neurological:     Mental Status: She is alert and oriented to person, place, and time.  Psychiatric:        Judgment: Judgment normal.     Comments: In good spirits today and engaged in care discussion     Lab Results Lab Results  Component Value Date   WBC 4.7 10/11/2020   HGB 12.4 10/11/2020   HCT 36.5 10/11/2020   MCV 86.1 10/11/2020   PLT 328 10/11/2020    Lab Results  Component Value Date   CREATININE 0.81 10/11/2020   BUN 8 10/11/2020   NA 128 (L) 10/11/2020   K 3.7 10/11/2020  CL 94 (L) 10/11/2020   CO2 27 10/11/2020    Lab Results  Component Value Date   ALT 17 10/11/2020   AST 19 10/11/2020   GGT 54 (H) 10/08/2020   ALKPHOS 85 10/11/2020   BILITOT 0.3 10/11/2020     Microbiology: Recent Results (from the past 240 hour(s))  Blood culture (routine x 2)     Status: None (Preliminary result)   Collection Time: 10/08/20 11:20 AM   Specimen: BLOOD LEFT WRIST  Result Value Ref Range Status   Specimen Description BLOOD LEFT WRIST  Final   Special Requests   Final    BOTTLES DRAWN AEROBIC AND ANAEROBIC Blood Culture results may not be optimal due to an inadequate volume of blood received in culture bottles   Culture   Final    NO GROWTH 3 DAYS Performed at [facility], [address] [place], [place] [address]    Report Status PENDING  Incomplete  Blood culture (routine x 2)     Status: None (Preliminary result)   Collection Time: 10/08/20 11:25 AM   Specimen: BLOOD  Result Value Ref Range Status   Specimen Description BLOOD RIGHT ANTECUBITAL  Final   Special Requests   Final    BOTTLES DRAWN AEROBIC AND ANAEROBIC Blood Culture results may not be optimal due to an inadequate volume of blood received in culture bottles   Culture   Final    NO GROWTH 3 DAYS Performed at [facility], [address] [place], [place] [address]    Report Status PENDING  Incomplete   Aerobic/Anaerobic Culture (surgical/deep wound)     Status: None (Preliminary result)   Collection Time: 10/08/20  4:04 PM   Specimen: Liver; Abscess  Result Value Ref Range Status   Specimen Description LIVER  Final   Special Requests NONE  Final   Gram Stain   Final    ABUNDANT WBC PRESENT, PREDOMINANTLY PMN MODERATE GRAM POSITIVE COCCI FEW GRAM NEGATIVE RODS    Culture   Final    MODERATE KLEBSIELLA PNEUMONIAE HOLDING FOR POSSIBLE ANAEROBE CULTURE REINCUBATED FOR BETTER GROWTH Performed at [facility], [address], [place], [place] [address]    Report Status PENDING  Incomplete   Organism ID, Bacteria KLEBSIELLA PNEUMONIAE  Final      Susceptibility   Klebsiella pneumoniae - MIC*    AMPICILLIN RESISTANT Resistant     CEFAZOLIN <=4 SENSITIVE Sensitive     CEFEPIME <=0.12 SENSITIVE Sensitive     CEFTAZIDIME <=1 SENSITIVE Sensitive     CEFTRIAXONE <=0.25 SENSITIVE Sensitive     CIPROFLOXACIN <=0.25 SENSITIVE Sensitive     GENTAMICIN <=1 SENSITIVE Sensitive     IMIPENEM <=0.25 SENSITIVE Sensitive     TRIMETH/SULFA <=20 SENSITIVE Sensitive     AMPICILLIN/SULBACTAM <=2 SENSITIVE Sensitive     PIP/TAZO <=4 SENSITIVE Sensitive     * MODERATE KLEBSIELLA PNEUMONIAE     [name], MSN, NP-C [facility]  [email or web address] Pager: [phone] Office: [phone] [facility] Main Line: [phone]

## 2020-10-11 NOTE — Progress Notes (Signed)
Shannon Montgomery  EGB:[id number] DOB: 26-Dec-1934 DOA: 10/08/2020 PCP: Pearson Grippe, MD    Brief Narrative:  86 year old with a history of HTN and perihepatic abscess drained 5 years ago who presented to the hospital with 2 weeks of severe right flank pain.  Work-up in the ER revealed a retrohepatic abscess.  She was admitted with acute units in IR performed drainage of this abscess.  Consultants:  IR General surgery ID  Code Status: FULL CODE  Antimicrobials:  Zosyn 5/16 >  DVT prophylaxis: Lovenox  Subjective: Afebrile.  Blood pressure trending upward.  Saturations stable.  Sodium slowly improving.  Magnesium improved.  WBC normal.  No new complaints today.  Is anxious to go home.  Assessment & Plan:  Recurrent posterior hepatic retroperitoneal abscess Etiology unclear -asymptomatic for last 5 years -status post percutaneous drain placement by IR - previous abscess 2017 positive for Streptococcus -blood cultures currently negative -patient refuses colonoscopy -anticipate discharge home with oral antibiotics and drain care -abscess culture now revealing Klebsiella pneumoniae which is ampicillin resistant but otherwise broadly sensitive - ID consulted to assist w/ abx selection and outpt f/u   Hyponatremia Suspect due to simple hypovolemia -improved with hydration  Hypokalemia Corrected with supplementation  Hypomagnesemia Improved with supplementation  HTN Blood pressure trending upward -monitor without change for now   Family Communication:  Status is: Inpatient  Remains inpatient appropriate because:Inpatient level of care appropriate due to severity of illness   Dispo: The patient is from: Home              Anticipated d/c is to: Home              Patient currently is not medically stable to d/c.   Difficult to place patient No   Objective: Blood pressure (!) 178/97, pulse 85, temperature 98.2 F (36.8 C), temperature source Oral, resp. rate 15, height 5\' 1"   (1.549 m), weight 69.9 kg, SpO2 99 %.  Intake/Output Summary (Last 24 hours) at 10/11/2020 1017 Last data filed at 10/11/2020 0634 Gross per 24 hour  Intake 235 ml  Output 3175 ml  Net -2940 ml   Filed Weights   10/08/20 0850 10/09/20 0500  Weight: 68 kg 69.9 kg    Examination: General: No acute respiratory distress Lungs: Clear to auscultation bilaterally without wheeze Cardiovascular: Regular rate and rhythm without murmur  Abdomen: Nontender, nondistended, soft, bowel sounds positive, no rebound, no ascites, no appreciable mass Extremities: No significant edema bilateral lower extremities  CBC: Recent Labs  Lab 10/09/20 0819 10/10/20 0313 10/11/20 0312  WBC 8.0 5.5 4.7  NEUTROABS 6.8 3.8 3.4  HGB 13.3 11.1* 12.4  HCT 39.1 33.7* 36.5  MCV 86.1 88.5 86.1  PLT 322 282 328   Basic Metabolic Panel: Recent Labs  Lab 10/09/20 0819 10/10/20 0313 10/11/20 0312  NA 126* 125* 128*  K 3.3* 4.0 3.7  CL 90* 94* 94*  CO2 28 25 27   GLUCOSE 132* 96 144*  BUN 6* 9 8  CREATININE 0.84 0.81 0.81  CALCIUM 8.4* 8.3* 8.4*  MG  --  1.4* 1.7  PHOS  --  3.2  --    GFR: Estimated Creatinine Clearance: 46.2 mL/min (by C-G formula based on SCr of 0.81 mg/dL).  Liver Function Tests: Recent Labs  Lab 10/08/20 0858 10/09/20 0819 10/10/20 0313 10/11/20 0312  AST 22 19 18 19   ALT 16 17 15 17   ALKPHOS 127* 109 90 85  BILITOT 0.9 0.8 0.2* 0.3  PROT 6.2*  5.7* 5.1* 5.6*  ALBUMIN 3.0* 2.5* 2.3* 2.5*   Recent Labs  Lab 10/08/20 0858  LIPASE 29    Coagulation Profile: Recent Labs  Lab 10/08/20 0858  INR 1.1    Recent Results (from the past 240 hour(s))  Blood culture (routine x 2)     Status: None (Preliminary result)   Collection Time: 10/08/20 11:20 AM   Specimen: BLOOD LEFT WRIST  Result Value Ref Range Status   Specimen Description BLOOD LEFT WRIST  Final   Special Requests   Final    BOTTLES DRAWN AEROBIC AND ANAEROBIC Blood Culture results may not be optimal due  to an inadequate volume of blood received in culture bottles   Culture   Final    NO GROWTH 3 DAYS Performed at [facility], [address] [place], [place] [address]    Report Status PENDING  Incomplete  Blood culture (routine x 2)     Status: None (Preliminary result)   Collection Time: 10/08/20 11:25 AM   Specimen: BLOOD  Result Value Ref Range Status   Specimen Description BLOOD RIGHT ANTECUBITAL  Final   Special Requests   Final    BOTTLES DRAWN AEROBIC AND ANAEROBIC Blood Culture results may not be optimal due to an inadequate volume of blood received in culture bottles   Culture   Final    NO GROWTH 3 DAYS Performed at [facility], [address] [place], [place] [address]    Report Status PENDING  Incomplete  Aerobic/Anaerobic Culture (surgical/deep wound)     Status: None (Preliminary result)   Collection Time: 10/08/20  4:04 PM   Specimen: Liver; Abscess  Result Value Ref Range Status   Specimen Description LIVER  Final   Special Requests NONE  Final   Gram Stain   Final    ABUNDANT WBC PRESENT, PREDOMINANTLY PMN MODERATE GRAM POSITIVE COCCI FEW GRAM NEGATIVE RODS    Culture   Final    MODERATE KLEBSIELLA PNEUMONIAE HOLDING FOR POSSIBLE ANAEROBE Performed at [facility], [address] [place], [place] [address]    Report Status PENDING  Incomplete   Organism ID, Bacteria KLEBSIELLA PNEUMONIAE  Final      Susceptibility   Klebsiella pneumoniae - MIC*    AMPICILLIN RESISTANT Resistant     CEFAZOLIN <=4 SENSITIVE Sensitive     CEFEPIME <=0.12 SENSITIVE Sensitive     CEFTAZIDIME <=1 SENSITIVE Sensitive     CEFTRIAXONE <=0.25 SENSITIVE Sensitive     CIPROFLOXACIN <=0.25 SENSITIVE Sensitive     GENTAMICIN <=1 SENSITIVE Sensitive     IMIPENEM <=0.25 SENSITIVE Sensitive     TRIMETH/SULFA <=20 SENSITIVE Sensitive     AMPICILLIN/SULBACTAM <=2 SENSITIVE Sensitive     PIP/TAZO <=4 SENSITIVE Sensitive     * MODERATE KLEBSIELLA PNEUMONIAE      Scheduled Meds: . enoxaparin (LOVENOX) injection  40 mg Subcutaneous Daily  . fluorometholone  1 drop Both Eyes Daily  . magnesium gluconate  500 mg Oral Daily  . pantoprazole  40 mg Oral Daily  . sodium chloride flush  3 mL Intravenous Q12H  . sodium chloride flush  5 mL Intracatheter Q8H   Continuous Infusions: . sodium chloride    . piperacillin-tazobactam 3.375 g (10/11/20 8341)     LOS: 3 days   Lonia Blood, MD [facility] Office  [phone] Pager - Text Page per Amion  If 7PM-7AM, please contact night-coverage per Amion 10/11/2020, 10:17 AM

## 2020-10-11 NOTE — Plan of Care (Signed)

## 2020-10-11 NOTE — Care Management Important Message (Signed)
Important Message  Patient Details  Name: Shannon Montgomery MRN: [id number] Date of Birth: [birth year]   Medicare Important Message Given:  Yes     [name] Stefan Church 10/11/2020, 2:37 PM

## 2020-10-12 DIAGNOSIS — K6819 Other retroperitoneal abscess: Secondary | ICD-10-CM | POA: Diagnosis not present

## 2020-10-12 LAB — AEROBIC/ANAEROBIC CULTURE W GRAM STAIN (SURGICAL/DEEP WOUND)

## 2020-10-12 LAB — COMPREHENSIVE METABOLIC PANEL
ALT: 15 U/L (ref 0–44)
AST: 16 U/L (ref 15–41)
Albumin: 2.3 g/dL — ABNORMAL LOW (ref 3.5–5.0)
Alkaline Phosphatase: 74 U/L (ref 38–126)
Anion gap: 7 (ref 5–15)
BUN: 6 mg/dL — ABNORMAL LOW (ref 8–23)
CO2: 27 mmol/L (ref 22–32)
Calcium: 8.3 mg/dL — ABNORMAL LOW (ref 8.9–10.3)
Chloride: 92 mmol/L — ABNORMAL LOW (ref 98–111)
Creatinine, Ser: 0.73 mg/dL (ref 0.44–1.00)
GFR, Estimated: >60 mL/min (ref >60)
Glucose, Bld: 94 mg/dL (ref 70–99)
Potassium: 3.7 mmol/L (ref 3.5–5.1)
Sodium: 126 mmol/L — ABNORMAL LOW (ref 135–145)
Total Bilirubin: 0.3 mg/dL (ref 0.3–1.2)
Total Protein: 5.3 g/dL — ABNORMAL LOW (ref 6.5–8.1)

## 2020-10-12 LAB — CBC
HCT: 35.5 % — ABNORMAL LOW (ref 36.0–46.0)
Hemoglobin: 12 g/dL (ref 12.0–15.0)
MCH: 28.7 pg (ref 26.0–34.0)
MCHC: 33.8 g/dL (ref 30.0–36.0)
MCV: 84.9 fL (ref 80.0–100.0)
Platelets: 348 10*3/uL (ref 150–400)
RBC: 4.18 MIL/uL (ref 3.87–5.11)
RDW: 12.2 % (ref 11.5–15.5)
WBC: 4.6 10*3/uL (ref 4.0–10.5)
nRBC: 0 % (ref 0.0–0.2)

## 2020-10-12 MED ORDER — AMOXICILLIN-POT CLAVULANATE 875-125 MG PO TABS: 1.0000 | ORAL_TABLET | Freq: Two times a day (BID) | ORAL | 0 refills | Status: DC

## 2020-10-12 MED ORDER — ACETAMINOPHEN 500 MG PO TABS: 500.0000 mg | ORAL_TABLET | Freq: Three times a day (TID) | ORAL | 0 refills | Status: DC | PRN

## 2020-10-12 MED ORDER — AMOXICILLIN-POT CLAVULANATE 875-125 MG PO TABS
1.0000 | ORAL_TABLET | Freq: Two times a day (BID) | ORAL | Status: DC
  Administered 2020-10-12: 1 via ORAL
  Filled 2020-10-12: qty 1

## 2020-10-12 MED ORDER — AMOXICILLIN-POT CLAVULANATE 875-125 MG PO TABS: 1.0000 | ORAL_TABLET | Freq: Two times a day (BID) | ORAL | 0 refills | Status: AC

## 2020-10-12 NOTE — Discharge Summary (Signed)
DISCHARGE SUMMARY  Shannon Montgomery  MR#: [id number]  DOB:[birth year]  Date of Admission: 10/08/2020 Date of Discharge: 10/12/2020  Attending Physician:Domenique Southers Silvestre Gunner, MD  Patient's PCP:Kim, Fayrene Fearing, MD  Consults: ID IR  Gen Surgery   Disposition: D/C home   Follow-up Appts:  Follow-up Information    Suttle, Thressa Sheller, MD Follow up.   Specialties: Interventional Radiology, Diagnostic Radiology, Radiology Why: Follow up visit with CT scan and possible drain injection 10-14 days after diachage. Our office will call you to set up the appointment.  Contact information: [address] [place] [address] [phone]        [facility] Follow up on 10/26/2020.   Specialty: Infectious Diseases Why: 9:00 am appointment with [name], NP  Contact information: [address] [place], [place] [address] [id number] mc [place] [address] [phone]              Discharge Diagnoses: Recurrent posterior hepatic retroperitoneal abscess Hyponatremia Hypokalemia Hypomagnesemia HTN  Initial presentation: 86yo with a history of anxiety and a perihepatic abscess drained 5 years ago who presented to the hospital with 2 weeks of severe right flank pain.  Work-up in the ER revealed a retrohepatic abscess.  She was admitted to the acute unit and IR performed drainage of the abscess percutaneously.  Hospital Course:  Recurrent posterior hepatic retroperitoneal abscess Etiology unclear - asymptomatic for last 5 years - status post percutaneous drain placement by IR 5/16 - previous abscess 2017 positive for Streptococcus - blood cultures negative this admit  -patient refuses colonoscopy - discharge home with oral antibiotics as per ID recs, and drain care - abscess culture revealed Klebsiella pneumoniae which is ampicillin resistant but otherwise broadly sensitive, as well as Strep intermedius, and anaerobe - ID assisted  greatly w/ abx selection and outpt f/u   Hyponatremia Suspect due to simple hypovolemia -improved with hydration  Hypokalemia corrected with supplementation  Hypomagnesemia corrected with supplementation  Elevated BP Blood pressure trending upward - may simply be related to anxiety of hospitalization - unclear if pt carries formal diagnosis of HTN - is not on BP meds as outpt - suggest f/u of BP by PCP in outpt setting and if elevated BP persists consider initiating medical tx     Allergies as of 10/12/2020      Reactions   Other Other (See Comments)   PLEASE GIVE PT NAUSEA MED BEFORE GIVING ANY PAIN MEDICATION - PT CAN BECOME VERY SICK    Ciprofloxacin Nausea Only   Doxycycline Nausea Only   Erythromycin Nausea Only   Other reaction(s): Unknown   Hepatitis A Vaccine Swelling   Other reaction(s): Unknown   Tramadol    Stomach Cramps      Medication List    TAKE these medications   acetaminophen 500 MG tablet Commonly known as: TYLENOL Take 1 tablet (500 mg total) by mouth every 8 (eight) hours as needed for mild pain or moderate pain.   amoxicillin-clavulanate 875-125 MG tablet Commonly known as: Augmentin Take 1 tablet by mouth 2 (two) times daily.   Calcium-Magnesium-Zinc 333-133-5 MG Tabs Take 1 tablet by mouth daily.   celecoxib 200 MG capsule Commonly known as: CELEBREX Take 200 mg by mouth as directed. Take 2 capsules (400 mg) at breakfast and Take 1 capsule (200 mg) daily PRN for PAIN   Cholecalciferol 50 MCG (2000 UT) Caps Take 1 capsule by mouth daily.   cyanocobalamin 1000 MCG tablet Take 1 tablet  by mouth daily.   fluorometholone 0.1 % ophthalmic suspension Commonly known as: FML Place 1 drop into both eyes daily.   lactose free nutrition Liqd Take 237 mLs by mouth 2 (two) times daily between meals.   LORazepam 0.5 MG tablet Commonly known as: ATIVAN Take 0.25 mg by mouth daily as needed for anxiety or sleep.   sodium chloride 0.65 %  Soln nasal spray Commonly known as: OCEAN Place 1 spray into both nostrils 2 (two) times daily as needed for congestion.   VITAMIN C PO Take 2,000 Units by mouth daily.       Day of Discharge BP (!) 174/94 (BP Location: Right Arm)   Pulse 85   Temp 98.2 F (36.8 C) (Oral)   Resp 17   Ht 5\' 1"  (1.549 m)   Wt 69.9 kg   SpO2 99%   BMI 29.10 kg/m   Physical Exam: General: No acute respiratory distress Lungs: Clear to auscultation bilaterally without wheezes or crackles Cardiovascular: Regular rate and rhythm without murmur gallop or rub normal S1 and S2 Abdomen: Nontender, nondistended, soft, bowel sounds positive, no rebound, no ascites, no appreciable mass Extremities: No significant cyanosis, clubbing, or edema bilateral lower extremities  Basic Metabolic Panel: Recent Labs  Lab 10/08/20 0858 10/09/20 0819 10/10/20 0313 10/11/20 0312 10/12/20 0042  NA 123* 126* 125* 128* 126*  K 4.3 3.3* 4.0 3.7 3.7  CL 87* 90* 94* 94* 92*  CO2 27 28 25 27 27   GLUCOSE 126* 132* 96 144* 94  BUN 7* 6* 9 8 6*  CREATININE 0.85 0.84 0.81 0.81 0.73  CALCIUM 8.9 8.4* 8.3* 8.4* 8.3*  MG  --   --  1.4* 1.7  --   PHOS  --   --  3.2  --   --     Liver Function Tests: Recent Labs  Lab 10/08/20 0858 10/09/20 0819 10/10/20 0313 10/11/20 0312 10/12/20 0042  AST 22 19 18 19 16   ALT 16 17 15 17 15   ALKPHOS 127* 109 90 85 74  BILITOT 0.9 0.8 0.2* 0.3 0.3  PROT 6.2* 5.7* 5.1* 5.6* 5.3*  ALBUMIN 3.0* 2.5* 2.3* 2.5* 2.3*   Recent Labs  Lab 10/08/20 0858  LIPASE 29   CBC: Recent Labs  Lab 10/08/20 0858 10/09/20 0819 10/10/20 0313 10/11/20 0312 10/12/20 0042  WBC 9.9 8.0 5.5 4.7 4.6  NEUTROABS 8.5* 6.8 3.8 3.4  --   HGB 13.0 13.3 11.1* 12.4 12.0  HCT 37.6 39.1 33.7* 36.5 35.5*  MCV 85.5 86.1 88.5 86.1 84.9  PLT 315 322 282 328 348    Recent Results (from the past 240 hour(s))  Blood culture (routine x 2)     Status: None (Preliminary result)   Collection Time: 10/08/20  11:20 AM   Specimen: BLOOD LEFT WRIST  Result Value Ref Range Status   Specimen Description BLOOD LEFT WRIST  Final   Special Requests   Final    BOTTLES DRAWN AEROBIC AND ANAEROBIC Blood Culture results may not be optimal due to an inadequate volume of blood received in culture bottles   Culture   Final    NO GROWTH 4 DAYS Performed at [facility], [address], [place], 10/10/20 [facility]    Report Status PENDING  Incomplete  Blood culture (routine x 2)     Status: None (Preliminary result)   Collection Time: 10/08/20 11:25 AM   Specimen: BLOOD  Result Value Ref Range Status   Specimen Description BLOOD  RIGHT ANTECUBITAL  Final   Special Requests   Final    BOTTLES DRAWN AEROBIC AND ANAEROBIC Blood Culture results may not be optimal due to an inadequate volume of blood received in culture bottles   Culture   Final    NO GROWTH 4 DAYS Performed at [facility], [address], [place], [place] [address]    Report Status PENDING  Incomplete  Aerobic/Anaerobic Culture (surgical/deep wound)     Status: None   Collection Time: 10/08/20  4:04 PM   Specimen: Liver; Abscess  Result Value Ref Range Status   Specimen Description LIVER  Final   Special Requests NONE  Final   Gram Stain   Final    ABUNDANT WBC PRESENT, PREDOMINANTLY PMN MODERATE GRAM POSITIVE COCCI FEW GRAM NEGATIVE RODS    Culture   Final    MODERATE KLEBSIELLA PNEUMONIAE ABUNDANT STREPTOCOCCUS INTERMEDIUS FEW BACTEROIDES THETAIOTAOMICRON FEW BACTEROIDES FRAGILIS BETA LACTAMASE POSITIVE Performed at [facility], [address] [place], [place] [address]    Report Status 10/12/2020 FINAL  Final   Organism ID, Bacteria KLEBSIELLA PNEUMONIAE  Final   Organism ID, Bacteria STREPTOCOCCUS INTERMEDIUS  Final      Susceptibility   Klebsiella pneumoniae - MIC*    AMPICILLIN RESISTANT Resistant     CEFAZOLIN <=4 SENSITIVE Sensitive     CEFEPIME <=0.12 SENSITIVE Sensitive     CEFTAZIDIME <=1  SENSITIVE Sensitive     CEFTRIAXONE <=0.25 SENSITIVE Sensitive     CIPROFLOXACIN <=0.25 SENSITIVE Sensitive     GENTAMICIN <=1 SENSITIVE Sensitive     IMIPENEM <=0.25 SENSITIVE Sensitive     TRIMETH/SULFA <=20 SENSITIVE Sensitive     AMPICILLIN/SULBACTAM <=2 SENSITIVE Sensitive     PIP/TAZO <=4 SENSITIVE Sensitive     * MODERATE KLEBSIELLA PNEUMONIAE   Streptococcus intermedius - MIC*    PENICILLIN <=0.06 SENSITIVE Sensitive     CEFTRIAXONE 0.25 SENSITIVE Sensitive     ERYTHROMYCIN 4 RESISTANT Resistant     LEVOFLOXACIN 0.5 SENSITIVE Sensitive     VANCOMYCIN 0.5 SENSITIVE Sensitive     * ABUNDANT STREPTOCOCCUS INTERMEDIUS     Time spent in discharge (includes decision making & examination of pt): 35 minutes  10/12/2020, 11:10 AM   Lonia Blood, MD [facility] Office  [phone]

## 2020-10-12 NOTE — Progress Notes (Signed)
[facility]  Date of Admission:  10/08/2020           Reason for visit: Follow up on abscess  Current antibiotics: Unasyn   ASSESSMENT:    1. Retroperitoneal/posterior hepatic abscess: Status post percutaneous drain placement with cultures growing Klebsiella pneumonia, strep intermedius, and anaerobes.  She has responded well to drainage and antibiotics.  PLAN:    . Transition to Augmentin at discharge . Will need IR drain clinic follow-up outpatient  . Final duration of therapy will based upon drain output and imaging follow-up . Appointment at [facility] with [name] on June 3 at 9 AM . Will sign off for now please call as needed . Likely would benefit from GI evaluation as an outpatient   Principal Problem:   Retroperitoneal abscess (HCC) Active Problems:   Hyponatremia    MEDICATIONS:    Scheduled Meds: . amoxicillin-clavulanate  1 tablet Oral Q12H  . enoxaparin (LOVENOX) injection  40 mg Subcutaneous Daily  . fluorometholone  1 drop Both Eyes Daily  . magnesium gluconate  500 mg Oral Daily  . pantoprazole  40 mg Oral Daily  . sodium chloride flush  3 mL Intravenous Q12H  . sodium chloride flush  5 mL Intracatheter Q8H   Continuous Infusions: . sodium chloride     PRN Meds:.sodium chloride, acetaminophen, fluticasone, guaiFENesin-dextromethorphan, LORazepam, metoCLOPramide, ondansetron **OR** ondansetron (ZOFRAN) IV, sodium chloride flush  SUBJECTIVE:   24 hour events:  No acute events  No fevers or chills.  Many questions about antibiotics.  Otherwise no new complaints.  Looking forward to going home.  Review of Systems  All other systems reviewed and are negative.     OBJECTIVE:   Blood pressure (!) 174/94, pulse 85, temperature 98.2 F (36.8 C), temperature source Oral, resp. rate 17, height 5\' 1"  (1.549 m), weight 69.9 kg, SpO2 99 %. Body mass index is 29.1 kg/m.  Physical Exam Constitutional:       General: She is not in acute distress.    Appearance: She is well-developed.  HENT:     Head: Normocephalic and atraumatic.  Eyes:     Comments: Wearing sunglasses  Pulmonary:     Effort: Pulmonary effort is normal. No respiratory distress.  Abdominal:     Comments: IR drain in place  Neurological:     General: No focal deficit present.     Mental Status: She is alert and oriented to person, place, and time.  Psychiatric:        Mood and Affect: Mood normal.        Behavior: Behavior normal.      Lab Results: Lab Results  Component Value Date   WBC 4.6 10/12/2020   HGB 12.0 10/12/2020   HCT 35.5 (L) 10/12/2020   MCV 84.9 10/12/2020   PLT 348 10/12/2020    Lab Results  Component Value Date   NA 126 (L) 10/12/2020   K 3.7 10/12/2020   CO2 27 10/12/2020   GLUCOSE 94 10/12/2020   BUN 6 (L) 10/12/2020   CREATININE 0.73 10/12/2020   CALCIUM 8.3 (L) 10/12/2020   GFRNONAA >60 10/12/2020   GFRAA >60 07/31/2018    Lab Results  Component Value Date   ALT 15 10/12/2020   AST 16 10/12/2020   GGT 54 (H) 10/08/2020   ALKPHOS 74 10/12/2020   BILITOT 0.3 10/12/2020    No results found for: CRP  No results found for: ESRSEDRATE  I have reviewed the micro and lab results in Epic.  Imaging: No results found.   Imaging independently reviewed in Epic.    Shannon Montgomery for Infectious Disease [facility] [phone] pager 10/12/2020, 12:42 PM

## 2020-10-12 NOTE — Progress Notes (Signed)
Pt discharged home in stable condition 

## 2020-10-12 NOTE — Progress Notes (Signed)
Patient's bp trending at 180s/90s, no PRN meds available for patient's BP. Patient currently resting in bed with eyes closed. Attempted to contact provider, no response as of yet.   10/12/20 0059  Provider Notification  Provider Name/Title Linton Flemings, APP  Date Provider Notified 10/12/20  Time Provider Notified [phone]  Notification Type Page  Notification Reason Change in status (Patient BP trending at 185/98, HR 96, no PRN meds available for patient's BP.)  Provider response Other (Comment) (Waiting for response.)

## 2020-10-12 NOTE — Discharge Instructions (Signed)
Out patient discharge instruction for the drain and dressing:   ? Patient to flush the drain with 5 cc normal saline daily ? Record output daily (subtract 5 cc that was used to flush the drain from the total daily output)  ? Dressing change as needed and at least once a week.    AUGMENTIN (Amoxicillin/Clavulanic Acid) Tablets What is this medicine? AMOXICILLIN; CLAVULANIC ACID (a mox i SIL in; KLAV yoo lan ic AS id) is a penicillin antibiotic. It treats some infections caused by bacteria. It will not work for colds, the flu, or other viruses. This medicine may be used for other purposes; ask your health care provider or pharmacist if you have questions. COMMON BRAND NAME(S): Augmentin What should I tell my health care provider before I take this medicine? They need to know if you have any of these conditions:  kidney disease  liver disease  mononucleosis  stomach or intestine problems such as colitis  an unusual or allergic reaction to amoxicillin, other penicillin or cephalosporin antibiotics, clavulanic acid, other medicines, foods, dyes, or preservatives  pregnant or trying to get pregnant  breast-feeding How should I use this medicine? Take this drug by mouth. Take it as directed on the prescription label at the same time every day. Take it with food at the start of a meal or snack. Take all of this drug unless your health care provider tells you to stop it early. Keep taking it even if you think you are better. Talk to your health care provider about the use of this drug in children. While it may be prescribed for selected conditions, precautions do apply. Overdosage: If you think you have taken too much of this medicine contact a poison control center or emergency room at once. NOTE: This medicine is only for you. Do not share this medicine with others. What if I miss a dose? If you miss a dose, take it as soon as you can. If it is almost time for your next dose, take only  that dose. Do not take double or extra doses. What may interact with this medicine?  allopurinol  anticoagulants  birth control pills  methotrexate  probenecid This list may not describe all possible interactions. Give your health care provider a list of all the medicines, herbs, non-prescription drugs, or dietary supplements you use. Also tell them if you smoke, drink alcohol, or use illegal drugs. Some items may interact with your medicine. What should I watch for while using this medicine? Tell your health care provider if your symptoms do not start to get better or if they get worse. This medicine may cause serious skin reactions. They can happen weeks to months after starting the medicine. Contact your health care provider right away if you notice fevers or flu-like symptoms with a rash. The rash may be red or purple and then turn into blisters or peeling of the skin. Or, you might notice a red rash with swelling of the face, lips or lymph nodes in your neck or under your arms. Do not treat diarrhea with over the counter products. Contact your health care provider if you have diarrhea that lasts more than 2 days or if it is severe and watery. If you have diabetes, you may get a false-positive result for sugar in your urine. Check with your health care provider. Birth control may not work properly while you are taking this medicine. Talk to your health care provider about using an extra method of birth  control. What side effects may I notice from receiving this medicine? Side effects that you should report to your doctor or health care provider as soon as possible:  allergic reactions (skin rash, itching or hives; swelling of the face, lips, or tongue)  bloody or watery diarrhea  dark urine  infection (fever, chills, cough, sore throat, or pain)  kidney injury (trouble passing urine or change in the amount of urine)  redness, blistering, peeling, or loosening of the skin,  including inside the mouth  seizures  thrush (white patches in the mouth or mouth sores)  trouble breathing  unusual bruising or bleeding  unusually weak or tired Side effects that usually do not require medical attention (report to your doctor or health care provider if they continue or are bothersome):  diarrhea  dizziness  headache  nausea, vomiting  unusual vaginal discharge, itching, or odor  upset stomach This list may not describe all possible side effects. Call your doctor for medical advice about side effects. You may report side effects to FDA at 1-800-FDA-1088. Where should I keep my medicine? Keep out of the reach of children and pets. Store at room temperature between 20 and 25 degrees C (68 and 77 degrees F). Throw away any unused drug after the expiration date. NOTE: This sheet is a summary. It may not cover all possible information. If you have questions about this medicine, talk to your doctor, pharmacist, or health care provider.  2021 Elsevier/Gold Standard (2020-04-04 09:45:03)

## 2020-10-14 LAB — CULTURE, BLOOD (ROUTINE X 2)
Culture: NO GROWTH
Culture: NO GROWTH

## 2020-10-16 ENCOUNTER — Other Ambulatory Visit: Payer: Self-pay

## 2020-10-16 ENCOUNTER — Telehealth: Payer: Self-pay

## 2020-10-16 MED ORDER — ONDANSETRON HCL 4 MG PO TABS: 4.0000 mg | ORAL_TABLET | Freq: Three times a day (TID) | ORAL | 0 refills | Status: DC | PRN

## 2020-10-16 NOTE — Telephone Encounter (Signed)
I spoke to the patient and she would like to stop the amoxicillin. Patient states she does feel right about the treatment plan and does wish to follow up with our office. Patient stated she would like to follow up with her pcp.  Shannon Montgomery T [name]

## 2020-10-16 NOTE — Telephone Encounter (Signed)
Patient husband is calling stating patient experencing a lot of nausea with the amoxicillin. He is asking if something can be sent to the pharmacy for the patient. I ask if patient was taking the medication with food and the husband stated patient is taking it with food.  Please advise.

## 2020-10-16 NOTE — Telephone Encounter (Signed)
Definitely take the antibiotic after a meal to help with the nausea. I will also send in a prescription for zofran for her to take 30 min before each dose to see if that helps.

## 2020-10-16 NOTE — Addendum Note (Signed)
Addended by: Blanchard Kelch on: 10/16/2020 03:55 PM   Modules accepted: Orders

## 2020-10-17 NOTE — Telephone Encounter (Signed)
Thank you :)

## 2020-10-17 NOTE — Telephone Encounter (Signed)
For now keep her appointment with me on the 3rd please :)

## 2020-10-17 NOTE — Telephone Encounter (Signed)
I think that is a bad idea. I have reached out to her PCP to see if they can help re-affirm the plan for [facility]. Maybe if it comes from them it will go over better.

## 2020-10-18 ENCOUNTER — Other Ambulatory Visit: Payer: Self-pay | Admitting: Infectious Diseases

## 2020-10-18 DIAGNOSIS — K6819 Other retroperitoneal abscess: Secondary | ICD-10-CM

## 2020-10-24 ENCOUNTER — Encounter: Payer: Self-pay | Admitting: Radiology

## 2020-10-24 ENCOUNTER — Other Ambulatory Visit: Payer: Self-pay | Admitting: Infectious Diseases

## 2020-10-24 ENCOUNTER — Ambulatory Visit
Admission: RE | Admit: 2020-10-24 | Discharge: 2020-10-24 | Disposition: A | Discharge status: Home / Self Care | Payer: Medicare Other | Source: Ambulatory Visit | Attending: Infectious Diseases | Admitting: Infectious Diseases

## 2020-10-24 ENCOUNTER — Other Ambulatory Visit: Payer: Self-pay

## 2020-10-24 ENCOUNTER — Ambulatory Visit
Admission: RE | Admit: 2020-10-24 | Discharge: 2020-10-24 | Disposition: A | Discharge status: Home / Self Care | Payer: Medicare Other | Source: Ambulatory Visit | Attending: Student | Admitting: Student

## 2020-10-24 DIAGNOSIS — K6819 Other retroperitoneal abscess: Secondary | ICD-10-CM

## 2020-10-24 HISTORY — PX: IR RADIOLOGIST EVAL & MGMT: IMG5224

## 2020-10-24 NOTE — Progress Notes (Signed)
Referring Physician(s): Han,Aimee H  Chief Complaint: The patient is seen in follow up today s/p perihepatic abscess  History of present illness: Shannon Montgomery is an 86 year old female with past medical history of anxiety, fibromyalgia, GERD, PMR, and recurrent hepatic abscesses who presented to [facility] ED 10/08/20 with right flank pain.  She underwent CT Abdomen Pelvis which showed a fluid collection in the right posterior abdomen posterior to the right hepatic lobe and lateral to the right kidney most compatiblewith abscess.  IR was consulted for drain placement and she underwent successful drainage 10/08/20 by Dr. Elby Showers. [name] was subsequently discharged with the drain in place.  She returned to IR drain clinic today for evaluation and management of her drain.   [name] presents today in improved condition since discharge.  She has been managing her drain at home with assistance from her husband.  She reports difficulty tolerating antibiotics and did not take her PM dose yesterday due to significant nausea.  She reports she has called by the ID office as well as her PCP and they are working on getting her an abx that she can tolerate.  Her drain was otherwise remained in place without issue.  Output has slowed to 5 mL per day.  She is hopeful for removal today.   Past Medical History:  Diagnosis Date  . Anxiety   . Arthritis   . Chronic back pain   . Chronic sinusitis   . Degenerative joint disease of knee, right   . Fibromyalgia   . GERD (gastroesophageal reflux disease)   . Osteoarthritis    "full of it" (02/18/2017)  . PMR (polymyalgia rheumatica) (HCC)   . Wears glasses     Past Surgical History:  Procedure Laterality Date  . ABSCESS DRAINAGE Right 12/2015 - 01/2016 X 2   "inside abdomen"  . ANKLE HARDWARE REMOVAL Right 04/2014   "partial hardware removed"  . CATARACT EXTRACTION W/ INTRAOCULAR LENS  IMPLANT, BILATERAL Bilateral   . CORNEAL TRANSPLANT Bilateral  2005-2007   "twice on left side"  . EYE SURGERY    . FRACTURE SURGERY    . I & D EXTREMITY Right 06/08/2013   Procedure: IRRIGATION AND DEBRIDEMENT EXTREMITY/RIGHT ANKLE;  Surgeon: Mable Paris, MD;  Location: [facility] OR;  Service: Orthopedics;  Laterality: Right;  . IR GENERIC HISTORICAL  02/07/2016   IR RADIOLOGIST EVAL & MGMT 02/07/2016 Darrell K Allred, PA-C [facility] INTERV RAD  . IR GENERIC HISTORICAL  02/21/2016   IR RADIOLOGIST EVAL & MGMT 02/21/2016 Barnetta Chapel, PA-C [facility] INTERV RAD  . IR GUIDED DRAIN W CATHETER PLACEMENT  10/08/2020  . JOINT REPLACEMENT    . ORIF ANKLE FRACTURE Right 06/08/2013   Procedure: OPEN REDUCTION INTERNAL FIXATION (ORIF) ANKLE FRACTURE;  Surgeon: Mable Paris, MD;  Location: [facility] OR;  Service: Orthopedics;  Laterality: Right;  . TOTAL KNEE ARTHROPLASTY Right 02/17/2017  . TOTAL KNEE ARTHROPLASTY Right 02/17/2017   Procedure: TOTAL KNEE ARTHROPLASTY;  Surgeon: Marcene Corning, MD;  Location: [facility] OR;  Service: Orthopedics;  Laterality: Right;    Allergies: Other, Ciprofloxacin, Doxycycline, Erythromycin, Hepatitis a vaccine, and Tramadol  Medications: Prior to Admission medications   Medication Sig Start Date End Date Taking? Authorizing Provider  acetaminophen (TYLENOL) 500 MG tablet Take 1 tablet (500 mg total) by mouth every 8 (eight) hours as needed for mild pain or moderate pain. 10/12/20   Lonia Blood, MD  amoxicillin-clavulanate (AUGMENTIN) 875-125 MG tablet Take 1 tablet by mouth 2 (  two) times daily. 10/12/20 11/11/20  Lonia Blood, MD  Ascorbic Acid (VITAMIN C PO) Take 2,000 Units by mouth daily.    [provider]  Calcium-Magnesium-Zinc [id number] MG TABS Take 1 tablet by mouth daily.    [provider]  celecoxib (CELEBREX) 200 MG capsule Take 200 mg by mouth as directed. Take 2 capsules (400 mg) at breakfast and Take 1 capsule (200 mg) daily PRN for PAIN 07/22/18   [provider]  Cholecalciferol  2000 UNITS CAPS Take 1 capsule by mouth daily.    [provider]  cyanocobalamin 1000 MCG tablet Take 1 tablet by mouth daily.    [provider]  fluorometholone (FML) 0.1 % ophthalmic suspension Place 1 drop into both eyes daily.  11/05/15   [provider]  lactose free nutrition (BOOST) LIQD Take 237 mLs by mouth 2 (two) times daily between meals.    [provider]  LORazepam (ATIVAN) 0.5 MG tablet Take 0.25 mg by mouth daily as needed for anxiety or sleep. 05/15/18   [provider]  ondansetron (ZOFRAN) 4 MG tablet Take 1 tablet (4 mg total) by mouth every 8 (eight) hours as needed for nausea or vomiting. 10/16/20   Blanchard Kelch, NP  sodium chloride (OCEAN) 0.65 % SOLN nasal spray Place 1 spray into both nostrils 2 (two) times daily as needed for congestion.    [provider]     Family History  Problem Relation Age of Onset  . Breast cancer Mother   . Anuerysm Father   . Diabetes Brother   . Brain cancer Brother   . Alcoholism Brother     Social History   Socioeconomic History  . Marital status: Married    Spouse name: Not on file  . Number of children: Not on file  . Years of education: Not on file  . Highest education level: Not on file  Occupational History  . Not on file  Tobacco Use  . Smoking status: Never Smoker  . Smokeless tobacco: Never Used  Vaping Use  . Vaping Use: Never used  Substance and Sexual Activity  . Alcohol use: No  . Drug use: No  . Sexual activity: Never    Birth control/protection: None  Other Topics Concern  . Not on file  Social History Narrative  . Not on file   Social Determinants of Health   Financial Resource Strain: Not on file  Food Insecurity: Not on file  Transportation Needs: Not on file  Physical Activity: Not on file  Stress: Not on file  Social Connections: Not on file     Vital Signs: There were no vitals taken for this visit.  Physical Exam  NAD,  alert Abdomen: soft, RUQ/retroperitoneal drain in place.  Insertion site c/d/i.  Trace amount of cloudy, serous-appearing fluid in the bulb.   Imaging: No results found.  Labs:  CBC: Recent Labs    10/09/20 0819 10/10/20 0313 10/11/20 0312 10/12/20 0042  WBC 8.0 5.5 4.7 4.6  HGB 13.3 11.1* 12.4 12.0  HCT 39.1 33.7* 36.5 35.5*  PLT 322 282 328 348    COAGS: Recent Labs    10/08/20 0858  INR 1.1    BMP: Recent Labs    10/09/20 0819 10/10/20 0313 10/11/20 0312 10/12/20 0042  NA 126* 125* 128* 126*  K 3.3* 4.0 3.7 3.7  CL 90* 94* 94* 92*  CO2 28 25 27 27   GLUCOSE 132* 96 144* 94  BUN 6*  9 8 6*  CALCIUM 8.4* 8.3* 8.4* 8.3*  CREATININE 0.84 0.81 0.81 0.73  GFRNONAA >60 >60 >60 >60    LIVER FUNCTION TESTS: Recent Labs    10/09/20 0819 10/10/20 0313 10/11/20 0312 10/12/20 0042  BILITOT 0.8 0.2* 0.3 0.3  AST 19 18 19 16   ALT 17 15 17 15   ALKPHOS 109 90 85 74  PROT 5.7* 5.1* 5.6* 5.3*  ALBUMIN 2.5* 2.3* 2.5* 2.3*    Assessment: Right perihepatic fluid collection s/p percutaneous drain placement 10/08/20 by Dr. .  Patient presents to IR drain clinic today for evaluation of her hepatic drain.   She has been doing well at home.  Her symptoms have resolved.  She is no longer having flank pain.  Output from her drain was significantly decreased.  She is having trouble tolerating her Augmentin, however is working with care teams to manage this.  CT Abdomen Pelvis obtained and reviewed by Dr. 10/10/20 who notes resolve of her fluid collection.  Drain injection also performed and shows no residual abscess cavity, no fistulous connection.  Ok for drain removal.  PA removed drain in its entirety at bedside without complication.  Dressing placed and care instructions given.  Patient does not need scheduled follow-up with IR at this time.  She is encouraged to keep all of her scheduled appointments with ID and PCP for ongoing treatment of her resolving abscess.    Signed: Elby Showers, PA 10/24/2020, 12:49 PM   Please refer to Dr. Hoyt Koch attestation of this note for management and plan.

## 2020-10-26 ENCOUNTER — Inpatient Hospital Stay: Payer: Medicare Other | Admitting: Infectious Diseases

## 2022-02-10 ENCOUNTER — Telehealth: Payer: Self-pay

## 2022-02-10 NOTE — Patient Outreach (Signed)
  Care Coordination   Initial Visit Note   02/10/2022 Name: Shannon Montgomery MRN: [id number] DOB: 21-Nov-1934  Shannon Montgomery is a 87 y.o. year old female who sees Janie Morning, [place] for primary care. I spoke with  Shannon Montgomery by phone today.  What matters to the patients health and wellness today?  none    Goals Addressed             This Visit's Progress    COMPLETED: Care Coordination Activities-No follow up required       Care Coordination Interventions: Advised patient to schedule annual wellness exam          SDOH assessments and interventions completed:  No     Care Coordination Interventions Activated:  Yes  Care Coordination Interventions:  Yes, provided   Follow up plan: No further intervention required.   Encounter Outcome:  Pt. Visit Completed   Jone Baseman, RN, MSN [place] Management Care Management Coordinator Direct Line [phone]

## 2022-02-10 NOTE — Patient Instructions (Signed)
Visit Information  Thank you for taking time to visit with me today. Please don't hesitate to contact me if I can be of assistance to you.   Following are the goals we discussed today:   Goals Addressed             This Visit's Progress    COMPLETED: Care Coordination Activities-No follow up required       Care Coordination Interventions: Advised patient to schedule annual wellness exam           If you are experiencing a Mental Health or [place] or need someone to talk to, please call the Suicide and Crisis Lifeline: 988   Patient verbalizes understanding of instructions and care plan provided today and agrees to view in [place]. Active MyChart status and patient understanding of how to access instructions and care plan via MyChart confirmed with patient.     No further follow up required: patient declined  Jone Baseman, RN, MSN [place] Management Care Management Coordinator Direct Line [phone]

## 2022-05-10 ENCOUNTER — Encounter (HOSPITAL_COMMUNITY): Payer: Self-pay

## 2022-05-10 ENCOUNTER — Emergency Department (HOSPITAL_COMMUNITY)
Admission: EM | Admit: 2022-05-10 | Discharge: 2022-05-10 | Disposition: A | Discharge status: Home / Self Care | Payer: Medicare Other | Attending: Emergency Medicine | Admitting: Emergency Medicine

## 2022-05-10 ENCOUNTER — Emergency Department (HOSPITAL_COMMUNITY): Payer: Medicare Other

## 2022-05-10 ENCOUNTER — Other Ambulatory Visit: Payer: Self-pay

## 2022-05-10 ENCOUNTER — Emergency Department (HOSPITAL_BASED_OUTPATIENT_CLINIC_OR_DEPARTMENT_OTHER): Payer: Medicare Other

## 2022-05-10 DIAGNOSIS — M25561 Pain in right knee: Secondary | ICD-10-CM | POA: Diagnosis present

## 2022-05-10 DIAGNOSIS — E871 Hypo-osmolality and hyponatremia: Secondary | ICD-10-CM | POA: Diagnosis not present

## 2022-05-10 LAB — CBC
HCT: 36.5 % (ref 36.0–46.0)
Hemoglobin: 12.8 g/dL (ref 12.0–15.0)
MCH: 30 pg (ref 26.0–34.0)
MCHC: 35.1 g/dL (ref 30.0–36.0)
MCV: 85.5 fL (ref 80.0–100.0)
Platelets: 312 10*3/uL (ref 150–400)
RBC: 4.27 MIL/uL (ref 3.87–5.11)
RDW: 12.1 % (ref 11.5–15.5)
WBC: 4.1 10*3/uL (ref 4.0–10.5)
nRBC: 0 % (ref 0.0–0.2)

## 2022-05-10 LAB — BASIC METABOLIC PANEL
Anion gap: 11 (ref 5–15)
BUN: 7 mg/dL — ABNORMAL LOW (ref 8–23)
CO2: 29 mmol/L (ref 22–32)
Calcium: 9 mg/dL (ref 8.9–10.3)
Chloride: 84 mmol/L — ABNORMAL LOW (ref 98–111)
Creatinine, Ser: 0.68 mg/dL (ref 0.44–1.00)
GFR, Estimated: >60 mL/min (ref >60)
Glucose, Bld: 128 mg/dL — ABNORMAL HIGH (ref 70–99)
Potassium: 3.3 mmol/L — ABNORMAL LOW (ref 3.5–5.1)
Sodium: 124 mmol/L — ABNORMAL LOW (ref 135–145)

## 2022-05-10 MED ORDER — IBUPROFEN 400 MG PO TABS
600.0000 mg | ORAL_TABLET | Freq: Once | ORAL | Status: DC
  Filled 2022-05-10: qty 1

## 2022-05-10 MED ORDER — KETOROLAC TROMETHAMINE 30 MG/ML IJ SOLN
30.0000 mg | Freq: Once | INTRAMUSCULAR | Status: AC
  Administered 2022-05-10: 30 mg via INTRAVENOUS
  Filled 2022-05-10: qty 1

## 2022-05-10 MED ORDER — SODIUM CHLORIDE 0.9 % IV BOLUS
1000.0000 mL | Freq: Once | INTRAVENOUS | Status: AC
  Administered 2022-05-10: 1000 mL via INTRAVENOUS

## 2022-05-10 MED ORDER — TRAMADOL HCL 50 MG PO TABS
50.0000 mg | ORAL_TABLET | Freq: Once | ORAL | Status: AC
  Administered 2022-05-10: 50 mg via ORAL
  Filled 2022-05-10: qty 1

## 2022-05-10 NOTE — Progress Notes (Signed)
VASCULAR LAB    Right lower extremity venous duple has been performed.  See CV proc for preliminary results.  Messaged negative results to Dr. Roosvelt Harps, [facility], RVT 05/10/2022, 4:19 PM

## 2022-05-10 NOTE — Discharge Instructions (Addendum)
Please call your orthopedic doctor on Monday to schedule follow-up appointment.  Use a walker at all times at home and assistance in getting up.  Your blood work showed that your sodium level was also low in your blood.  Call your main doctor's office on Monday to schedule follow-up appointment for this issue.  This appears to have been ongoing for several months according to your records.

## 2022-05-10 NOTE — ED Provider Triage Note (Signed)
Emergency Medicine Provider Triage Evaluation Note  Shannon Montgomery , a 87 y.o. female  was evaluated in triage.  Pt complains of right leg pain.  Patient reports waking up in the [place] with cramping type sensation around her right knee.  Notes prior total knee replacement.  Denies any recent falls/traumas.  Has been given a pill to use as well as banana up which did not relieve her symptoms prompting visit to the emergency department.  Upon arrival, patient with significant improvement of symptoms.  Denies vomiting, diarrhea, fever, weakness/sensory deficits in affected leg..  Review of Systems  Positive: See abov Negative:   Physical Exam  SpO2 94%  Gen:   Awake, no distress   Resp:  Normal effort  MSK:   Moves extremities without difficulty  Other:  No obvious swelling noted on right knee.  No lower extremity edema noted.  Pedal pulses symmetric bilaterally.  Medical Decision Making  Medically screening exam initiated at 10:09 AM.  Appropriate orders placed.  Shannon Montgomery was informed that the remainder of the evaluation will be completed by another provider, this initial triage assessment does not replace that evaluation, and the importance of remaining in the ED until their evaluation is complete.     Peter Garter, [place] 05/10/22 1010

## 2022-05-10 NOTE — ED Notes (Signed)
Pt able to bear weight on leg, though she states that it is painful.

## 2022-05-10 NOTE — ED Triage Notes (Signed)
Pt BIB [facility] from home c/o right knee pain and cramping since Wednesday. Pt has increased her fluid intake and food intake with no relief. Pt sates she is unable to put weight on that leg.

## 2022-05-10 NOTE — ED Provider Notes (Signed)
[facility] EMERGENCY DEPARTMENT Provider Note   CSN: [id number] Arrival date & time: 05/10/22  0944     History  Chief Complaint  Patient presents with   Knee Pain    Shannon Montgomery is a 87 y.o. female presented to ED with right knee pain.  She has chronic problems with her knee and uses a walker typically at home.  The pain became a lot worse in the past 2 days.  Focally located near the right anterior lower thigh.  She denies any falls or traumas.  Reports pain was severe last night and woke her up from sleep.  She denies history of blood clots or blood thinner use.  She has not been able to see orthopedic doctor.  She is been taking tramadol for a week and "it does not really help".  HPI     Home Medications Prior to Admission medications   Medication Sig Start Date End Date Taking? Authorizing Provider  acetaminophen (TYLENOL) 500 MG tablet Take 1 tablet (500 mg total) by mouth every 8 (eight) hours as needed for mild pain or moderate pain. 10/12/20   Cherene Altes, MD  Ascorbic Acid (VITAMIN C PO) Take 2,000 Units by mouth daily.    [provider]  Calcium-Magnesium-Zinc [phone] MG TABS Take 1 tablet by mouth daily.    [provider]  celecoxib (CELEBREX) 200 MG capsule Take 200 mg by mouth as directed. Take 2 capsules (400 mg) at breakfast and Take 1 capsule (200 mg) daily PRN for PAIN 07/22/18   [provider]  Cholecalciferol 2000 UNITS CAPS Take 1 capsule by mouth daily.    [provider]  cyanocobalamin 1000 MCG tablet Take 1 tablet by mouth daily.    [provider]  fluorometholone (FML) 0.1 % ophthalmic suspension Place 1 drop into both eyes daily.  11/05/15   [provider]  lactose free nutrition (BOOST) LIQD Take 237 mLs by mouth 2 (two) times daily between meals.    [provider]  LORazepam (ATIVAN) 0.5 MG tablet Take 0.25 mg by mouth daily as needed for anxiety or sleep.  05/15/18   [provider]  ondansetron (ZOFRAN) 4 MG tablet Take 1 tablet (4 mg total) by mouth every 8 (eight) hours as needed for nausea or vomiting. 10/16/20   [place] Callas, NP  sodium chloride (OCEAN) 0.65 % SOLN nasal spray Place 1 spray into both nostrils 2 (two) times daily as needed for congestion.    [provider]      Allergies    Other, Ciprofloxacin, Doxycycline, Erythromycin, Hepatitis a vaccine, and Tramadol    Review of Systems   Review of Systems  Physical Exam Updated Vital Signs BP (!) 180/95   Pulse 72   Temp 97.9 F (36.6 C) (Axillary)   Resp 18   SpO2 96%  Physical Exam Constitutional:      General: She is not in acute distress. HENT:     Head: Normocephalic and atraumatic.  Eyes:     Conjunctiva/sclera: Conjunctivae normal.     Pupils: Pupils are equal, round, and reactive to light.  Cardiovascular:     Rate and Rhythm: Normal rate and regular rhythm.  Pulmonary:     Effort: Pulmonary effort is normal. No respiratory distress.  Abdominal:     General: There is no distension.     Tenderness: There is no abdominal tenderness.  Musculoskeletal:     Comments: No swelling of the  lower extremity or the knee, no peripatellar effusion, full range of motion actively of the lower extremities with limited pain.  There is some mild tenderness of the right anterior lower quadriceps tendon as well as the posterior knee and fossa.  Skin:    General: Skin is warm and dry.  Neurological:     General: No focal deficit present.     Mental Status: She is alert. Mental status is at baseline.     ED Results / Procedures / Treatments   Labs (all labs ordered are listed, but only abnormal results are displayed) Labs Reviewed  BASIC METABOLIC PANEL - Abnormal; Notable for the following components:      Result Value   Sodium 124 (*)    Potassium 3.3 (*)    Chloride 84 (*)    Glucose, Bld 128 (*)    BUN 7 (*)    All other components within  normal limits  CBC    EKG None  Radiology VAS [place] LOWER EXTREMITY VENOUS (DVT) (7a-7p)  Result Date: 05/10/2022  Lower Venous DVT Study Patient Name:  Shannon Montgomery  Date of Exam:   05/10/2022 Medical Rec #: [id number]         Accession #:    [id number] Date of Birth: [birth year]         Patient Gender: F Patient Age:   61 years Exam Location:  [facility] Procedure:      VAS [place] LOWER EXTREMITY VENOUS (DVT) Referring Phys: Muhsin Doris --------------------------------------------------------------------------------  Indications: Cramping in knee.  Comparison Study: No prior study on file Performing Technologist: Sharion Dove RVS  Examination Guidelines: A complete evaluation includes B-mode imaging, spectral Doppler, color Doppler, and power Doppler as needed of all accessible portions of each vessel. Bilateral testing is considered an integral part of a complete examination. Limited examinations for reoccurring indications may be performed as noted. The reflux portion of the exam is performed with the patient in reverse Trendelenburg.  +---------+---------------+---------+-----------+----------+--------------+ RIGHT    CompressibilityPhasicitySpontaneityPropertiesThrombus Aging +---------+---------------+---------+-----------+----------+--------------+ CFV      Full           Yes      Yes                                 +---------+---------------+---------+-----------+----------+--------------+ SFJ      Full                                                        +---------+---------------+---------+-----------+----------+--------------+ FV Prox  Full                                                        +---------+---------------+---------+-----------+----------+--------------+ FV Mid   Full                                                        +---------+---------------+---------+-----------+----------+--------------+ FV DistalFull                                                         +---------+---------------+---------+-----------+----------+--------------+  PFV      Full                                                        +---------+---------------+---------+-----------+----------+--------------+ POP      Full           Yes      Yes                                 +---------+---------------+---------+-----------+----------+--------------+ PTV      Full                                                        +---------+---------------+---------+-----------+----------+--------------+ PERO     Full                                                        +---------+---------------+---------+-----------+----------+--------------+   +----+---------------+---------+-----------+----------+--------------+ LEFTCompressibilityPhasicitySpontaneityPropertiesThrombus Aging +----+---------------+---------+-----------+----------+--------------+ CFV Full           Yes      Yes                                 +----+---------------+---------+-----------+----------+--------------+     Summary: RIGHT: - There is no evidence of deep vein thrombosis in the lower extremity.  - No cystic structure found in the popliteal fossa.  LEFT: - No evidence of common femoral vein obstruction.  *See table(s) above for measurements and observations.    Preliminary    DG Knee Complete 4 Views Right  Result Date: 05/10/2022 CLINICAL DATA:  Right knee pain and spasms. EXAM: RIGHT KNEE - COMPLETE 4+ VIEW COMPARISON:  Right knee radiographs 06/08/2013 FINDINGS: Right total knee arthroplasty is present. The knee is located. No acute bone or soft tissue abnormalities are present. No significant effusion is present. Vascular calcifications are noted. IMPRESSION: Right total knee arthroplasty without radiographic evidence for complication. Electronically Signed   By: Marin Roberts M.D.   On: 05/10/2022 12:14    Procedures Procedures     Medications Ordered in ED Medications  ibuprofen (ADVIL) tablet 600 mg (600 mg Oral Not Given 05/10/22 1521)  sodium chloride 0.9 % bolus 1,000 mL (0 mLs Intravenous Stopped 05/10/22 1643)  ketorolac (TORADOL) 30 MG/ML injection 30 mg (30 mg Intravenous Given 05/10/22 1534)  traMADol (ULTRAM) tablet 50 mg (50 mg Oral Given 05/10/22 1522)    ED Course/ Medical Decision Making/ A&P Clinical Course as of 05/10/22 1815  Sat May 10, 2022  1620 No acute dvt on scan [MT]  1633 I spoke to the patient and her husband updating both about the results of her findings.  She wants to go home and has been will pick her up and take her home.  She will follow-up with orthopedic doctor at home.  She also need to follow-up with her PCP for hyponatremia [MT]    Clinical Course User Index [MT]  Wyvonnia Dusky, MD                           Medical Decision Making Risk Prescription drug management.   Patient is here with right leg pain, differential would include fracture versus DVT versus infection versus muscle pain versus other.  X-rays were personally ordered reviewed with no acute fracture.  I have a very low suspicion for septic joint clinically.  I personally reviewed her labs, she has some chronic developing hyponatremia which has been trending down for the past several weeks.  I will give her a liter of normal saline.  Her PCP is aware of this and working with her on this issue.  However she does not have any confusion, this is a gradual decline, therefore I do not think she is requiring hospitalization at this time.  Vascular ultrasound was ordered and personally reviewed, showing no acute DVT.  Patient's pain was somewhat improved with medications.  Her husband was called to pick her up.  She will follow-up with orthopedic doctor on Monday.  As well as her PCP for hyponatremia, which, per my review of external records, has been averaging around 125 for the past 12  months.        Final Clinical Impression(s) / ED Diagnoses Final diagnoses:  Acute pain of right knee  Hyponatremia    Rx / DC Orders ED Discharge Orders     None         Wyvonnia Dusky, MD 05/10/22 1815

## 2022-05-12 ENCOUNTER — Emergency Department (HOSPITAL_COMMUNITY): Payer: Medicare Other

## 2022-05-12 ENCOUNTER — Emergency Department (HOSPITAL_COMMUNITY)
Admission: EM | Admit: 2022-05-12 | Discharge: 2022-05-12 | Disposition: A | Discharge status: Home / Self Care | Payer: Medicare Other | Attending: Emergency Medicine | Admitting: Emergency Medicine

## 2022-05-12 ENCOUNTER — Other Ambulatory Visit: Payer: Self-pay

## 2022-05-12 ENCOUNTER — Encounter (HOSPITAL_COMMUNITY): Payer: Self-pay

## 2022-05-12 DIAGNOSIS — S8991XA Unspecified injury of right lower leg, initial encounter: Secondary | ICD-10-CM | POA: Diagnosis present

## 2022-05-12 DIAGNOSIS — Z96653 Presence of artificial knee joint, bilateral: Secondary | ICD-10-CM | POA: Diagnosis not present

## 2022-05-12 DIAGNOSIS — S8001XA Contusion of right knee, initial encounter: Secondary | ICD-10-CM | POA: Diagnosis not present

## 2022-05-12 DIAGNOSIS — M25561 Pain in right knee: Secondary | ICD-10-CM

## 2022-05-12 DIAGNOSIS — X58XXXA Exposure to other specified factors, initial encounter: Secondary | ICD-10-CM | POA: Insufficient documentation

## 2022-05-12 MED ORDER — OXYCODONE-ACETAMINOPHEN 5-325 MG PO TABS: 1.0000 | ORAL_TABLET | Freq: Four times a day (QID) | ORAL | 0 refills | Status: DC | PRN

## 2022-05-12 MED ORDER — KETOROLAC TROMETHAMINE 60 MG/2ML IM SOLN
30.0000 mg | Freq: Once | INTRAMUSCULAR | Status: AC
  Administered 2022-05-12: 30 mg via INTRAMUSCULAR
  Filled 2022-05-12: qty 2

## 2022-05-12 MED ORDER — OXYCODONE-ACETAMINOPHEN 5-325 MG PO TABS
1.0000 | ORAL_TABLET | Freq: Once | ORAL | Status: AC
  Administered 2022-05-12: 1 via ORAL
  Filled 2022-05-12: qty 1

## 2022-05-12 NOTE — ED Provider Notes (Signed)
[facility]-EMERGENCY DEPT Provider Note   CSN: [id number] Arrival date & time: 05/12/22  4098     History  Chief Complaint  Patient presents with   Knee Pain    Shannon Montgomery is a 87 y.o. female with a past medical history of fibromyalgia, PMR, osteoarthritis s/p left right total knee arthroplasty in 2018 presents to the emergency department for evaluation of right knee pain.  Patient states she has had dealing with right knee pain in the last year however it has gotten worse in the last 5 days.  Patient was evaluated at [facility] ER with similar 2 days ago, she was given Toradol and tramadol for pain with minimal improvement.  Patient reports that this time the pain is a shooting pain from her right knee to her thigh. No swelling or redness noted. Patient states she has tried Tylenol and tramadol at home however it did not seem to help.  Her husband has attempted to message her orthopedics physician to make appointment over the weekend and we will attempt again earlier this morning.  Patient denies any history of DVT or PE.  No recent travel or surgery.  Workup last time with x-ray of the right knee and ultrasound was negative.  Denies fever, chest pain, shortness of breath, nausea, vomiting, bowel changes, urinary symptoms, rash.   Knee Pain     Past Medical History:  Diagnosis Date   Anxiety    Arthritis    Chronic back pain    Chronic sinusitis    Degenerative joint disease of knee, right    Fibromyalgia    GERD (gastroesophageal reflux disease)    Osteoarthritis    "full of it" (02/18/2017)   PMR (polymyalgia rheumatica) (HCC)    Wears glasses    Past Surgical History:  Procedure Laterality Date   ABSCESS DRAINAGE Right 12/2015 - 01/2016 X 2   "inside abdomen"   ANKLE HARDWARE REMOVAL Right 04/2014   "partial hardware removed"   CATARACT EXTRACTION W/ INTRAOCULAR LENS  IMPLANT, BILATERAL Bilateral    CORNEAL TRANSPLANT Bilateral 2005-2007    "twice on left side"   EYE SURGERY     FRACTURE SURGERY     I & D EXTREMITY Right 06/08/2013   Procedure: IRRIGATION AND DEBRIDEMENT EXTREMITY/RIGHT ANKLE;  Surgeon: Mable Paris, MD;  Location: [facility] OR;  Service: Orthopedics;  Laterality: Right;   IR GENERIC HISTORICAL  02/07/2016   IR RADIOLOGIST EVAL & MGMT 02/07/2016 Darrell K Allred, PA-C [facility] INTERV RAD   IR GENERIC HISTORICAL  02/21/2016   IR RADIOLOGIST EVAL & MGMT 02/21/2016 Barnetta Chapel, PA-C [facility] INTERV RAD   IR GUIDED DRAIN W CATHETER PLACEMENT  10/08/2020   IR RADIOLOGIST EVAL & MGMT  10/24/2020   JOINT REPLACEMENT     ORIF ANKLE FRACTURE Right 06/08/2013   Procedure: OPEN REDUCTION INTERNAL FIXATION (ORIF) ANKLE FRACTURE;  Surgeon: Mable Paris, MD;  Location: [facility] OR;  Service: Orthopedics;  Laterality: Right;   TOTAL KNEE ARTHROPLASTY Right 02/17/2017   TOTAL KNEE ARTHROPLASTY Right 02/17/2017   Procedure: TOTAL KNEE ARTHROPLASTY;  Surgeon: Marcene Corning, MD;  Location: [facility] OR;  Service: Orthopedics;  Laterality: Right;     Home Medications Prior to Admission medications   Medication Sig Start Date End Date Taking? Authorizing Provider  acetaminophen (TYLENOL) 500 MG tablet Take 1 tablet (500 mg total) by mouth every 8 (eight) hours as needed for mild pain or moderate pain. 10/12/20   Lonia Blood, MD  Ascorbic Acid (VITAMIN C PO) Take 2,000 Units by mouth daily.    [provider]  Calcium-Magnesium-Zinc 7726662299333-133-5 MG TABS Take 1 tablet by mouth daily.    [provider]  celecoxib (CELEBREX) 200 MG capsule Take 200 mg by mouth as directed. Take 2 capsules (400 mg) at breakfast and Take 1 capsule (200 mg) daily PRN for PAIN 07/22/18   [provider]  Cholecalciferol 2000 UNITS CAPS Take 1 capsule by mouth daily.    [provider]  cyanocobalamin 1000 MCG tablet Take 1 tablet by mouth daily.    [provider]  fluorometholone (FML) 0.1 % ophthalmic  suspension Place 1 drop into both eyes daily.  11/05/15   [provider]  lactose free nutrition (BOOST) LIQD Take 237 mLs by mouth 2 (two) times daily between meals.    [provider]  LORazepam (ATIVAN) 0.5 MG tablet Take 0.25 mg by mouth daily as needed for anxiety or sleep. 05/15/18   [provider]  ondansetron (ZOFRAN) 4 MG tablet Take 1 tablet (4 mg total) by mouth every 8 (eight) hours as needed for nausea or vomiting. 10/16/20   Blanchard Kelchixon, Stephanie N, NP  oxyCODONE-acetaminophen (PERCOCET/ROXICET) 5-325 MG tablet Take 1 tablet by mouth every 6 (six) hours as needed for up to 8 doses for severe pain. 05/12/22   Jeanelle MallingLe, Ramar Nobrega, PA  sodium chloride (OCEAN) 0.65 % SOLN nasal spray Place 1 spray into both nostrils 2 (two) times daily as needed for congestion.    [provider]      Allergies    Other, Ciprofloxacin, Doxycycline, Erythromycin, Hepatitis a vaccine, and Tramadol    Review of Systems   Review of Systems Negative except as per HPI.  Physical Exam Updated Vital Signs BP (!) 142/92 (BP Location: Left Arm)   Pulse 85   Temp 98.2 F (36.8 C) (Oral)   Resp 16   SpO2 97%  Physical Exam Vitals and nursing note reviewed.  Constitutional:      Appearance: Normal appearance.  HENT:     Head: Normocephalic and atraumatic.     Mouth/Throat:     Mouth: Mucous membranes are moist.  Eyes:     General: No scleral icterus. Cardiovascular:     Rate and Rhythm: Normal rate and regular rhythm.     Pulses: Normal pulses.     Heart sounds: Normal heart sounds.  Pulmonary:     Effort: Pulmonary effort is normal.     Breath sounds: Normal breath sounds.  Abdominal:     General: Abdomen is flat.     Palpations: Abdomen is soft.     Tenderness: There is no abdominal tenderness.  Musculoskeletal:        General: No deformity.     Comments: There wa mild TTP to R anterior and posterior knee. R knee with full ROM. No swelling. There is a small bruise to  the R knee.  Skin:    General: Skin is warm.     Findings: No rash.  Neurological:     General: No focal deficit present.     Mental Status: She is alert.  Psychiatric:        Mood and Affect: Mood normal.     ED Results / Procedures / Treatments   Labs (all labs ordered are listed, but only abnormal results are displayed) Labs Reviewed - No data to display  EKG None  Radiology CT Knee Right Wo Contrast  Result Date: 05/12/2022 CLINICAL  DATA:  Chronic knee pain, negative x-ray. Patient unable to bear weight. EXAM: CT OF THE RIGHT KNEE WITHOUT CONTRAST TECHNIQUE: Multidetector CT imaging of the right knee was performed according to the standard protocol. Multiplanar CT image reconstructions were also generated. RADIATION DOSE REDUCTION: This exam was performed according to the departmental dose-optimization program which includes automated exposure control, adjustment of the mA and/or kV according to patient size and/or use of iterative reconstruction technique. COMPARISON:  Radiographs dated May 10, 2022. FINDINGS: Bones/Joint/Cartilage Status post right knee arthroplasty with intact hardware. Periprosthetic fracture or evidence of loosening. No suspicious osseous lesion. Ligaments Suboptimally assessed by CT. Muscles and Tendons Visualized muscles are normal in bulk. No intramuscular hematoma or fluid collection. Soft tissues Skin and subcutaneous soft tissues are within normal limits. No fluid collection or hematoma. Prominent vascular calcifications. IMPRESSION: 1. Status post right knee arthroplasty with intact hardware. No periprosthetic fracture or evidence of loosening. 2. No acute osseous abnormality. 3. Muscles and subcutaneous soft tissues are within normal limits. 4. Prominent vascular calcifications. Electronically Signed   By: Larose Hires D.O.   On: 05/12/2022 11:43   VAS [place] LOWER EXTREMITY VENOUS (DVT) (7a-7p)  Result Date: 05/11/2022  Lower Venous DVT Study Patient  Name:  HEIRESS WILLIAMSON  Date of Exam:   05/10/2022 Medical Rec #: [id number]         Accession #:    [id number] Date of Birth: May 30, 1934         Patient Gender: F Patient Age:   71 years Exam Location:  [facility] Procedure:      VAS [place] LOWER EXTREMITY VENOUS (DVT) Referring Phys: MATTHEW TRIFAN --------------------------------------------------------------------------------  Indications: Cramping in knee.  Comparison Study: No prior study on file Performing Technologist: Sherren Kerns RVS  Examination Guidelines: A complete evaluation includes B-mode imaging, spectral Doppler, color Doppler, and power Doppler as needed of all accessible portions of each vessel. Bilateral testing is considered an integral part of a complete examination. Limited examinations for reoccurring indications may be performed as noted. The reflux portion of the exam is performed with the patient in reverse Trendelenburg.  +---------+---------------+---------+-----------+----------+--------------+ RIGHT    CompressibilityPhasicitySpontaneityPropertiesThrombus Aging +---------+---------------+---------+-----------+----------+--------------+ CFV      Full           Yes      Yes                                 +---------+---------------+---------+-----------+----------+--------------+ SFJ      Full                                                        +---------+---------------+---------+-----------+----------+--------------+ FV Prox  Full                                                        +---------+---------------+---------+-----------+----------+--------------+ FV Mid   Full                                                        +---------+---------------+---------+-----------+----------+--------------+  FV DistalFull                                                        +---------+---------------+---------+-----------+----------+--------------+ PFV      Full                                                         +---------+---------------+---------+-----------+----------+--------------+ POP      Full           Yes      Yes                                 +---------+---------------+---------+-----------+----------+--------------+ PTV      Full                                                        +---------+---------------+---------+-----------+----------+--------------+ PERO     Full                                                        +---------+---------------+---------+-----------+----------+--------------+   +----+---------------+---------+-----------+----------+--------------+ LEFTCompressibilityPhasicitySpontaneityPropertiesThrombus Aging +----+---------------+---------+-----------+----------+--------------+ CFV Full           Yes      Yes                                 +----+---------------+---------+-----------+----------+--------------+     Summary: RIGHT: - There is no evidence of deep vein thrombosis in the lower extremity.  - No cystic structure found in the popliteal fossa.  LEFT: - No evidence of common femoral vein obstruction.  *See table(s) above for measurements and observations. Electronically signed by Heath Lark on 05/11/2022 at 11:16:44 AM.    Final     Procedures Procedures    Medications Ordered in ED Medications  oxyCODONE-acetaminophen (PERCOCET/ROXICET) 5-325 MG per tablet 1 tablet (1 tablet Oral Given 05/12/22 0718)  ketorolac (TORADOL) injection 30 mg (30 mg Intramuscular Given 05/12/22 1032)    ED Course/ Medical Decision Making/ A&P                           Medical Decision Making Amount and/or Complexity of Data Reviewed Radiology: ordered.  Risk Prescription drug management.   This patient presents to the ED for R knee pain, this involves an extensive number of treatment options, and is a complaint that carries with a high risk of complications and morbidity.  The differential diagnosis  includes fracture, dislocation, musculoskeletal pain.  This is not an exhaustive list.  Comorbidities that complicate the patient evaluation See HPI  Social determinants of health NA  Additional history obtained: External records from outside source obtained and reviewed including: Chart review including previous notes, labs,  imaging.  Cardiac monitoring/EKG: The patient was maintained on a cardiac monitor.  I personally reviewed and interpreted the cardiac monitor which showed an underlying rhythm of: Sinus rhythm.  Lab tests: Labs were done 2 days ago with mild hyponatremia.  Imaging studies: I ordered imaging studies. I personally reviewed, interpreted imaging and agree with the radiologist's interpretations. Findings include: CT scan of knee with no acute abnormalities.   Problem list/ ED course/ Critical interventions/ Medical management: HPI: See above Vital signs within normal range and stable throughout visit. Laboratory/imaging studies significant for: See above. On physical examination, patient is afebrile and appears in no acute distress. There was tenderness to palpation to the R knee. There was a small bruise on the R knee that patient states was due to bumping her knee to the equipment during imaging studies in her last visit. CT scan of the knee with no acute abnormalities. I personally helped with ambulating patient in the room. She was able to bear weight on the R knee and took a couple of steps with some difficulty. [place] study done 2 days ago with no evidence of DVT. Based on patient's clinical presentations and laboratory/imaging studies I suspect musculoskeletal pain. I ordered Percocet and Toradol for pain. Reevaluation of the patient after these medications showed that the patient improved. I ordered consultation to transition of care team so they can arrange PT/OT for patient at home. Advised patient to take pain medications. Follow up with primary care physician for  further evaluation and management. Return to the ER if new or worsening symptoms. I have reviewed the patient home medicines and have made adjustments as needed.  Consultations obtained: I requested consultation with Dr. Rosalia Hammers, and discussed lab and imaging findings as well as pertinent plan.  He/she agrees with the plan.  Disposition Continued outpatient therapy. Follow-up with orthopedics recommended for reevaluation of symptoms. Treatment plan discussed with patient.  Pt acknowledged understanding was agreeable to the plan. Worrisome signs and symptoms were discussed with patient, and patient acknowledged understanding to return to the ED if they noticed these signs and symptoms. Patient was stable upon discharge.   This chart was dictated using voice recognition software.  Despite best efforts to proofread,  errors can occur which can change the documentation meaning.          Final Clinical Impression(s) / ED Diagnoses Final diagnoses:  Acute pain of right knee    Rx / DC Orders ED Discharge Orders          Ordered    oxyCODONE-acetaminophen (PERCOCET/ROXICET) 5-325 MG tablet  Every 6 hours PRN,   Status:  Discontinued        05/12/22 1400    oxyCODONE-acetaminophen (PERCOCET/ROXICET) 5-325 MG tablet  Every 6 hours PRN        05/12/22 1408              Jeanelle Malling, [place] 05/12/22 2009    Margarita Grizzle, MD 05/16/22 1101

## 2022-05-12 NOTE — ED Notes (Signed)
An After Visit Summary was printed and given to the patient. Discharge instructions given and no further questions at this time.  Pt leaving with husband. Pt husband did not want to wait for PTAR. Pt assisted to vehicle with staff.

## 2022-05-12 NOTE — ED Triage Notes (Signed)
Pt. BIB [facility] for knee pain since Thursday. Pt. Was seen at [facility] Friday. Husband attempted to stand her in and was unsuccessful. Pt. Has an ortho appointment scheduled for next week.  EMS VS: Hr: 70 BP: 150/109 RR: 22

## 2022-05-12 NOTE — Discharge Instructions (Addendum)
Please take your medications as prescribed. Transition of care team will call to assist you with your need. I recommend close follow-up with orthopedics for reevaluation.  Please do not hesitate to return to emergency department if worrisome signs symptoms we discussed become apparent.

## 2022-05-12 NOTE — TOC Transition Note (Addendum)
Transition of Care [facility]) - CM/SW Discharge Note   Patient Details  Name: Shannon Montgomery MRN: [id number] Date of Birth: [birth year]  Transition of Care [facility] CM/SW Contact:  Lavenia Atlas, RN Phone Number: 05/12/2022, 3:28 PM   Clinical Narrative:   Received TOC consult for HHPT/OT. This RNCM spoke with patient at bedside to offer choice for HHPT/OT. Patient wants to use previous agency that her husband used however unfamiliar with the name of the home health agency. Patient is agreeable to use any home health agency that accepts her insurance. This RNCM spoke with Morrie Sheldon at [facility] who will follow patient. This RNCM notified EDP to request HHPT/OT orders. Awaiting HHPT/OT orders.  Transportation at discharge: Patient repots her husband will take care of transportation home.  - 3:45pm No additional TOC needs at this time.  Final next level of care: Home w Home Health Services Barriers to Discharge: No Barriers Identified   Patient Goals and CMS Choice Patient states their goals for this hospitalization and ongoing recovery are:: return home CMS Medicare.gov Compare Post Acute Care list provided to:: Patient Choice offered to / list presented to : Patient  Discharge Placement                       Discharge Plan and Services In-house Referral: NA Discharge Planning Services: CM Consult Post Acute Care Choice: Home Health          DME Arranged: N/A DME Agency: NA       HH Arranged: OT, PT HH Agency: [facility] Date HH Agency Contacted: 05/12/22 Time HH Agency Contacted: 1524 Representative spoke with at [facility] Agency: Morrie Sheldon  Social Determinants of Health (SDOH) Interventions     Readmission Risk Interventions     No data to display

## 2022-05-21 ENCOUNTER — Telehealth: Payer: Self-pay

## 2022-05-21 NOTE — Telephone Encounter (Signed)
        Patient  visited [place] on 12/18    Telephone encounter attempt :  1st  A HIPAA compliant voice message was left requesting a return call.  Instructed patient to call back     [name] [facility] Guide, , Care Management  [phone] [address], [place], [place] [address] Phone: [phone] Email: [name]       

## 2022-05-22 ENCOUNTER — Telehealth: Payer: Self-pay

## 2022-05-22 NOTE — Telephone Encounter (Signed)
     Patient  visit on 12/18  at [facility]    Have you been able to follow up with your primary care physician? No   The patient was or was not able to obtain any needed medicine or equipment. Yes   Are there diet recommendations that you are having difficulty following? Na   Patient expresses understanding of discharge instructions and education provided has no other needs at this time.  Yes      Lenard [facility] Guide, [facility], Care Management  [phone] [address] [place], [place], [place] [address] Phone: [phone] Email: [email or web address]

## 2022-06-18 ENCOUNTER — Ambulatory Visit: Payer: Medicare Other | Admitting: Family Medicine

## 2022-06-26 ENCOUNTER — Encounter: Payer: Self-pay | Admitting: Family Medicine

## 2022-06-26 ENCOUNTER — Ambulatory Visit (INDEPENDENT_AMBULATORY_CARE_PROVIDER_SITE_OTHER): Payer: Medicare Other | Admitting: Family Medicine

## 2022-06-26 VITALS — BP 114/78 | HR 77 | Temp 97.8°F | Ht 61.0 in | Wt 132.0 lb

## 2022-06-26 DIAGNOSIS — G8929 Other chronic pain: Secondary | ICD-10-CM | POA: Insufficient documentation

## 2022-06-26 DIAGNOSIS — G894 Chronic pain syndrome: Secondary | ICD-10-CM | POA: Insufficient documentation

## 2022-06-26 DIAGNOSIS — R519 Headache, unspecified: Secondary | ICD-10-CM | POA: Diagnosis not present

## 2022-06-26 DIAGNOSIS — F419 Anxiety disorder, unspecified: Secondary | ICD-10-CM | POA: Diagnosis not present

## 2022-06-26 DIAGNOSIS — M1711 Unilateral primary osteoarthritis, right knee: Secondary | ICD-10-CM

## 2022-06-26 NOTE — Progress Notes (Signed)
Assessment/Plan:   Problem List Items Addressed This Visit       Musculoskeletal and Integument   Primary osteoarthritis of right knee (Chronic)    Ongoing osteoarthritis in multiple joints contributing to chronic pain and reduced mobility. Pain management with tramadol provides partial relief.  Differential diagnosis: - Osteoarthritis of multiple sites - Degenerative joint disease  Plan: Considering the patient's age and sensitivity to medications, continue with the current low-dose tramadol regimen for pain management. Consider the addition of lidocaine patches for targeted pain relief, particularly at the knee and reconstructed ankle where it has shown efficacy. Investigate non-pharmacologic interventions such as physical therapy, with the possibility of in-home therapy sessions to improve mobility and function without over-reliance on medications. Monitor for side effects and efficacy.       Relevant Medications   traMADol (ULTRAM) 50 MG tablet     Other   Chronic nonintractable headache    Patient reports morning headaches resolving by midday. Differential diagnoses not established. A more detailed history and possibly imaging may be needed if headaches persist or worsen.  Plan: Observe headache pattern with consideration for potential triggers such as medication effects, sleep disturbances, or tension. Recommend maintaining a headache diary to help identify patterns or triggers. Defer further investigation until more information becomes available but consider neurology referral if no improvement.      Relevant Medications   traMADol (ULTRAM) 50 MG tablet   Anxiety    The patient currently manages on a low dose of lorazepam.  Plan: Refer the patient to a therapist specializing in chronic pain and anxiety management, potentially allowing for decrease in lorazepam use. Therapy can offer coping strategies for pain and anxiety without additional pharmacological intervention.       Relevant Orders   Ambulatory referral to Behavioral Health   Chronic pain syndrome - Primary   Relevant Medications   traMADol (ULTRAM) 50 MG tablet    Medications Discontinued During This Encounter  Medication Reason   celecoxib (CELEBREX) 200 MG capsule    sodium chloride (OCEAN) 0.65 % SOLN nasal spray    Ascorbic Acid (VITAMIN C PO)    acetaminophen (TYLENOL) 500 MG tablet    oxyCODONE-acetaminophen (PERCOCET/ROXICET) 5-325 MG tablet    Calcium-Magnesium-Zinc 333-133-5 MG TABS    Cholecalciferol 2000 UNITS CAPS    cyanocobalamin 1000 MCG tablet    fluorometholone (FML) 0.1 % ophthalmic suspension    lactose free nutrition (BOOST) LIQD    ondansetron (ZOFRAN) 4 MG tablet       Subjective:  HPI: Encounter date: 06/26/2022  Shannon Montgomery is a 88 y.o. female who has Vitamin D deficiency; URI; Esophagitis; OTHER MALAISE AND FATIGUE; Ankle fracture; Open right ankle fracture; Hyponatremia; Fibromyalgia; PMR (polymyalgia rheumatica) ([place]); Liver lesion, right lobe; Pleural effusion on right; Leukocytosis; Primary osteoarthritis of right knee; Retroperitoneal abscess ([place]); Chronic nonintractable headache; Anxiety; and Chronic pain syndrome on their problem list..   She  has a past medical history of Anxiety, Arthritis, Chronic back pain, Chronic sinusitis, Degenerative joint disease of knee, right, Fibromyalgia, GERD (gastroesophageal reflux disease), Osteoarthritis, PMR (polymyalgia rheumatica) ([place]), and Wears glasses..    CHIEF COMPLAINT: Patient presents with concerns regarding back pain, osteoarthritis, and headaches.  HISTORY OF PRESENT ILLNESS: Problem 1: The patient, an 88 year old female, reports a history of back pain attributed to osteoarthritis, which has been partially managed with tramadol but without significant relief. She mentions the pain involves the entire body and varies in location, with recent aggravation in the  knee leading to hospital visits for band  spasms. Problem 2: Additionally, the patient is concerned about persistent headaches that seem to resolve by noon each day. She associates the onset with waking and seeks an intervention to alleviate these symptoms.  Problem 3: The patient is struggling with anxiety and wonders if this contributes to her pain. She is currently managed on a low dose of lorazepam which she does not wish to increase.  REVIEW OF SYSTEMS: General: Reports persistent pain and headache, but no reports of fever, weight loss, or fatigue.  Neurological: Describes headaches, but denies dizziness, syncope, or weakness. Musculoskeletal: An ongoing problem with osteoarthritis and associated body aches. Difficulty with mobility due to knee issues. Psychiatric: Experiences anxiety, presently controlled with lorazepam.  Formerly active with tennis and dancing but now limited by pain and mobility issues. 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  Past Surgical History:  Procedure Laterality Date   ABSCESS DRAINAGE Right 12/2015 - 01/2016 X 2   "inside abdomen"   ANKLE HARDWARE REMOVAL Right 04/2014   "partial hardware removed"   CATARACT EXTRACTION W/ INTRAOCULAR LENS  IMPLANT, BILATERAL Bilateral    CORNEAL TRANSPLANT Bilateral 2005-2007   "twice on left side"   EYE SURGERY     FRACTURE SURGERY     I & D EXTREMITY Right 06/08/2013   Procedure: IRRIGATION AND DEBRIDEMENT EXTREMITY/RIGHT ANKLE;  Surgeon: Nita Sells, MD;  Location: [facility];  Service: Orthopedics;  Laterality: Right;   IR GENERIC HISTORICAL  02/07/2016   IR RADIOLOGIST EVAL & MGMT 02/07/2016 Darrell K Allred, PA-C [facility] INTERV RAD   IR GENERIC HISTORICAL  02/21/2016   IR RADIOLOGIST EVAL & MGMT 02/21/2016 Saverio Danker, PA-C [facility] INTERV RAD   IR GUIDED DRAIN W CATHETER PLACEMENT  10/08/2020   IR RADIOLOGIST EVAL & MGMT  10/24/2020   JOINT REPLACEMENT     ORIF ANKLE FRACTURE Right 06/08/2013   Procedure: OPEN REDUCTION  INTERNAL FIXATION (ORIF) ANKLE FRACTURE;  Surgeon: Nita Sells, MD;  Location: [facility];  Service: Orthopedics;  Laterality: Right;   TOTAL KNEE ARTHROPLASTY Right 02/17/2017   TOTAL KNEE ARTHROPLASTY Right 02/17/2017   Procedure: TOTAL KNEE ARTHROPLASTY;  Surgeon: Melrose Nakayama, MD;  Location: [facility];  Service: Orthopedics;  Laterality: Right;    Outpatient Medications Prior to Visit  Medication Sig Dispense Refill   LORazepam (ATIVAN) 0.5 MG tablet Take 0.25 mg by mouth daily as needed for anxiety or sleep.     Multiple Vitamins-Minerals (CENTRUM ADULT PO) Take by mouth.     traMADol (ULTRAM) 50 MG tablet Take 50-100 mg by mouth every 6 (six) hours as needed.     acetaminophen (TYLENOL) 500 MG tablet Take 1 tablet (500 mg total) by mouth every 8 (eight) hours as needed for mild pain or moderate pain. 30 tablet 0   Ascorbic Acid (VITAMIN C PO) Take 2,000 Units by mouth daily.     Calcium-Magnesium-Zinc 333-133-5 MG TABS Take 1 tablet by mouth daily. (Patient not taking: Reported on 06/26/2022)     celecoxib (CELEBREX) 200 MG capsule Take 200 mg by mouth as directed. Take 2 capsules (400 mg) at breakfast and Take 1 capsule (200 mg) daily PRN for PAIN     Cholecalciferol 2000 UNITS CAPS Take 1 capsule by mouth daily. (Patient not taking: Reported on 06/26/2022)     cyanocobalamin 1000 MCG tablet Take 1 tablet by mouth daily. (Patient not taking: Reported on 06/26/2022)  fluorometholone (FML) 0.1 % ophthalmic suspension Place 1 drop into both eyes daily.  (Patient not taking: Reported on 06/26/2022)  6   lactose free nutrition (BOOST) LIQD Take 237 mLs by mouth 2 (two) times daily between meals. (Patient not taking: Reported on 06/26/2022)     ondansetron (ZOFRAN) 4 MG tablet Take 1 tablet (4 mg total) by mouth every 8 (eight) hours as needed for nausea or vomiting. (Patient not taking: Reported on 06/26/2022) 60 tablet 0   oxyCODONE-acetaminophen (PERCOCET/ROXICET) 5-325 MG tablet Take 1 tablet by  mouth every 6 (six) hours as needed for up to 8 doses for severe pain. 8 tablet 0   sodium chloride (OCEAN) 0.65 % SOLN nasal spray Place 1 spray into both nostrils 2 (two) times daily as needed for congestion.     No facility-administered medications prior to visit.    Family History  Problem Relation Age of Onset   Breast cancer Mother    Anuerysm Father    Diabetes Brother    Brain cancer Brother    Alcoholism Brother     Social History   Socioeconomic History   Marital status: Married    Spouse name: Not on file   Number of children: Not on file   Years of education: Not on file   Highest education level: Not on file  Occupational History   Not on file  Tobacco Use   Smoking status: Never    Passive exposure: Never   Smokeless tobacco: Never  Vaping Use   Vaping Use: Never used  Substance and Sexual Activity   Alcohol use: No   Drug use: No   Sexual activity: Never    Birth control/protection: None  Other Topics Concern   Not on file  Social History Narrative   Not on file   Social Determinants of Health   Financial Resource Strain: Not on file  Food Insecurity: Not on file  Transportation Needs: Not on file  Physical Activity: Not on file  Stress: Not on file  Social Connections: Not on file  Intimate Partner Violence: Not on file                                                                                                 Objective:  Physical Exam: BP 114/78 (BP Location: Left Arm, Patient Position: Sitting, Cuff Size: Large)   Pulse 77   Temp 97.8 F (36.6 C) (Temporal)   Ht 5\' 1"  (1.549 m)   Wt 132 lb (59.9 kg)   SpO2 99%   BMI 24.94 kg/m    General: No acute distress. Awake and conversant.  Patient is wheelchair Eyes: Normal conjunctiva, anicteric. Round symmetric pupils.  ENT: Hearing grossly intact. No nasal discharge.  Neck: Neck is supple. No masses or thyromegaly.  Respiratory: Respirations are non-labored. No auditory wheezing.   Skin: Warm. No rashes or ulcers.  Psych: Alert and oriented. Cooperative, Appropriate mood and affect, Normal judgment.  CV: No cyanosis or JVD MSK: Normal ambulation. No clubbing  Neuro: Sensation and CN II-XII grossly normal.   LABS: Past labs show low sodium,  with recent values mirroring those from the previous year. All other pertinent lab work reviewed and no additional significant findings noted.  IMAGING: Section reviewed. The patient has a history of herniated discs in the back, and recent CT scans in the context of her hospital visit are noted but no new imaging reported during this encounter.      Alesia Banda, MD, MS

## 2022-06-26 NOTE — Patient Instructions (Signed)
It was a pleasure to meet you today. We are referring to [facility] Counsel[facility], office will calll to coordinate.

## 2022-06-26 NOTE — Assessment & Plan Note (Signed)
Patient reports morning headaches resolving by midday. Differential diagnoses not established. A more detailed history and possibly imaging may be needed if headaches persist or worsen.  Plan: Observe headache pattern with consideration for potential triggers such as medication effects, sleep disturbances, or tension. Recommend maintaining a headache diary to help identify patterns or triggers. Defer further investigation until more information becomes available but consider neurology referral if no improvement.

## 2022-06-26 NOTE — Assessment & Plan Note (Signed)
The patient currently manages on a low dose of lorazepam.  Plan: Refer the patient to a therapist specializing in chronic pain and anxiety management, potentially allowing for decrease in lorazepam use. Therapy can offer coping strategies for pain and anxiety without additional pharmacological intervention.

## 2022-06-26 NOTE — Assessment & Plan Note (Signed)
Ongoing osteoarthritis in multiple joints contributing to chronic pain and reduced mobility. Pain management with tramadol provides partial relief.  Differential diagnosis: - Osteoarthritis of multiple sites - Degenerative joint disease  Plan: Considering the patient's age and sensitivity to medications, continue with the current low-dose tramadol regimen for pain management. Consider the addition of lidocaine patches for targeted pain relief, particularly at the knee and reconstructed ankle where it has shown efficacy. Investigate non-pharmacologic interventions such as physical therapy, with the possibility of in-home therapy sessions to improve mobility and function without over-reliance on medications. Monitor for side effects and efficacy.

## 2022-06-30 ENCOUNTER — Telehealth: Payer: Self-pay | Admitting: Family Medicine

## 2022-06-30 NOTE — Telephone Encounter (Signed)
At the [facility]

## 2022-06-30 NOTE — Telephone Encounter (Signed)
Gwenlyn Perking stated that he spoke with 3 people regarding scheduling patient and never got scheduled. I told Gwenlyn Perking I would reach out to the mood center for further information.   I spoke with Katie at the [facility] and she stated that they received the referral and they don't have availability this week, but she does have patient's information to contact them when they have availability. She stated that the schedule is released weekly. I verbalized understanding.   I reached back out to Nibbe and advised him about annotation above and he verbalized understanding.

## 2022-06-30 NOTE — Telephone Encounter (Signed)
Please call the pt. He said he called the office that dr Grandville Silos referred him to and no one will answer the phone . Pt stated he tried to call several times

## 2022-07-15 ENCOUNTER — Encounter: Payer: Self-pay | Admitting: Family Medicine

## 2022-07-15 ENCOUNTER — Ambulatory Visit (INDEPENDENT_AMBULATORY_CARE_PROVIDER_SITE_OTHER): Payer: Medicare Other | Admitting: Family Medicine

## 2022-07-15 VITALS — BP 120/76 | HR 75 | Temp 97.9°F | Wt 133.6 lb

## 2022-07-15 DIAGNOSIS — J0110 Acute frontal sinusitis, unspecified: Secondary | ICD-10-CM | POA: Insufficient documentation

## 2022-07-15 DIAGNOSIS — R519 Headache, unspecified: Secondary | ICD-10-CM | POA: Diagnosis not present

## 2022-07-15 DIAGNOSIS — F419 Anxiety disorder, unspecified: Secondary | ICD-10-CM

## 2022-07-15 DIAGNOSIS — R051 Acute cough: Secondary | ICD-10-CM | POA: Insufficient documentation

## 2022-07-15 DIAGNOSIS — G8929 Other chronic pain: Secondary | ICD-10-CM

## 2022-07-15 HISTORY — DX: Acute frontal sinusitis, unspecified: J01.10

## 2022-07-15 MED ORDER — BENZONATATE 100 MG PO CAPS: 100.0000 mg | ORAL_CAPSULE | Freq: Two times a day (BID) | ORAL | 0 refills | Status: DC | PRN

## 2022-07-15 MED ORDER — AMOXICILLIN 500 MG PO CAPS: 500.0000 mg | ORAL_CAPSULE | Freq: Two times a day (BID) | ORAL | 0 refills | Status: AC

## 2022-07-15 MED ORDER — ACETAMINOPHEN ER 650 MG PO TBCR: 650.0000 mg | EXTENDED_RELEASE_TABLET | Freq: Three times a day (TID) | ORAL | Status: DC | PRN

## 2022-07-15 MED ORDER — FLUTICASONE PROPIONATE 50 MCG/ACT NA SUSP: 2.0000 | Freq: Every day | NASAL | 6 refills | Status: DC

## 2022-07-15 NOTE — Assessment & Plan Note (Signed)
The patient presents with symptoms consistent with acute frontal sinusitis characterized by a persistent cough and absence of systemic symptoms.  Initiate Fluticasone nasal spray to manage inflammation and congestion. Continue with sporadic saline nasal rinse, ensuring not to flush out the Fluticasone immediately after its use. Tylenol for headache as needed; reassess after sinus symptoms are addressed. Consider Amoxicillin if symptoms are unimproved and suggestive of bacterial sinusitis, but delay unless Flonase does not result in improvement. Re-evaluate for further management if symptoms persist beyond a week.

## 2022-07-15 NOTE — Progress Notes (Signed)
Assessment/Plan:  At today's visit, we discussed treatment options, associated risk and benefits, and engage in counseling as needed.  Additionally the following were reviewed: Past medical records, past medical and surgical history, family and social background, as well as relevant laboratory results, imaging findings, and specialty notes, where applicable.  This message was generated using dictation software, and as a result, it may contain unintentional typos or errors.  Nevertheless, extensive effort was made to accurately convey at the pertinent aspects of the patient visit.    There may have been are other unrelated non-urgent complaints, but due to the busy schedule and the amount of time already spent with her, time does not permit to address these issues at today's visit. Another appointment may have or has been requested to review these additional issues.  Problem List Items Addressed This Visit       Respiratory   Acute non-recurrent frontal sinusitis - Primary    The patient presents with symptoms consistent with acute frontal sinusitis characterized by a persistent cough and absence of systemic symptoms.  Initiate Fluticasone nasal spray to manage inflammation and congestion. Continue with sporadic saline nasal rinse, ensuring not to flush out the Fluticasone immediately after its use. Tylenol for headache as needed; reassess after sinus symptoms are addressed. Consider Amoxicillin if symptoms are unimproved and suggestive of bacterial sinusitis, but delay unless Flonase does not result in improvement. Re-evaluate for further management if symptoms persist beyond a week.      Relevant Medications   fluticasone (FLONASE) 50 MCG/ACT nasal spray   benzonatate (TESSALON) 100 MG capsule   amoxicillin (AMOXIL) 500 MG capsule   acetaminophen (TYLENOL 8 HOUR) 650 MG CR tablet     Other   Chronic nonintractable headache   Relevant Medications   acetaminophen (TYLENOL 8 HOUR) 650  MG CR tablet   Anxiety   Acute cough    Likely secondary to post-nasal drip; nighttime coughs can been addressed with Tessalon Perles (benzonatate) as needed. Monitor for improvement with the use of Fluticasone nasal spray, as it may reduce post-nasal drip and cough.      Relevant Medications   fluticasone (FLONASE) 50 MCG/ACT nasal spray   benzonatate (TESSALON) 100 MG capsule   amoxicillin (AMOXIL) 500 MG capsule   acetaminophen (TYLENOL 8 HOUR) 650 MG CR tablet    There are no discontinued medications.    Subjective:  HPI: Encounter date: 07/15/2022  Shannon Montgomery is a 88 y.o. female who has Vitamin D deficiency; URI; Esophagitis; OTHER MALAISE AND FATIGUE; Ankle fracture; Open right ankle fracture; Hyponatremia; Fibromyalgia; PMR (polymyalgia rheumatica) ([place]); Liver lesion, right lobe; Pleural effusion on right; Leukocytosis; Primary osteoarthritis of right knee; Retroperitoneal abscess ([place]); Chronic nonintractable headache; Anxiety; Chronic pain syndrome; Acute non-recurrent frontal sinusitis; and Acute cough on their problem list..   She  has a past medical history of Anxiety, Arthritis, Chronic back pain, Chronic sinusitis, Degenerative joint disease of knee, right, Fibromyalgia, GERD (gastroesophageal reflux disease), Osteoarthritis, PMR (polymyalgia rheumatica) ([place]), and Wears glasses..   Chief Complaint: Cough, nasal congestion, sinus pressure, and headaches  History of Present Illness:  Shannon Montgomery presents with multiple complaints that have been persistent for over two weeks.    Cough.  She reports a cough that worsens in the evening and affects her sleep. Additionally, she mentions a notable adverse reaction to Ipratropium nasal spray leading to a nosebleed, indicating the need for an alternative treatment approach. The cough is described as non-productive with no wheezing.  Nasal congestion.  The patient also reports clear mucus with persistent sinus pressure which  intensifies with coughing and is accompanied by headaches, particularly in the morning. She describes nasal congestion which primarily occurs in the evenings, and she has been using nasal saline for symptom relief, although it has been causing irritation due to recent nasal symptoms.  Headache.  Her headaches tend to resolve by noon, and she has been using Tylenol and CBD gummies intermittently for pain relief. The patient also mentions memory lapses and occasional nocturnal nausea associated with phlegm and body pain.  Anxiety.  Shannon Montgomery is currently attending a mood intervention center for anxiety and has noticed improvements from the sessions. She takes a quarter milligram of Ativan (lorazepam) at night for anxiety.  Given the acute presentation for patient's visit, there is shared decision-making, we elected to follow-up on patient's acute sinusitis and focus on patient's other complaints at follow-up visit.  Past Surgical History:  Procedure Laterality Date   ABSCESS DRAINAGE Right 12/2015 - 01/2016 X 2   "inside abdomen"   ANKLE HARDWARE REMOVAL Right 04/2014   "partial hardware removed"   CATARACT EXTRACTION W/ INTRAOCULAR LENS  IMPLANT, BILATERAL Bilateral    CORNEAL TRANSPLANT Bilateral 2005-2007   "twice on left side"   EYE SURGERY     FRACTURE SURGERY     I & D EXTREMITY Right 06/08/2013   Procedure: IRRIGATION AND DEBRIDEMENT EXTREMITY/RIGHT ANKLE;  Surgeon: Nita Sells, MD;  Location: [facility];  Service: Orthopedics;  Laterality: Right;   IR GENERIC HISTORICAL  02/07/2016   IR RADIOLOGIST EVAL & MGMT 02/07/2016 Darrell K Allred, PA-C [facility] INTERV RAD   IR GENERIC HISTORICAL  02/21/2016   IR RADIOLOGIST EVAL & MGMT 02/21/2016 Saverio Danker, PA-C [facility] INTERV RAD   IR GUIDED DRAIN W CATHETER PLACEMENT  10/08/2020   IR RADIOLOGIST EVAL & MGMT  10/24/2020   JOINT REPLACEMENT     ORIF ANKLE FRACTURE Right 06/08/2013   Procedure: OPEN REDUCTION INTERNAL FIXATION (ORIF) ANKLE  FRACTURE;  Surgeon: Nita Sells, MD;  Location: [facility];  Service: Orthopedics;  Laterality: Right;   TOTAL KNEE ARTHROPLASTY Right 02/17/2017   TOTAL KNEE ARTHROPLASTY Right 02/17/2017   Procedure: TOTAL KNEE ARTHROPLASTY;  Surgeon: Melrose Nakayama, MD;  Location: [facility];  Service: Orthopedics;  Laterality: Right;    Outpatient Medications Prior to Visit  Medication Sig Dispense Refill   LORazepam (ATIVAN) 0.5 MG tablet Take 0.25 mg by mouth daily as needed for anxiety or sleep.     Multiple Vitamins-Minerals (CENTRUM ADULT PO) Take by mouth.     traMADol (ULTRAM) 50 MG tablet Take 50-100 mg by mouth every 6 (six) hours as needed.     No facility-administered medications prior to visit.    Family History  Problem Relation Age of Onset   Breast cancer Mother    Anuerysm Father    Diabetes Brother    Brain cancer Brother    Alcoholism Brother     Social History   Socioeconomic History   Marital status: Married    Spouse name: Not on file   Number of children: Not on file   Years of education: Not on file   Highest education level: Not on file  Occupational History   Not on file  Tobacco Use   Smoking status: Never    Passive exposure: Never   Smokeless tobacco: Never  Vaping Use   Vaping Use: Never used  Substance and Sexual Activity   Alcohol  use: No   Drug use: No   Sexual activity: Never    Birth control/protection: None  Other Topics Concern   Not on file  Social History Narrative   Not on file   Social Determinants of Health   Financial Resource Strain: Not on file  Food Insecurity: Not on file  Transportation Needs: Not on file  Physical Activity: Not on file  Stress: Not on file  Social Connections: Not on file  Intimate Partner Violence: Not on file                                                                                                 Objective:  Physical Exam: BP 120/76 (BP Location: Left Arm, Patient Position: Sitting, Cuff  Size: Large)   Pulse 75   Temp 97.9 F (36.6 C) (Temporal)   Wt 133 lb 9.6 oz (60.6 kg)   SpO2 99%   BMI 25.24 kg/m    Gen: NAD, resting comfortably CV: RRR with no murmurs appreciated Pulm: NWOB, CTAB with no crackles, wheezes, or rhonchi GI: Normal bowel sounds present. Soft, Nontender, Nondistended. MSK: Significant kyphosis, no edema, cyanosis, or clubbing noted Skin: warm, dry Neuro: grossly normal, moves all extremities Psych: Normal affect and thought content   Physical Exam       Alesia Banda, MD, MS

## 2022-07-15 NOTE — Patient Instructions (Signed)
For congestion, use flonase.  For cough, use tessalon perles.  Start amoxicillin if no improvement in a few days.

## 2022-07-15 NOTE — Assessment & Plan Note (Signed)
Likely secondary to post-nasal drip; nighttime coughs can been addressed with Tessalon Perles (benzonatate) as needed. Monitor for improvement with the use of Fluticasone nasal spray, as it may reduce post-nasal drip and cough.

## 2022-08-24 ENCOUNTER — Telehealth: Payer: Self-pay | Admitting: Family Medicine

## 2022-08-24 NOTE — Telephone Encounter (Signed)
Pt has discussed LORazepam (ATIVAN) 0.5 MG tablet with Dr. Grandville Silos previously, but he has not ordered for her yet. She has 2 pills left and requesting refill. Rescheduled for 09/10/2022.  Pt requesting refill on benzonatate (TESSALON) 100 MG capsule for continued cough.  Pt did not complete course of amoxicillin (AMOXIL) 500 MG capsule as it was upsetting her stomach. She does not want alternative, just wanted to notify of reaction.   [facility] [address] [place], [place]. AT [place] [address] [place] [address] Phone: [phone]  Fax: [phone]

## 2022-08-25 ENCOUNTER — Other Ambulatory Visit: Payer: Self-pay | Admitting: Family Medicine

## 2022-08-25 ENCOUNTER — Ambulatory Visit: Payer: Medicare Other | Admitting: Family Medicine

## 2022-08-25 DIAGNOSIS — R051 Acute cough: Secondary | ICD-10-CM

## 2022-08-25 DIAGNOSIS — J0110 Acute frontal sinusitis, unspecified: Secondary | ICD-10-CM

## 2022-08-25 DIAGNOSIS — F419 Anxiety disorder, unspecified: Secondary | ICD-10-CM

## 2022-08-25 NOTE — Telephone Encounter (Signed)
Preferred pharmacy: [facility] [address] [place], [place], [place] [address]  [phone]

## 2022-08-25 NOTE — Telephone Encounter (Signed)
Pt needs her Lorazapam sent to [facility] in [place] not [place]

## 2022-08-25 NOTE — Telephone Encounter (Signed)
Duplicate encounter. Previous note updated pharmacy.

## 2022-08-26 MED ORDER — LORAZEPAM 0.5 MG PO TABS: 0.2500 mg | ORAL_TABLET | Freq: Every day | ORAL | 0 refills | Status: DC | PRN

## 2022-08-26 MED ORDER — BENZONATATE 100 MG PO CAPS: 100.0000 mg | ORAL_CAPSULE | Freq: Two times a day (BID) | ORAL | 0 refills | Status: DC | PRN

## 2022-08-26 NOTE — Telephone Encounter (Signed)
Caller Name: Zamayah Call back phone #: [phone]   MEDICATION(S):  Fanny Bien   Has the patient contacted their pharmacy (YES/NO)? yes What did pharmacy advise? They did not receive the refill order. To call us back.  Preferred Pharmacy:  [facility]  Husband has called for this refill before.

## 2022-08-26 NOTE — Telephone Encounter (Signed)
Spoke with patient's husband and he stated due to insurance they would like the lorazepam sent to [facility] on [address] instead of [facility]. I updated and pended the order and deleted [facility] per patient request.  Can you please sign off.

## 2022-08-26 NOTE — Telephone Encounter (Signed)
Pt's husband is checking on status of this message. They are wanting  benzonatate (TESSALON) 100 MG capsule [id number] sent to [facility] [address] [place], [place]. AT [place] [address] [place] [address] Phone: [phone]  Fax: [phone]    LORazepam (ATIVAN) 0.5 MG tablet [id number] sent to  [facility] [id number] - [address], [address] AT [facility] OF [place], [place] [address] Phone: [phone]  Fax: [phone] DEA #: [id number]   They need these sent to two different pharmacies for insurance and payment reasons.

## 2022-08-26 NOTE — Addendum Note (Signed)
Addended by: Renaee Munda on: 08/26/2022 01:29 PM   Modules accepted: Orders

## 2022-08-26 NOTE — Telephone Encounter (Signed)
Pt's husband checking on status of this message. They need   benzonatate (TESSALON) 100 MG capsule [id number] sent to  [facility] [address] [place], [place]. AT [place] [address] [place] [address] Phone: [phone]  Fax: [phone]   LORazepam (ATIVAN) 0.5 MG tablet [id number] sent to  [facility] [id number] - [address], [address] AT [facility] OF [place], [place] [address] Phone: [phone]  Fax: [phone] DEA #: [id number]   The script's  need to be sent to different pharmacies for insurance and payment reasons.

## 2022-08-26 NOTE — Addendum Note (Signed)
Addended by: Jon Billings on: 08/26/2022 04:34 PM   Modules accepted: Orders

## 2022-08-26 NOTE — Addendum Note (Signed)
Addended by: Jon Billings on: 08/26/2022 12:34 PM   Modules accepted: Orders

## 2022-08-27 ENCOUNTER — Telehealth: Payer: Self-pay | Admitting: Family Medicine

## 2022-08-27 NOTE — Telephone Encounter (Signed)
Spoke to patient's husband and he stated that he wasn't able to pick up the medication due to insurance. I advised him I would contact the pharmacies to see why. Spoke with [facility] to make sure the lorazepam was canceled and then called [facility] in [place] to confirm patient could pick up medication and they advised that he could. I call Gwenlyn Perking back and advised him that it would be available at [facility] and they will call when ready for pick up.

## 2022-08-27 NOTE — Telephone Encounter (Signed)
Please give this pt a call today . Pt said they keep sending her medication order in wrong

## 2022-08-28 ENCOUNTER — Other Ambulatory Visit: Payer: Self-pay | Admitting: Nurse Practitioner

## 2022-08-28 MED ORDER — LORAZEPAM 0.5 MG PO TABS: 0.2500 mg | ORAL_TABLET | Freq: Two times a day (BID) | ORAL | 0 refills | Status: DC | PRN

## 2022-08-28 NOTE — Addendum Note (Signed)
Addended by: Vance Peper A on: 08/28/2022 12:01 PM   Modules accepted: Orders

## 2022-08-28 NOTE — Telephone Encounter (Signed)
Late entry from 11:45a today  I called pt husband and explained that RX was being sent in for 30 day supply to [facility].   I advised that I called [facility] to f/u on referral to psychiatry and they saw pt 07/07/2022 with a therapist not a medication provider. I told him they would be calling. He said they just called and pt declined the appointment with psychiatrist stating they only want Dr. Grandville Silos to prescribe her meds. Advised them to talk with Dr. Grandville Silos at appt 09/10/2022.

## 2022-08-28 NOTE — Telephone Encounter (Signed)
Pt husband, Gwenlyn Perking, came into office 4/4 very upset about ativan RX. He states pt takes 0.25mg  twice a day and that it was written wrong. He was very upset about only getting RX for 7 pills.   [place] and pt was seen 07/07/22 with a therapist, not a med provider. [facility] will call pt and see if she is willing to schedule appt with med provider for maintaining medication moving forward.  Can you please send 30 day supply Ativan 0.5mg , 0.25mg  twice per day to Kristopher Oppenheim at [facility]?

## 2022-09-10 ENCOUNTER — Ambulatory Visit (INDEPENDENT_AMBULATORY_CARE_PROVIDER_SITE_OTHER): Payer: Medicare Other | Admitting: Family Medicine

## 2022-09-10 ENCOUNTER — Encounter: Payer: Self-pay | Admitting: Family Medicine

## 2022-09-10 ENCOUNTER — Telehealth (HOSPITAL_BASED_OUTPATIENT_CLINIC_OR_DEPARTMENT_OTHER): Payer: Self-pay

## 2022-09-10 VITALS — BP 114/70 | HR 79 | Temp 97.8°F | Wt 135.0 lb

## 2022-09-10 DIAGNOSIS — H259 Unspecified age-related cataract: Secondary | ICD-10-CM | POA: Diagnosis not present

## 2022-09-10 DIAGNOSIS — S32030A Wedge compression fracture of third lumbar vertebra, initial encounter for closed fracture: Secondary | ICD-10-CM

## 2022-09-10 DIAGNOSIS — G894 Chronic pain syndrome: Secondary | ICD-10-CM | POA: Diagnosis not present

## 2022-09-10 DIAGNOSIS — K8020 Calculus of gallbladder without cholecystitis without obstruction: Secondary | ICD-10-CM

## 2022-09-10 DIAGNOSIS — F419 Anxiety disorder, unspecified: Secondary | ICD-10-CM | POA: Diagnosis not present

## 2022-09-10 DIAGNOSIS — K6819 Other retroperitoneal abscess: Secondary | ICD-10-CM

## 2022-09-10 DIAGNOSIS — K5730 Diverticulosis of large intestine without perforation or abscess without bleeding: Secondary | ICD-10-CM

## 2022-09-10 LAB — COMPREHENSIVE METABOLIC PANEL
ALT: 8 U/L (ref 0–35)
AST: 18 U/L (ref 0–37)
Albumin: 4.1 g/dL (ref 3.5–5.2)
Alkaline Phosphatase: 65 U/L (ref 39–117)
BUN: 15 mg/dL (ref 6–23)
CO2: 32 mEq/L (ref 19–32)
Calcium: 9.4 mg/dL (ref 8.4–10.5)
Chloride: 91 mEq/L — ABNORMAL LOW (ref 96–112)
Creatinine, Ser: 0.8 mg/dL (ref 0.40–1.20)
GFR: 66.79 mL/min (ref >60.00)
Glucose, Bld: 83 mg/dL (ref 70–99)
Potassium: 3.8 mEq/L (ref 3.5–5.1)
Sodium: 132 mEq/L — ABNORMAL LOW (ref 135–145)
Total Bilirubin: 0.7 mg/dL (ref 0.2–1.2)
Total Protein: 6.8 g/dL (ref 6.0–8.3)

## 2022-09-10 LAB — CBC WITH DIFFERENTIAL/PLATELET
Basophils Absolute: 0 10*3/uL (ref 0.0–0.1)
Basophils Relative: 1 % (ref 0.0–3.0)
Eosinophils Absolute: 0.1 10*3/uL (ref 0.0–0.7)
Eosinophils Relative: 1.1 % (ref 0.0–5.0)
HCT: 41.8 % (ref 36.0–46.0)
Hemoglobin: 14.2 g/dL (ref 12.0–15.0)
Lymphocytes Relative: 18.5 % (ref 12.0–46.0)
Lymphs Abs: 0.9 10*3/uL (ref 0.7–4.0)
MCHC: 33.9 g/dL (ref 30.0–36.0)
MCV: 90.8 fl (ref 78.0–100.0)
Monocytes Absolute: 0.4 10*3/uL (ref 0.1–1.0)
Monocytes Relative: 8 % (ref 3.0–12.0)
Neutro Abs: 3.3 10*3/uL (ref 1.4–7.7)
Neutrophils Relative %: 71.4 % (ref 43.0–77.0)
Platelets: 287 10*3/uL (ref 150.0–400.0)
RBC: 4.6 Mil/uL (ref 3.87–5.11)
RDW: 13.2 % (ref 11.5–15.5)
WBC: 4.6 10*3/uL (ref 4.0–10.5)

## 2022-09-10 LAB — C-REACTIVE PROTEIN: CRP: <1 mg/dL (ref 0.5–20.0)

## 2022-09-10 NOTE — Progress Notes (Signed)
Assessment/Plan:  Total time spent caring for the patient today was 85 minutes. This includes time spent before the visit reviewing the chart, time spent during the visit, and time spent after the visit on documentation, etc.  Problem List Items Addressed This Visit       Digestive   Diverticula of colon     Other   Retroperitoneal abscess - Primary    The patient's history and current atypical presentation warrant further investigation to rule out retroperitoneal abscess.   Differential diagnosis: Retroperitoneal abscess due to past history Somatic symptom disorder Diverticulitis Urinary tract infection  Plan: CBC with differential to check for signs of infection. Comprehensive metabolic panel (CMET) for baseline organ function. CT Abdomen Pelvis with Contrast to visualize any potential abscess. Routine Urinalysis to rule out urinary causes. C-reactive protein for inflammation marker. Discuss findings with the patient and consider appropriate referrals depending on results.      Relevant Orders   CBC w/Diff   Comp Met (CMET)   CT Abdomen Pelvis W Contrast   Urinalysis, Routine w reflex microscopic   C-reactive protein   Anxiety    Given the ongoing management with a therapist and medication, continue to support patient's mental healthcare efforts.  Plan: Continue therapy sessions with a focus on coping mechanisms for health-related anxiety. Monitor Lorazepam effectiveness and assess for potential dependency or need for adjustments. Evaluate the need for psychiatric input if symptoms escalate.      Chronic pain syndrome    Management of chronic pain is complex and requires a multifaceted approach.   Plan: Unable to just stay at visit due to concern associated with potential abscess, can discuss at future visit At future visit, can discuss potential refer to pain management specialty for comprehensive evaluation and management. Consider non-pharmacological  approaches as adjunct therapy. Continue current pain medications as prescribed, re-evaluate effectiveness regularly.      Age-related cataract of both eyes    Monitor eye condition and follow up with ophthalmology for ongoing corneal issues.       There are no discontinued medications.  Return in about 2 weeks (around 09/24/2022) for anxiety .    Subjective:   Encounter date: 09/10/2022  Shannon Montgomery is a 88 y.o. female who has Vitamin D deficiency; Esophagitis; Fibromyalgia; PMR (polymyalgia rheumatica); Primary osteoarthritis of right knee; Retroperitoneal abscess; Chronic nonintractable headache; Anxiety; Chronic pain syndrome; Diverticula of colon; and Age-related cataract of both eyes on their problem list..   She  has a past medical history of Acute non-recurrent frontal sinusitis (07/15/2022), Ankle fracture (06/08/2013), Anxiety, Arthritis, Chronic back pain, Chronic sinusitis, Degenerative joint disease of knee, right, Fibromyalgia, GERD (gastroesophageal reflux disease), Hyponatremia (01/20/2016), Leukocytosis (01/20/2016), Liver lesion, right lobe (01/20/2016), Open right ankle fracture (06/08/2013), Osteoarthritis, Pleural effusion on right (01/20/2016), PMR (polymyalgia rheumatica), and Wears glasses..   CHIEF COMPLAINT: Patient presents with concerns about a possible internal abdominal abscess, chronic pain, and anxiety.  HISTORY OF PRESENT ILLNESS:  Abdominal Abscess: Patient has a history of perihepatic abscess requiring drainage and hospitalization twice in the past. She is currently experiencing a feeling that "something is not right" in her abdomen, similar to her previous experiences with abscess but without pain. No recent fever, but reports subjective chills.   Anxiety: Patient has an ongoing concern of anxiety, managed with Lorazepam and therapy.  Spouse indicates this may be exacerbating her concern over the abdominal symptoms, though the patient insists it is a  combination of both feelings and physical  sensations.  Chronic Pain: Reports chronic back and joint pain, which is systemic and constant. Patient is dissatisfied with her current level of pain management.  Review of Systems  Constitutional:  Positive for chills.  Eyes:  Positive for blurred vision.  Gastrointestinal:  Positive for abdominal pain. Negative for blood in stool, constipation, diarrhea, melena, nausea and vomiting.  Musculoskeletal:  Positive for back pain and joint pain.  All other systems reviewed and are negative.   Past Surgical History:  Procedure Laterality Date   ABSCESS DRAINAGE Right 12/2015 - 01/2016 X 2   "inside abdomen"   ANKLE HARDWARE REMOVAL Right 04/2014   "partial hardware removed"   CATARACT EXTRACTION W/ INTRAOCULAR LENS  IMPLANT, BILATERAL Bilateral    CORNEAL TRANSPLANT Bilateral 2005-2007   "twice on left side"   EYE SURGERY     FRACTURE SURGERY     I & D EXTREMITY Right 06/08/2013   Procedure: IRRIGATION AND DEBRIDEMENT EXTREMITY/RIGHT ANKLE;  Surgeon: Mable Paris, MD;  Location: [facility] OR;  Service: Orthopedics;  Laterality: Right;   IR GENERIC HISTORICAL  02/07/2016   IR RADIOLOGIST EVAL & MGMT 02/07/2016 Darrell K Allred, PA-C [facility] INTERV RAD   IR GENERIC HISTORICAL  02/21/2016   IR RADIOLOGIST EVAL & MGMT 02/21/2016 Barnetta Chapel, PA-C [facility] INTERV RAD   IR GUIDED DRAIN W CATHETER PLACEMENT  10/08/2020   IR RADIOLOGIST EVAL & MGMT  10/24/2020   JOINT REPLACEMENT     ORIF ANKLE FRACTURE Right 06/08/2013   Procedure: OPEN REDUCTION INTERNAL FIXATION (ORIF) ANKLE FRACTURE;  Surgeon: Mable Paris, MD;  Location: [facility] OR;  Service: Orthopedics;  Laterality: Right;   TOTAL KNEE ARTHROPLASTY Right 02/17/2017   TOTAL KNEE ARTHROPLASTY Right 02/17/2017   Procedure: TOTAL KNEE ARTHROPLASTY;  Surgeon: Marcene Corning, MD;  Location: [facility] OR;  Service: Orthopedics;  Laterality: Right;    Outpatient Medications Prior to Visit  Medication Sig  Dispense Refill   acetaminophen (TYLENOL 8 HOUR) 650 MG CR tablet Take 1 tablet (650 mg total) by mouth every 8 (eight) hours as needed for pain.     benzonatate (TESSALON) 100 MG capsule Take 1 capsule (100 mg total) by mouth 2 (two) times daily as needed for cough. 20 capsule 0   LORazepam (ATIVAN) 0.5 MG tablet Take 0.5 tablets (0.25 mg total) by mouth 2 (two) times daily as needed for anxiety. 30 tablet 0   Multiple Vitamins-Minerals (CENTRUM ADULT PO) Take by mouth.     prednisoLONE acetate (PRED FORTE) 1 % ophthalmic suspension SMARTSIG:In Eye(s)     fluticasone (FLONASE) 50 MCG/ACT nasal spray Place 2 sprays into both nostrils daily. (Patient not taking: Reported on 09/10/2022) 48 g 6   traMADol (ULTRAM) 50 MG tablet Take 50-100 mg by mouth every 6 (six) hours as needed. (Patient not taking: Reported on 09/10/2022)     No facility-administered medications prior to visit.    Family History  Problem Relation Age of Onset   Breast cancer Mother    Anuerysm Father    Diabetes Brother    Brain cancer Brother    Alcoholism Brother     Social History   Socioeconomic History   Marital status: Married    Spouse name: Not on file   Number of children: Not on file   Years of education: Not on file   Highest education level: Not on file  Occupational History   Not on file  Tobacco Use   Smoking status: Never    Passive exposure:  Never   Smokeless tobacco: Never  Vaping Use   Vaping Use: Never used  Substance and Sexual Activity   Alcohol use: No   Drug use: No   Sexual activity: Never    Birth control/protection: None  Other Topics Concern   Not on file  Social History Narrative   Not on file   Social Determinants of Health   Financial Resource Strain: Not on file  Food Insecurity: Not on file  Transportation Needs: Not on file  Physical Activity: Not on file  Stress: Not on file  Social Connections: Not on file  Intimate Partner Violence: Not on file                                                                                                   Objective:  Physical Exam: BP 114/70 (BP Location: Left Arm, Patient Position: Sitting, Cuff Size: Large)   Pulse 79   Temp 97.8 F (36.6 C) (Temporal)   Wt 135 lb (61.2 kg)   SpO2 99%   BMI 25.51 kg/m     Physical Exam Constitutional:      General: She is not in acute distress.    Appearance: Normal appearance. She is not ill-appearing or toxic-appearing.  HENT:     Head: Normocephalic and atraumatic.     Nose: Nose normal. No congestion.  Eyes:     General: No scleral icterus.    Extraocular Movements: Extraocular movements intact.  Cardiovascular:     Rate and Rhythm: Normal rate and regular rhythm.     Pulses: Normal pulses.     Heart sounds: Normal heart sounds.  Pulmonary:     Effort: Pulmonary effort is normal. No respiratory distress.     Breath sounds: Normal breath sounds.  Abdominal:     General: Abdomen is flat. Bowel sounds are normal. There is no distension.     Palpations: Abdomen is soft.     Tenderness: There is no abdominal tenderness. There is no guarding or rebound.  Musculoskeletal:        General: Normal range of motion.  Lymphadenopathy:     Cervical: No cervical adenopathy.  Skin:    General: Skin is warm and dry.     Findings: No rash.  Neurological:     General: No focal deficit present.     Mental Status: She is alert and oriented to person, place, and time. Mental status is at baseline.  Psychiatric:        Mood and Affect: Mood is anxious.        Behavior: Behavior normal.        Thought Content: Thought content normal.        Judgment: Judgment normal.     No results found.  No results found for this or any previous visit (from the past 2160 hour(s)).      Garner Nash, MD, MS

## 2022-09-10 NOTE — Assessment & Plan Note (Signed)
Monitor eye condition and follow up with ophthalmology for ongoing corneal issues.

## 2022-09-10 NOTE — Assessment & Plan Note (Signed)
Given the ongoing management with a therapist and medication, continue to support patient's mental healthcare efforts.  Plan: Continue therapy sessions with a focus on coping mechanisms for health-related anxiety. Monitor Lorazepam effectiveness and assess for potential dependency or need for adjustments. Evaluate the need for psychiatric input if symptoms escalate.

## 2022-09-10 NOTE — Patient Instructions (Signed)
For concern about abdominal abscess, we are ordering a stat CT abdomen pelvis along with stat blood work.  The office will call to help schedule the CT scan.  Please go to the emergency department if you develop severe abdominal pain, nausea, vomiting, fevers or any other worrisome symptom

## 2022-09-10 NOTE — Assessment & Plan Note (Signed)
The patient's history and current atypical presentation warrant further investigation to rule out retroperitoneal abscess.   Differential diagnosis: Retroperitoneal abscess due to past history Somatic symptom disorder Diverticulitis Urinary tract infection  Plan: CBC with differential to check for signs of infection. Comprehensive metabolic panel (CMET) for baseline organ function. CT Abdomen Pelvis with Contrast to visualize any potential abscess. Routine Urinalysis to rule out urinary causes. C-reactive protein for inflammation marker. Discuss findings with the patient and consider appropriate referrals depending on results.

## 2022-09-10 NOTE — Assessment & Plan Note (Signed)
Management of chronic pain is complex and requires a multifaceted approach.   Plan: Unable to just stay at visit due to concern associated with potential abscess, can discuss at future visit At future visit, can discuss potential refer to pain management specialty for comprehensive evaluation and management. Consider non-pharmacological approaches as adjunct therapy. Continue current pain medications as prescribed, re-evaluate effectiveness regularly.

## 2022-09-11 ENCOUNTER — Telehealth: Payer: Self-pay

## 2022-09-11 NOTE — Telephone Encounter (Signed)
Spoke with Shannon Montgomery and she stated that Declined her appt for her STAT on yesterday. The patient's stated that they didn't know they had to go all the way to [place]. I contacted pt and she stated she was ok with [facility] she just couldn't go at the time they asked. They offered her a 7pm visit and she can't drive in the dark. She was asking if they had anything earlier in the day? She stated she isn't able to come in this week it would have to be Saturday or next week. Please advise.

## 2022-09-13 ENCOUNTER — Ambulatory Visit (HOSPITAL_BASED_OUTPATIENT_CLINIC_OR_DEPARTMENT_OTHER)
Admission: RE | Admit: 2022-09-13 | Discharge: 2022-09-13 | Disposition: A | Discharge status: Home / Self Care | Payer: Medicare Other | Source: Ambulatory Visit | Attending: Family Medicine | Admitting: Family Medicine

## 2022-09-13 ENCOUNTER — Encounter (HOSPITAL_BASED_OUTPATIENT_CLINIC_OR_DEPARTMENT_OTHER): Payer: Self-pay

## 2022-09-13 DIAGNOSIS — K6819 Other retroperitoneal abscess: Secondary | ICD-10-CM | POA: Diagnosis present

## 2022-09-13 HISTORY — DX: Unspecified asthma, uncomplicated: J45.909

## 2022-09-13 MED ORDER — IOHEXOL 300 MG/ML  SOLN
80.0000 mL | Freq: Once | INTRAMUSCULAR | Status: AC | PRN
  Administered 2022-09-13: 70 mL via INTRAVENOUS

## 2022-09-15 ENCOUNTER — Telehealth: Payer: Self-pay

## 2022-09-15 MED ORDER — CALCITONIN (SALMON) 200 UNIT/ACT NA SOLN: 1.0000 | Freq: Every day | NASAL | 12 refills | Status: DC

## 2022-09-15 NOTE — Addendum Note (Signed)
Addended by: Fanny Bien B on: 09/15/2022 01:04 PM   Modules accepted: Orders

## 2022-09-15 NOTE — Telephone Encounter (Signed)
Pt was unable to leave urine sample last Wednesday 09/10/22. Pt returning tomorrow, Tuesday, 09/16/22 to leave a urine sample.

## 2022-09-16 NOTE — Addendum Note (Signed)
Addended by: Lake Bells on: 09/16/2022 03:49 PM   Modules accepted: Orders

## 2022-09-17 LAB — URINALYSIS, ROUTINE W REFLEX MICROSCOPIC
Bilirubin Urine: NEGATIVE
Hgb urine dipstick: NEGATIVE
Ketones, ur: NEGATIVE
Leukocytes,Ua: NEGATIVE
Nitrite: NEGATIVE
RBC / HPF: NONE SEEN (ref 0–?)
Specific Gravity, Urine: 1.01 (ref 1.000–1.030)
Total Protein, Urine: NEGATIVE
Urine Glucose: NEGATIVE
Urobilinogen, UA: 0.2 (ref 0.0–1.0)
WBC, UA: NONE SEEN (ref 0–?)
pH: 7 (ref 5.0–8.0)

## 2022-09-25 ENCOUNTER — Telehealth: Payer: Self-pay | Admitting: Family Medicine

## 2022-09-25 NOTE — Telephone Encounter (Signed)
Called patient to schedule Medicare Annual Wellness Visit (AWV). Left message for patient to call back and schedule Medicare Annual Wellness Visit (AWV).  Last date of AWV: awvi 05/26/09 per [facility]   Please schedule an appointment at any time with [facility]  If any questions, please contact me at [phone].  Thank you ,  Rudell Cobb AWV direct phone # [phone]   Spoke w/patient she stated she was busy right now and would call back to schedule

## 2022-09-29 ENCOUNTER — Ambulatory Visit: Payer: Medicare Other | Admitting: Family Medicine

## 2022-10-28 ENCOUNTER — Other Ambulatory Visit: Payer: Self-pay | Admitting: Family Medicine

## 2022-10-28 DIAGNOSIS — F419 Anxiety disorder, unspecified: Secondary | ICD-10-CM

## 2022-11-17 ENCOUNTER — Ambulatory Visit (INDEPENDENT_AMBULATORY_CARE_PROVIDER_SITE_OTHER): Payer: Medicare Other | Admitting: Family Medicine

## 2022-11-17 ENCOUNTER — Encounter: Payer: Self-pay | Admitting: Family Medicine

## 2022-11-17 VITALS — BP 132/78 | HR 77 | Temp 97.7°F | Wt 128.6 lb

## 2022-11-17 DIAGNOSIS — G894 Chronic pain syndrome: Secondary | ICD-10-CM | POA: Diagnosis not present

## 2022-11-17 DIAGNOSIS — H6121 Impacted cerumen, right ear: Secondary | ICD-10-CM

## 2022-11-17 DIAGNOSIS — R519 Headache, unspecified: Secondary | ICD-10-CM | POA: Diagnosis not present

## 2022-11-17 DIAGNOSIS — M353 Polymyalgia rheumatica: Secondary | ICD-10-CM | POA: Diagnosis not present

## 2022-11-17 DIAGNOSIS — G8929 Other chronic pain: Secondary | ICD-10-CM

## 2022-11-17 MED ORDER — MELOXICAM 7.5 MG PO TABS: 7.5000 mg | ORAL_TABLET | Freq: Every day | ORAL | 0 refills | Status: AC

## 2022-11-17 MED ORDER — DEBROX 6.5 % OT SOLN: OTIC | 0 refills | Status: DC

## 2022-11-17 MED ORDER — DEBROX 6.5 % OT SOLN: OTIC | 0 refills | Status: AC

## 2022-11-17 MED ORDER — FAMOTIDINE 40 MG PO TABS: 40.0000 mg | ORAL_TABLET | Freq: Every day | ORAL | 0 refills | Status: DC

## 2022-11-17 NOTE — Progress Notes (Signed)
Assessment/Plan:   Problem List Items Addressed This Visit       Nervous and Auditory   Hearing loss of right ear due to cerumen impaction    Re-occurrence of cerumen impaction causing hearing difficulty.  Plan: Status post debridement  As needed Debrox drops to soften earwax.   Follow-up as needed      Relevant Medications   carbamide peroxide (DEBROX) 6.5 % OTIC solution     Other   PMR (polymyalgia rheumatica) (HCC)   Relevant Orders   AMB Referral to Chronic Care Management Services   Chronic nonintractable headache   Relevant Medications   meloxicam (MOBIC) 7.5 MG tablet   Other Relevant Orders   AMB Referral to Chronic Care Management Services   Chronic pain syndrome - Primary    Summary: Chronic back pain unresponsive to multiple treatments, impacting daily life and function.  Plan: Start meloxicam 7.5mg  once daily with food for anti-inflammatory effects. Concurrently prescribe Famotidine 40mg  daily to prevent gastrointestinal side effects. Continue with Tylenol 650mg  every 8 hours as needed for additional pain relief. Referral to chronic care management services for further pain management. Recommend patient follow-up with her multimodality team with physical therapy and pain management providers as needed      Relevant Medications   meloxicam (MOBIC) 7.5 MG tablet   famotidine (PEPCID) 40 MG tablet   Other Relevant Orders   AMB Referral to Chronic Care Management Services    Medications Discontinued During This Encounter  Medication Reason   famotidine (PEPCID) 40 MG tablet Reorder   traMADol (ULTRAM) 50 MG tablet    calcitonin, salmon, (MIACALCIN) 200 UNIT/ACT nasal spray    benzonatate (TESSALON) 100 MG capsule    fluticasone (FLONASE) 50 MCG/ACT nasal spray    carbamide peroxide (DEBROX) 6.5 % OTIC solution Reorder    Return in about 3 months (around 02/17/2023), or if symptoms worsen or fail to improve, for Pain.    Subjective:   Encounter  date: 11/17/2022  Shannon Montgomery is a 88 y.o. female who has Vitamin D deficiency; Esophagitis; Fibromyalgia; PMR (polymyalgia rheumatica) (HCC); Primary osteoarthritis of right knee; Retroperitoneal abscess (HCC); Chronic nonintractable headache; Anxiety; Chronic pain syndrome; Diverticula of colon; Age-related cataract of both eyes; and Hearing loss of right ear due to cerumen impaction on their problem list..   She  has a past medical history of Acute non-recurrent frontal sinusitis (07/15/2022), Ankle fracture (06/08/2013), Anxiety, Arthritis, Asthma, Chronic back pain, Chronic sinusitis, Degenerative joint disease of knee, right, Fibromyalgia, GERD (gastroesophageal reflux disease), Hyponatremia (01/20/2016), Leukocytosis (01/20/2016), Liver lesion, right lobe (01/20/2016), Open right ankle fracture (06/08/2013), Osteoarthritis, Pleural effusion on right (01/20/2016), PMR (polymyalgia rheumatica) (HCC), and Wears glasses..   Chief Complaint: Chronic back pain and right ear blockage.  History of Present Illness:  Chronic Back Pain: Patient presents with chronic back pain that has not been alleviated by multiple treatments including Tylenol, Tramadol, duloxetine, gabapentin, and Lyrica, as well as various physical therapies. Consultations with orthopedics, pain management, and a physiatrist have not provided significant relief. Notably, the patient experienced intolerance to different pain medications. Current pain management strategies remain insufficient, limiting the patient's activity and appetite. Despite comprehensive medical consultations and treatment regimens, the patient's pain has significantly worsened, affecting daily functions and mood.  Ear Blockage: The patient reports occurrences of right ear blockage due to cerumen impaction, leading to hearing difficulties and discomfort. Previous mechanical removal was painful for the patient.   Cerumen Impaction.  Auditory canal(s) of the  left ear are completely obstructed with cerumen. Patient counseled on removal of cerumen. Patient consented. Cerumen was removed using gentle irrigation and soft plastic curettes. Tympanic membranes are intact following the procedure.  Auditory canals are normal.  Patient tolerated procedure well.   Review of Systems  Constitutional:  Positive for malaise/fatigue and weight loss.  HENT:  Positive for ear pain (Right ear) and hearing loss.   Gastrointestinal:  Positive for heartburn.  Musculoskeletal:  Positive for back pain.  Psychiatric/Behavioral:  Positive for depression. The patient is nervous/anxious.     Past Surgical History:  Procedure Laterality Date   ABSCESS DRAINAGE Right 12/2015 - 01/2016 X 2   "inside abdomen"   ANKLE HARDWARE REMOVAL Right 04/2014   "partial hardware removed"   CATARACT EXTRACTION W/ INTRAOCULAR LENS  IMPLANT, BILATERAL Bilateral    CORNEAL TRANSPLANT Bilateral 2005-2007   "twice on left side"   EYE SURGERY     FRACTURE SURGERY     I & D EXTREMITY Right 06/08/2013   Procedure: IRRIGATION AND DEBRIDEMENT EXTREMITY/RIGHT ANKLE;  Surgeon: Mable Paris, MD;  Location: [facility] OR;  Service: Orthopedics;  Laterality: Right;   IR GENERIC HISTORICAL  02/07/2016   IR RADIOLOGIST EVAL & MGMT 02/07/2016 Darrell K Allred, PA-C [facility] INTERV RAD   IR GENERIC HISTORICAL  02/21/2016   IR RADIOLOGIST EVAL & MGMT 02/21/2016 Barnetta Chapel, PA-C [facility] INTERV RAD   IR GUIDED DRAIN W CATHETER PLACEMENT  10/08/2020   IR RADIOLOGIST EVAL & MGMT  10/24/2020   JOINT REPLACEMENT     ORIF ANKLE FRACTURE Right 06/08/2013   Procedure: OPEN REDUCTION INTERNAL FIXATION (ORIF) ANKLE FRACTURE;  Surgeon: Mable Paris, MD;  Location: [facility] OR;  Service: Orthopedics;  Laterality: Right;   TOTAL KNEE ARTHROPLASTY Right 02/17/2017   TOTAL KNEE ARTHROPLASTY Right 02/17/2017   Procedure: TOTAL KNEE ARTHROPLASTY;  Surgeon: Marcene Corning, MD;  Location: [facility] OR;  Service: Orthopedics;   Laterality: Right;    Outpatient Medications Prior to Visit  Medication Sig Dispense Refill   acetaminophen (TYLENOL 8 HOUR) 650 MG CR tablet Take 1 tablet (650 mg total) by mouth every 8 (eight) hours as needed for pain.     LORazepam (ATIVAN) 0.5 MG tablet Take 0.5 tablets (0.25 mg total) by mouth 2 (two) times daily as needed for anxiety or sleep. 30 tablet 2   prednisoLONE acetate (PRED FORTE) 1 % ophthalmic suspension SMARTSIG:In Eye(s)     Multiple Vitamins-Minerals (CENTRUM ADULT PO) Take by mouth. (Patient not taking: Reported on 11/17/2022)     benzonatate (TESSALON) 100 MG capsule Take 1 capsule (100 mg total) by mouth 2 (two) times daily as needed for cough. (Patient not taking: Reported on 11/17/2022) 20 capsule 0   calcitonin, salmon, (MIACALCIN) 200 UNIT/ACT nasal spray Place 1 spray into alternate nostrils daily. 3.7 mL 12   fluticasone (FLONASE) 50 MCG/ACT nasal spray Place 2 sprays into both nostrils daily. (Patient not taking: Reported on 09/10/2022) 48 g 6   traMADol (ULTRAM) 50 MG tablet Take 50-100 mg by mouth every 6 (six) hours as needed. (Patient not taking: Reported on 09/10/2022)     No facility-administered medications prior to visit.    Family History  Problem Relation Age of Onset   Breast cancer Mother    Anuerysm Father    Diabetes Brother    Brain cancer Brother    Alcoholism Brother     Social History   Socioeconomic History   Marital status: Married    Spouse  name: Not on file   Number of children: Not on file   Years of education: Not on file   Highest education level: Not on file  Occupational History   Not on file  Tobacco Use   Smoking status: Never    Passive exposure: Never   Smokeless tobacco: Never  Vaping Use   Vaping Use: Never used  Substance and Sexual Activity   Alcohol use: No   Drug use: No   Sexual activity: Never    Birth control/protection: None  Other Topics Concern   Not on file  Social History Narrative   Not on file    Social Determinants of Health   Financial Resource Strain: Not on file  Food Insecurity: Not on file  Transportation Needs: Not on file  Physical Activity: Not on file  Stress: Not on file  Social Connections: Not on file  Intimate Partner Violence: Not on file                                                                                                  Objective:  Physical Exam: BP 132/78 (BP Location: Left Arm, Patient Position: Sitting, Cuff Size: Large)   Pulse 77   Temp 97.7 F (36.5 C) (Temporal)   Wt 128 lb 9.6 oz (58.3 kg)   SpO2 100%   BMI 24.30 kg/m   Wt Readings from Last 3 Encounters:  11/17/22 128 lb 9.6 oz (58.3 kg)  09/10/22 135 lb (61.2 kg)  07/15/22 133 lb 9.6 oz (60.6 kg)      Physical Exam Constitutional:      General: She is not in acute distress.    Appearance: Normal appearance. She is not ill-appearing or toxic-appearing.  HENT:     Head: Normocephalic and atraumatic.     Nose: Nose normal. No congestion.  Eyes:     General: No scleral icterus.    Extraocular Movements: Extraocular movements intact.  Cardiovascular:     Rate and Rhythm: Normal rate and regular rhythm.     Pulses: Normal pulses.     Heart sounds: Normal heart sounds.  Pulmonary:     Effort: Pulmonary effort is normal. No respiratory distress.     Breath sounds: Normal breath sounds.  Abdominal:     General: Abdomen is flat. Bowel sounds are normal. There is no distension.     Palpations: Abdomen is soft.     Tenderness: There is no abdominal tenderness. There is no guarding or rebound.  Musculoskeletal:        General: Normal range of motion.     Comments: Extensive kyphosis  Lymphadenopathy:     Cervical: No cervical adenopathy.  Skin:    General: Skin is warm and dry.     Findings: No rash.  Neurological:     General: No focal deficit present.     Mental Status: She is alert and oriented to person, place, and time. Mental status is at baseline.     Gait:  Gait abnormal (Uses walker).  Psychiatric:        Mood and Affect: Mood  is anxious.        Behavior: Behavior normal.        Thought Content: Thought content normal.        Judgment: Judgment normal.     CT Abdomen Pelvis W Contrast  Result Date: 09/13/2022 CLINICAL DATA:  Abdominal pain, acute, nonlocalized EXAM: CT ABDOMEN AND PELVIS WITH CONTRAST TECHNIQUE: Multidetector CT imaging of the abdomen and pelvis was performed using the standard protocol following bolus administration of intravenous contrast. RADIATION DOSE REDUCTION: This exam was performed according to the departmental dose-optimization program which includes automated exposure control, adjustment of the mA and/or kV according to patient size and/or use of iterative reconstruction technique. CONTRAST:  70 mL OMNIPAQUE IOHEXOL 300 MG/ML  SOLN COMPARISON:  10/24/2020 FINDINGS: Lower chest: No acute abnormality. Hepatobiliary: No focal liver lesion is identified. Numerous stones within the gallbladder. Gallbladder wall appears to be mildly thickened. No biliary dilatation. Pancreas: Unremarkable. No pancreatic ductal dilatation or surrounding inflammatory changes. Spleen: Normal in size without focal abnormality. Adrenals/Urinary Tract: Unremarkable adrenal glands. Kidneys enhance symmetrically without focal lesion, stone, or hydronephrosis. Ureters are nondilated. Urinary bladder appears unremarkable for the degree of distention. Stomach/Bowel: Stomach within normal limits. Enteric contrast within the small bowel. No abnormally dilated loops of bowel. Extensive colonic diverticulosis. No focally inflamed diverticulum is seen. No focal bowel wall thickening or inflammatory changes. Vascular/Lymphatic: Aortic atherosclerosis. No enlarged abdominal or pelvic lymph nodes. Reproductive: 2.8 cm simple left adnexal cyst. No follow-up imaging recommended. Note: This recommendation does not apply to premenarchal patients and to those with increased  risk (genetic, family history, elevated tumor markers or other high-risk factors) of ovarian cancer. Reference: JACR 2020 Feb; 17(2):248-254 Stable coarse calcifications in the left adnexa. Mildly atrophic uterus. Other: Small 1.7 cm fluid attenuation structure posterior to the right hepatic lobe at the site of prior percutaneous drainage catheter which may represent a small residual seroma (series 2, image 12). No free fluid. No new fluid collections. No pneumoperitoneum. Musculoskeletal: Bones are demineralized. Inferior endplate compression fracture at L3 appears to be subacute. Superior and inferior endplate compression fracture at L4 appears acute or subacute. Chronic L1 inferior endplate compression fracture. IMPRESSION: 1. Cholelithiasis with suggestion of mild gallbladder wall thickening. Correlate with right upper quadrant pain and consider ultrasound. 2. New compression fractures of the L3 and L4 vertebral bodies which appear acute or subacute. Correlate with point tenderness. 3. Extensive colonic diverticulosis without evidence of acute diverticulitis. 4. Small 1.7 cm fluid attenuation structure posterior to the right hepatic lobe at the site of prior percutaneous drainage catheter which may represent a small residual seroma. 5. Aortic atherosclerosis (ICD10-I70.0). Electronically Signed   By: Duanne Guess D.O.   On: 09/13/2022 12:34    Recent Results (from the past 2160 hour(s))  CBC w/Diff     Status: None   Collection Time: 09/10/22 11:24 AM  Result Value Ref Range   WBC 4.6 4.0 - 10.5 K/uL   RBC 4.60 3.87 - 5.11 Mil/uL   Hemoglobin 14.2 12.0 - 15.0 g/dL   HCT 56.4 33.2 - 95.1 %   MCV 90.8 78.0 - 100.0 fl   MCHC 33.9 30.0 - 36.0 g/dL   RDW 88.4 16.6 - 06.3 %   Platelets 287.0 150.0 - 400.0 K/uL   Neutrophils Relative % 71.4 43.0 - 77.0 %   Lymphocytes Relative 18.5 12.0 - 46.0 %   Monocytes Relative 8.0 3.0 - 12.0 %   Eosinophils Relative 1.1 0.0 - 5.0 %  Basophils Relative 1.0  0.0 - 3.0 %   Neutro Abs 3.3 1.4 - 7.7 K/uL   Lymphs Abs 0.9 0.7 - 4.0 K/uL   Monocytes Absolute 0.4 0.1 - 1.0 K/uL   Eosinophils Absolute 0.1 0.0 - 0.7 K/uL   Basophils Absolute 0.0 0.0 - 0.1 K/uL  Comp Met (CMET)     Status: Abnormal   Collection Time: 09/10/22 11:24 AM  Result Value Ref Range   Sodium 132 (L) 135 - 145 mEq/L   Potassium 3.8 3.5 - 5.1 mEq/L   Chloride 91 (L) 96 - 112 mEq/L   CO2 32 19 - 32 mEq/L   Glucose, Bld 83 70 - 99 mg/dL   BUN 15 6 - 23 mg/dL   Creatinine, Ser 9.60 0.40 - 1.20 mg/dL   Total Bilirubin 0.7 0.2 - 1.2 mg/dL   Alkaline Phosphatase 65 39 - 117 U/L   AST 18 0 - 37 U/L   ALT 8 0 - 35 U/L   Total Protein 6.8 6.0 - 8.3 g/dL   Albumin 4.1 3.5 - 5.2 g/dL   GFR 45.40 >98.11 mL/min    Comment: Calculated using the CKD-EPI Creatinine Equation (2021)   Calcium 9.4 8.4 - 10.5 mg/dL  C-reactive protein     Status: None   Collection Time: 09/10/22 11:24 AM  Result Value Ref Range   CRP <1.0 0.5 - 20.0 mg/dL  Urinalysis, Routine w reflex microscopic     Status: None   Collection Time: 09/16/22  3:49 PM  Result Value Ref Range   Color, Urine YELLOW Yellow;Lt. Yellow;Straw;Dark Yellow;Amber;Green;Red;Brown   APPearance CLEAR Clear;Turbid;Slightly Cloudy;Cloudy   Specific Gravity, Urine 1.010 1.000 - 1.030   pH 7.0 5.0 - 8.0   Total Protein, Urine NEGATIVE Negative   Urine Glucose NEGATIVE Negative   Ketones, ur NEGATIVE Negative   Bilirubin Urine NEGATIVE Negative   Hgb urine dipstick NEGATIVE Negative   Urobilinogen, UA 0.2 0.0 - 1.0   Leukocytes,Ua NEGATIVE Negative   Nitrite NEGATIVE Negative   WBC, UA none seen 0-2/hpf   RBC / HPF none seen 0-2/hpf   Squamous Epithelial / HPF Rare(0-4/hpf) Rare(0-4/hpf)        Garner Nash, MD, MS

## 2022-11-17 NOTE — Patient Instructions (Signed)
For back pain: Start meloxicam 7.5 mg along with famotidine 40 mg once a day as needed for pain. For earwax: Continue to use Debrox drops as needed.

## 2022-11-17 NOTE — Assessment & Plan Note (Addendum)
Summary: Chronic back pain unresponsive to multiple treatments, impacting daily life and function.  Plan: Start meloxicam 7.5mg  once daily with food for anti-inflammatory effects. Concurrently prescribe Famotidine 40mg  daily to prevent gastrointestinal side effects. Continue with Tylenol 650mg  every 8 hours as needed for additional pain relief. Referral to chronic care management services for further pain management. Recommend patient follow-up with her multimodality team with physical therapy and pain management providers as needed

## 2022-11-17 NOTE — Assessment & Plan Note (Signed)
Re-occurrence of cerumen impaction causing hearing difficulty.  Plan: Status post debridement  As needed Debrox drops to soften earwax.   Follow-up as needed

## 2022-11-18 ENCOUNTER — Telehealth: Payer: Self-pay | Admitting: *Deleted

## 2022-11-18 NOTE — Progress Notes (Signed)
  Care Coordination   Note   11/18/2022 Name: RUTHEL MARTINE MRN: [id number] DOB: 1934-12-04  KEI LANGHORST is a 88 y.o. year old female who sees Garnette Gunner, MD for primary care. I reached out to Gypsy Lore by phone today to offer care coordination services.  Ms. Freeman was given information about Care Coordination services today including:   The Care Coordination services include support from the care team which includes your Nurse Coordinator, Clinical Social Worker, or Pharmacist.  The Care Coordination team is here to help remove barriers to the health concerns and goals most important to you. Care Coordination services are voluntary, and the patient may decline or stop services at any time by request to their care team member.   Care Coordination Consent Status: Patient did not agree to participate in care coordination services at this time.  Encounter Outcome:  Pt. Refused [facility] Coordination Care Guide  Direct Dial: [phone]

## 2022-11-24 ENCOUNTER — Telehealth: Payer: Self-pay | Admitting: Family Medicine

## 2022-11-24 NOTE — Telephone Encounter (Signed)
Pt's husband Mathis Fare called in stating his wife is still having back pain. She is taking meloxicam (MOBIC) 7.5 MG tablet [[id number]] and feels the 7.5mg  is not helping. Please advise pt or Mathis Fare at [phone].

## 2022-11-24 NOTE — Telephone Encounter (Signed)
Spoke with pt and albert regarding annotation below and they verbalized understanding.

## 2022-12-12 ENCOUNTER — Telehealth: Payer: Self-pay

## 2022-12-12 ENCOUNTER — Ambulatory Visit (INDEPENDENT_AMBULATORY_CARE_PROVIDER_SITE_OTHER): Payer: Medicare Other

## 2022-12-12 VITALS — Ht 61.0 in | Wt 136.0 lb

## 2022-12-12 DIAGNOSIS — Z0000 Encounter for general adult medical examination without abnormal findings: Secondary | ICD-10-CM

## 2022-12-12 NOTE — Patient Instructions (Signed)
Shannon Montgomery , Thank you for taking time to come for your Medicare Wellness Visit. I appreciate your ongoing commitment to your health goals. Please review the following plan we discussed and let me know if I can assist you in the future.   These are the goals we discussed:  Goals      CCM Expected Outcome:  Monitor, Self-Manage and Reduce Symptoms of:     Patient Stated     12/12/2022, denies any goals at this time        This is a list of the screening recommended for you and due dates:  Health Maintenance  Topic Date Due   Zoster (Shingles) Vaccine (1 of 2) Never done   DEXA scan (bone density measurement)  Never done   COVID-19 Vaccine (3 - 2023-24 season) 01/24/2022   Flu Shot  12/25/2022   DTaP/Tdap/Td vaccine (3 - Td or Tdap) 06/09/2023   Medicare Annual Wellness Visit  12/12/2023   Pneumonia Vaccine  Completed   HPV Vaccine  Aged Out    Advanced directives: Please bring a copy of your POA (Power of [place]) and/or Living Will to your next appointment.   Conditions/risks identified: none  Next appointment: Follow up in one year for your annual wellness visit    Preventive Care 65 Years and Older, Female Preventive care refers to lifestyle choices and visits with your health care provider that can promote health and wellness. What does preventive care include? A yearly physical exam. This is also called an annual well check. Dental exams once or twice a year. Routine eye exams. Ask your health care provider how often you should have your eyes checked. Personal lifestyle choices, including: Daily care of your teeth and gums. Regular physical activity. Eating a healthy diet. Avoiding tobacco and drug use. Limiting alcohol use. Practicing safe sex. Taking low-dose aspirin every day. Taking vitamin and mineral supplements as recommended by your health care provider. What happens during an annual well check? The services and screenings done by your health care  provider during your annual well check will depend on your age, overall health, lifestyle risk factors, and family history of disease. Counseling  Your health care provider may ask you questions about your: Alcohol use. Tobacco use. Drug use. Emotional well-being. Home and relationship well-being. Sexual activity. Eating habits. History of falls. Memory and ability to understand (cognition). Work and work Astronomer. Reproductive health. Screening  You may have the following tests or measurements: Height, weight, and BMI. Blood pressure. Lipid and cholesterol levels. These may be checked every 5 years, or more frequently if you are over 22 years old. Skin check. Lung cancer screening. You may have this screening every year starting at age 82 if you have a 30-pack-year history of smoking and currently smoke or have quit within the past 15 years. Fecal occult blood test (FOBT) of the stool. You may have this test every year starting at age 33. Flexible sigmoidoscopy or colonoscopy. You may have a sigmoidoscopy every 5 years or a colonoscopy every 10 years starting at age 62. Hepatitis C blood test. Hepatitis B blood test. Sexually transmitted disease (STD) testing. Diabetes screening. This is done by checking your blood sugar (glucose) after you have not eaten for a while (fasting). You may have this done every 1-3 years. Bone density scan. This is done to screen for osteoporosis. You may have this done starting at age 91. Mammogram. This may be done every 1-2 years. Talk to your health care  provider about how often you should have regular mammograms. Talk with your health care provider about your test results, treatment options, and if necessary, the need for more tests. Vaccines  Your health care provider may recommend certain vaccines, such as: Influenza vaccine. This is recommended every year. Tetanus, diphtheria, and acellular pertussis (Tdap, Td) vaccine. You may need a Td  booster every 10 years. Zoster vaccine. You may need this after age 33. Pneumococcal 13-valent conjugate (PCV13) vaccine. One dose is recommended after age 5. Pneumococcal polysaccharide (PPSV23) vaccine. One dose is recommended after age 19. Talk to your health care provider about which screenings and vaccines you need and how often you need them. This information is not intended to replace advice given to you by your health care provider. Make sure you discuss any questions you have with your health care provider. Document Released: 06/08/2015 Document Revised: 01/30/2016 Document Reviewed: 03/13/2015 Elsevier Interactive Patient Education  2017 [employer].  Fall Prevention in the Home Falls can cause injuries. They can happen to people of all ages. There are many things you can do to make your home safe and to help prevent falls. What can I do on the outside of my home? Regularly fix the edges of walkways and driveways and fix any cracks. Remove anything that might make you trip as you walk through a door, such as a raised step or threshold. Trim any bushes or trees on the path to your home. Use bright outdoor lighting. Clear any walking paths of anything that might make someone trip, such as rocks or tools. Regularly check to see if handrails are loose or broken. Make sure that both sides of any steps have handrails. Any raised decks and porches should have guardrails on the edges. Have any leaves, snow, or ice cleared regularly. Use sand or salt on walking paths during winter. Clean up any spills in your garage right away. This includes oil or grease spills. What can I do in the bathroom? Use night lights. Install grab bars by the toilet and in the tub and shower. Do not use towel bars as grab bars. Use non-skid mats or decals in the tub or shower. If you need to sit down in the shower, use a plastic, non-slip stool. Keep the floor dry. Clean up any water that spills on the floor  as soon as it happens. Remove soap buildup in the tub or shower regularly. Attach bath mats securely with double-sided non-slip rug tape. Do not have throw rugs and other things on the floor that can make you trip. What can I do in the bedroom? Use night lights. Make sure that you have a light by your bed that is easy to reach. Do not use any sheets or blankets that are too big for your bed. They should not hang down onto the floor. Have a firm chair that has side arms. You can use this for support while you get dressed. Do not have throw rugs and other things on the floor that can make you trip. What can I do in the kitchen? Clean up any spills right away. Avoid walking on wet floors. Keep items that you use a lot in easy-to-reach places. If you need to reach something above you, use a strong step stool that has a grab bar. Keep electrical cords out of the way. Do not use floor polish or wax that makes floors slippery. If you must use wax, use non-skid floor wax. Do not have throw rugs  and other things on the floor that can make you trip. What can I do with my stairs? Do not leave any items on the stairs. Make sure that there are handrails on both sides of the stairs and use them. Fix handrails that are broken or loose. Make sure that handrails are as long as the stairways. Check any carpeting to make sure that it is firmly attached to the stairs. Fix any carpet that is loose or worn. Avoid having throw rugs at the top or bottom of the stairs. If you do have throw rugs, attach them to the floor with carpet tape. Make sure that you have a light switch at the top of the stairs and the bottom of the stairs. If you do not have them, ask someone to add them for you. What else can I do to help prevent falls? Wear shoes that: Do not have high heels. Have rubber bottoms. Are comfortable and fit you well. Are closed at the toe. Do not wear sandals. If you use a stepladder: Make sure that it is  fully opened. Do not climb a closed stepladder. Make sure that both sides of the stepladder are locked into place. Ask someone to hold it for you, if possible. Clearly mark and make sure that you can see: Any grab bars or handrails. First and last steps. Where the edge of each step is. Use tools that help you move around (mobility aids) if they are needed. These include: Canes. Walkers. Scooters. Crutches. Turn on the lights when you go into a dark area. Replace any light bulbs as soon as they burn out. Set up your furniture so you have a clear path. Avoid moving your furniture around. If any of your floors are uneven, fix them. If there are any pets around you, be aware of where they are. Review your medicines with your doctor. Some medicines can make you feel dizzy. This can increase your chance of falling. Ask your doctor what other things that you can do to help prevent falls. This information is not intended to replace advice given to you by your health care provider. Make sure you discuss any questions you have with your health care provider. Document Released: 03/08/2009 Document Revised: 10/18/2015 Document Reviewed: 06/16/2014 Elsevier Interactive Patient Education  2017 [employer].

## 2022-12-12 NOTE — Progress Notes (Addendum)
Subjective:   Shannon Montgomery is a 88 y.o. female who presents for an Initial Medicare Annual Wellness Visit.  Visit Complete: Virtual  I connected with  Gypsy Lore on 12/12/22 by a audio enabled telemedicine application and verified that I am speaking with the correct person using two identifiers.  Patient Location: Home  Provider Location: Office/Clinic  I discussed the limitations of evaluation and management by telemedicine. The patient expressed understanding and agreed to proceed.  Per patient no change in vitals since last visit, unable to obtain new vitals due to telehealth visit     Review of Systems     Cardiac Risk Factors include: advanced age (>72men, >7 women)     Objective:    Today's Vitals   12/12/22 1325 12/12/22 1326  Weight: 136 lb (61.7 kg)   Height: 5\' 1"  (1.549 m)   PainSc:  7    Body mass index is 25.7 kg/m.     12/12/2022    1:32 PM 05/12/2022    7:00 AM 10/08/2020    8:50 AM 02/18/2017    3:18 PM 02/11/2017    3:18 PM 01/20/2016    8:59 AM 01/20/2016    4:26 AM  Advanced Directives  Does Patient Have a Medical Advance Directive? No No No No No No No  Would patient like information on creating a medical advance directive?  No - Patient declined Yes (ED - Information included in AVS) No - Patient declined No - Patient declined No - patient declined information     Current Medications (verified) Outpatient Encounter Medications as of 12/12/2022  Medication Sig   acetaminophen (TYLENOL 8 HOUR) 650 MG CR tablet Take 1 tablet (650 mg total) by mouth every 8 (eight) hours as needed for pain.   LORazepam (ATIVAN) 0.5 MG tablet Take 0.5 tablets (0.25 mg total) by mouth 2 (two) times daily as needed for anxiety or sleep.   carbamide peroxide (DEBROX) 6.5 % OTIC solution Put 5 drops in the right ear twice a day as needed up to 4 days (Patient not taking: Reported on 12/12/2022)   famotidine (PEPCID) 40 MG tablet Take 1 tablet (40 mg total) by  mouth daily. (Patient not taking: Reported on 12/12/2022)   meloxicam (MOBIC) 7.5 MG tablet Take 1 tablet (7.5 mg total) by mouth daily. (Patient not taking: Reported on 12/12/2022)   Multiple Vitamins-Minerals (CENTRUM ADULT PO) Take by mouth. (Patient not taking: Reported on 11/17/2022)   prednisoLONE acetate (PRED FORTE) 1 % ophthalmic suspension SMARTSIG:In Eye(s) (Patient not taking: Reported on 12/12/2022)   No facility-administered encounter medications on file as of 12/12/2022.    Allergies (verified) Other, Baclofen, Ciprofloxacin, Doxycycline, Duloxetine hcl, Erythromycin, Gabapentin, Hepatitis a vaccine, Ipratropium, Levofloxacin, Lyrica cr [pregabalin er], and Tramadol   History: Past Medical History:  Diagnosis Date   Acute non-recurrent frontal sinusitis 07/15/2022   Ankle fracture 06/08/2013   Anxiety    Arthritis    Asthma    Chronic back pain    Chronic sinusitis    Degenerative joint disease of knee, right    Fibromyalgia    GERD (gastroesophageal reflux disease)    Hyponatremia 01/20/2016   Leukocytosis 01/20/2016   Liver lesion, right lobe 01/20/2016   Open right ankle fracture 06/08/2013   Osteoarthritis    "full of it" (02/18/2017)   Pleural effusion on right 01/20/2016   PMR (polymyalgia rheumatica) (HCC)    Wears glasses    Past Surgical History:  Procedure Laterality Date  ABSCESS DRAINAGE Right 12/2015 - 01/2016 X 2   "inside abdomen"   ANKLE HARDWARE REMOVAL Right 04/2014   "partial hardware removed"   CATARACT EXTRACTION W/ INTRAOCULAR LENS  IMPLANT, BILATERAL Bilateral    CORNEAL TRANSPLANT Bilateral 2005-2007   "twice on left side"   EYE SURGERY     FRACTURE SURGERY     I & D EXTREMITY Right 06/08/2013   Procedure: IRRIGATION AND DEBRIDEMENT EXTREMITY/RIGHT ANKLE;  Surgeon: Mable Paris, MD;  Location: [facility] OR;  Service: Orthopedics;  Laterality: Right;   IR GENERIC HISTORICAL  02/07/2016   IR RADIOLOGIST EVAL & MGMT 02/07/2016 Darrell K  Allred, PA-C [facility] INTERV RAD   IR GENERIC HISTORICAL  02/21/2016   IR RADIOLOGIST EVAL & MGMT 02/21/2016 Barnetta Chapel, PA-C [facility] INTERV RAD   IR GUIDED DRAIN W CATHETER PLACEMENT  10/08/2020   IR RADIOLOGIST EVAL & MGMT  10/24/2020   JOINT REPLACEMENT     ORIF ANKLE FRACTURE Right 06/08/2013   Procedure: OPEN REDUCTION INTERNAL FIXATION (ORIF) ANKLE FRACTURE;  Surgeon: Mable Paris, MD;  Location: [facility] OR;  Service: Orthopedics;  Laterality: Right;   TOTAL KNEE ARTHROPLASTY Right 02/17/2017   TOTAL KNEE ARTHROPLASTY Right 02/17/2017   Procedure: TOTAL KNEE ARTHROPLASTY;  Surgeon: Marcene Corning, MD;  Location: [facility] OR;  Service: Orthopedics;  Laterality: Right;   Family History  Problem Relation Age of Onset   Breast cancer Mother    Anuerysm Father    Diabetes Brother    Brain cancer Brother    Alcoholism Brother    Social History   Socioeconomic History   Marital status: Married    Spouse name: Not on file   Number of children: Not on file   Years of education: Not on file   Highest education level: Not on file  Occupational History   Not on file  Tobacco Use   Smoking status: Never    Passive exposure: Never   Smokeless tobacco: Never  Vaping Use   Vaping status: Never Used  Substance and Sexual Activity   Alcohol use: No   Drug use: No   Sexual activity: Never    Birth control/protection: None  Other Topics Concern   Not on file  Social History Narrative   Not on file   Social Determinants of Health   Financial Resource Strain: Low Risk  (12/12/2022)   Overall Financial Resource Strain (CARDIA)    Difficulty of Paying Living Expenses: Not hard at all  Food Insecurity: No Food Insecurity (12/12/2022)   Hunger Vital Sign    Worried About Running Out of Food in the Last Year: Never true    Ran Out of Food in the Last Year: Never true  Transportation Needs: No Transportation Needs (12/12/2022)   PRAPARE - Administrator, Civil Service (Medical):  No    Lack of Transportation (Non-Medical): No  Physical Activity: Inactive (12/12/2022)   Exercise Vital Sign    Days of Exercise per Week: 0 days    Minutes of Exercise per Session: 0 min  Stress: No Stress Concern Present (12/12/2022)   [employer] of Occupational Health - Occupational Stress Questionnaire    Feeling of Stress : Not at all  Social Connections: Moderately Isolated (12/12/2022)   Social Connection and Isolation Panel [NHANES]    Frequency of Communication with Friends and Family: More than three times a week    Frequency of Social Gatherings with Friends and Family: Once a week    Attends  Religious Services: Never    Active Member of Clubs or Organizations: No    Attends Banker Meetings: Never    Marital Status: Married    Tobacco Counseling Counseling given: Not Answered   Clinical Intake:  Pre-visit preparation completed: Yes  Pain : 0-10 Pain Score: 7  Pain Type: Chronic pain Pain Location: Generalized Pain Descriptors / Indicators: Aching Pain Onset: More than a month ago Pain Frequency: Constant     Nutritional Status: BMI 25 -29 Overweight Nutritional Risks: None Diabetes: No  How often do you need to have someone help you when you read instructions, pamphlets, or other written materials from your doctor or pharmacy?: 1 - Never  Interpreter Needed?: No  Information entered by :: NAllen LPN   Activities of Daily Living    12/12/2022    1:28 PM  In your present state of health, do you have any difficulty performing the following activities:  Hearing? 1  Comment slight decrease in hearing  Vision? 0  Difficulty concentrating or making decisions? 0  Walking or climbing stairs? 1  Dressing or bathing? 0  Doing errands, shopping? 0  Preparing Food and eating ? N  Using the Toilet? N  In the past six months, have you accidently leaked urine? N  Do you have problems with loss of bowel control? N  Managing your  Medications? N  Managing your Finances? N  Housekeeping or managing your Housekeeping? N    Patient Care Team: Garnette Gunner, MD as PCP - General (Family Medicine)  Indicate any recent Medical Services you may have received from other than [facility] providers in the past year (date may be approximate).     Assessment:   This is a routine wellness examination for Tarika.  Hearing/Vision screen Hearing Screening - Comments:: Slight decrease Vision Screening - Comments:: Regular eye exams, [facility]  Dietary issues and exercise activities discussed:     Goals Addressed             This Visit's Progress    Patient Stated       12/12/2022, denies any goals at this time       Depression Screen    12/12/2022    1:34 PM 09/10/2022   11:23 AM 06/26/2022    2:37 PM  PHQ 2/9 Scores  PHQ - 2 Score 1 0 2  PHQ- 9 Score 1 2 4     Fall Risk    12/12/2022    1:33 PM 12/22/2018    5:56 PM  Fall Risk   Falls in the past year? 0 --  Comment  Emmi Telephone Survey: data to providers prior to load  Number falls in past yr: 0 --  Comment  Emmi Telephone Survey Actual Response =   Injury with Fall? 0   Risk for fall due to : Impaired mobility;Impaired balance/gait;Medication side effect   Follow up Falls prevention discussed;Falls evaluation completed     MEDICARE RISK AT HOME:  Medicare Risk at Home - 12/12/22 1333     Any stairs in or around the home? Yes    If so, are there any without handrails? No    Home free of loose throw rugs in walkways, pet beds, electrical cords, etc? Yes    Adequate lighting in your home to reduce risk of falls? Yes    Life alert? No    Use of a cane, walker or w/c? Yes    Grab bars in the bathroom? No  Shower chair or bench in shower? Yes    Elevated toilet seat or a handicapped toilet? No             TIMED UP AND GO:  Was the test performed? No    Cognitive Function:        12/12/2022    1:35 PM  6CIT Screen  What Year? 0  points  What month? 0 points  What time? 0 points  Count back from 20 0 points  Months in reverse 4 points  Repeat phrase 0 points  Total Score 4 points    Immunizations Immunization History  Administered Date(s) Administered   DTaP 06/04/2013   Influenza, High Dose Seasonal PF 04/09/2016, 01/30/2017   Influenza, Quadrivalent, Recombinant, Inj, Pf 03/18/2018, 04/27/2020, 02/06/2021   Influenza-Unspecified 02/06/2017, 02/23/2018   PFIZER(Purple Top)SARS-COV-2 Vaccination 07/01/2019, 07/27/2019   Pneumococcal Conjugate-13 05/12/2018   Pneumococcal Polysaccharide-23 04/25/2008   Tdap 06/08/2013    TDAP status: Up to date  Flu Vaccine status: Up to date  Pneumococcal vaccine status: Up to date  Covid-19 vaccine status: Information provided on how to obtain vaccines.   Qualifies for Shingles Vaccine? Yes   Zostavax completed No   Shingrix Completed?: No.    Education has been provided regarding the importance of this vaccine. Patient has been advised to call insurance company to determine out of pocket expense if they have not yet received this vaccine. Advised may also receive vaccine at local pharmacy or Health Dept. Verbalized acceptance and understanding.  Screening Tests Health Maintenance  Topic Date Due   Zoster Vaccines- Shingrix (1 of 2) Never done   DEXA SCAN  Never done   COVID-19 Vaccine (3 - 2023-24 season) 01/24/2022   INFLUENZA VACCINE  12/25/2022   DTaP/Tdap/Td (3 - Td or Tdap) 06/09/2023   Medicare Annual Wellness (AWV)  12/12/2023   Pneumonia Vaccine 82+ Years old  Completed   HPV VACCINES  Aged Out    Health Maintenance  Health Maintenance Due  Topic Date Due   Zoster Vaccines- Shingrix (1 of 2) Never done   DEXA SCAN  Never done   COVID-19 Vaccine (3 - 2023-24 season) 01/24/2022    Colorectal cancer screening: No longer required.   Mammogram status: No longer required due to age.  Bone Density status: decline  Lung Cancer Screening: (Low  Dose CT Chest recommended if Age 7-80 years, 20 pack-year currently smoking OR have quit w/in 15years.) does not qualify.   Lung Cancer Screening Referral: no  Additional Screening:  Hepatitis C Screening: does not qualify;   Vision Screening: Recommended annual ophthalmology exams for early detection of glaucoma and other disorders of the eye. Is the patient up to date with their annual eye exam?  Yes  Who is the provider or what is the name of the office in which the patient attends annual eye exams? [facility] If pt is not established with a provider, would they like to be referred to a provider to establish care? No .   Dental Screening: Recommended annual dental exams for proper oral hygiene  Diabetic Foot Exam: n/a  Community Resource Referral / Chronic Care Management: CRR required this visit?  No   CCM required this visit?  No     Plan:     I have personally reviewed and noted the following in the patient's chart:   Medical and social history Use of alcohol, tobacco or illicit drugs  Current medications and supplements including opioid prescriptions. Patient is not  currently taking opioid prescriptions. Functional ability and status Nutritional status Physical activity Advanced directives List of other physicians Hospitalizations, surgeries, and ER visits in previous 12 months Vitals Screenings to include cognitive, depression, and falls Referrals and appointments  In addition, I have reviewed and discussed with patient certain preventive protocols, quality metrics, and best practice recommendations. A written personalized care plan for preventive services as well as general preventive health recommendations were provided to patient.     Barb Merino, LPN   [id number]   After Visit Summary: (MyChart) Due to this being a telephonic visit, the after visit summary with patients personalized plan was offered to patient via MyChart   Nurse Notes:  none

## 2022-12-12 NOTE — Telephone Encounter (Signed)
Did not make appointment for next year at this time.

## 2022-12-26 ENCOUNTER — Other Ambulatory Visit: Payer: Self-pay | Admitting: Family Medicine

## 2022-12-26 DIAGNOSIS — R051 Acute cough: Secondary | ICD-10-CM

## 2022-12-26 DIAGNOSIS — J0110 Acute frontal sinusitis, unspecified: Secondary | ICD-10-CM

## 2023-02-02 ENCOUNTER — Encounter: Payer: Self-pay | Admitting: Family Medicine

## 2023-02-02 ENCOUNTER — Ambulatory Visit (INDEPENDENT_AMBULATORY_CARE_PROVIDER_SITE_OTHER): Payer: Medicare Other | Admitting: Family Medicine

## 2023-02-02 VITALS — BP 120/74 | HR 58 | Temp 97.8°F | Wt 130.4 lb

## 2023-02-02 DIAGNOSIS — Z23 Encounter for immunization: Secondary | ICD-10-CM | POA: Diagnosis not present

## 2023-02-02 DIAGNOSIS — K30 Functional dyspepsia: Secondary | ICD-10-CM | POA: Insufficient documentation

## 2023-02-02 DIAGNOSIS — R413 Other amnesia: Secondary | ICD-10-CM | POA: Diagnosis not present

## 2023-02-02 DIAGNOSIS — F419 Anxiety disorder, unspecified: Secondary | ICD-10-CM | POA: Diagnosis not present

## 2023-02-02 DIAGNOSIS — F5101 Primary insomnia: Secondary | ICD-10-CM | POA: Diagnosis not present

## 2023-02-02 MED ORDER — BELSOMRA 5 MG PO TABS: 5.0000 mg | ORAL_TABLET | Freq: Every evening | ORAL | 2 refills | Status: AC | PRN

## 2023-02-02 MED ORDER — ONDANSETRON HCL 4 MG PO TABS: 4.0000 mg | ORAL_TABLET | Freq: Three times a day (TID) | ORAL | 0 refills | Status: DC | PRN

## 2023-02-02 NOTE — Assessment & Plan Note (Signed)
Improved with CBT, but still present. Continue current therapy and monitor symptoms off lorazepam

## 2023-02-02 NOTE — Progress Notes (Signed)
Assessment/Plan:   Problem List Items Addressed This Visit       Other   Anxiety    Improved with CBT, but still present. Continue current therapy and monitor symptoms off lorazepam      Primary insomnia    Rebound insomnia noted following Ativan discontinuation. Plan: Start Belsomra 5 mg at bedtime as needed. Provide information on CBTI and sleep hygiene. Follow-up in one month to assess effectiveness.      Relevant Medications   Suvorexant (BELSOMRA) 5 MG TABS   Memory change    Possibly age-related or secondary to anxiety. Plan: Monitor memory issues closely. Offered referral to neurology for further cognitive evaluation, but referral declined at this time      Upset stomach    Attributed to nervousness. Plan: Monitor symptoms. Ondansetron 4 mg tablet for nausea if needed.      Relevant Medications   ondansetron (ZOFRAN) 4 MG tablet   Other Visit Diagnoses     Immunization due    -  Primary   Relevant Orders   Flu Vaccine Trivalent High Dose (Fluad) (Completed)       There are no discontinued medications.  Return in about 4 weeks (around 03/02/2023), or if symptoms worsen or fail to improve, for insomnia.    Subjective:   Encounter date: 02/02/2023  Shannon Montgomery is a 88 y.o. female who has Vitamin D deficiency; Esophagitis; Fibromyalgia; PMR (polymyalgia rheumatica) (HCC); Primary osteoarthritis of right knee; Retroperitoneal abscess (HCC); Chronic nonintractable headache; Anxiety; Chronic pain syndrome; Diverticula of colon; Age-related cataract of both eyes; Hearing loss of right ear due to cerumen impaction; Primary insomnia; Memory change; and Upset stomach on their problem list..   She  has a past medical history of Acute non-recurrent frontal sinusitis (07/15/2022), Ankle fracture (06/08/2013), Anxiety, Arthritis, Asthma, Chronic back pain, Chronic sinusitis, Degenerative joint disease of knee, right, Fibromyalgia, GERD (gastroesophageal  reflux disease), Hyponatremia (01/20/2016), Leukocytosis (01/20/2016), Liver lesion, right lobe (01/20/2016), Open right ankle fracture (06/08/2013), Osteoarthritis, Pleural effusion on right (01/20/2016), PMR (polymyalgia rheumatica) (HCC), and Wears glasses..   Chief Complaint: Difficulty with sleep and various symptoms post-Ativan discontinuation.  History of Present Illness:  Insomnia: Approximately 3 weeks ago, patient experienced confusion, short-term memory changes, upset stomach, headache, muscle aches.  Due to concern that this was associated with Ativan use, patient was discontinued by her husband.  The patient continues to report significant difficulty sleeping. Previously took 0.25 mg at night for sleep. Since stopping, she experiences rebound insomnia, waking up during the night and having difficulty falling back asleep. Typically sleeps from 9 pm to midnight, remains awake for 1-2 hours, then falls back asleep. Despite staying in bed around 11 hours, she doesn't feel fully rested and has low energy during the day.  Memory Concerns: Noted some short-term memory issues, with occasional forgetfulness, but long-term memory remains good. The patient feels she is back to baseline cognitive functionality.  Anxiety and Therapy: Reports undergoing CBT, significantly helping her anxiety symptoms.  Physical Symptoms: Occasional "flippy" stomach attributed to nervousness, especially when lying down for sleep. No vomiting or diarrhea.   Review of Systems  Constitutional:  Positive for malaise/fatigue and weight loss (Appears to be stabilizing.). Negative for chills, diaphoresis and fever.  HENT:  Positive for hearing loss. Negative for congestion, ear discharge and ear pain.   Eyes:  Negative for blurred vision, double vision, photophobia, pain, discharge and redness.  Respiratory:  Negative for cough, sputum production, shortness of breath and wheezing.  Cardiovascular:  Negative for chest pain  and palpitations.  Gastrointestinal:  Positive for nausea. Negative for abdominal pain, blood in stool, constipation, diarrhea, heartburn, melena and vomiting.  Genitourinary:  Negative for dysuria, flank pain, frequency, hematuria and urgency.  Musculoskeletal:  Positive for back pain, joint pain and myalgias.  Skin:  Negative for itching and rash.  Neurological:  Positive for dizziness. Negative for tingling, tremors, speech change, seizures, loss of consciousness, weakness and headaches.  Endo/Heme/Allergies:  Negative for polydipsia.  Psychiatric/Behavioral:  Positive for depression and memory loss. Negative for hallucinations, substance abuse and suicidal ideas. The patient is nervous/anxious and has insomnia.   All other systems reviewed and are negative.   Past Surgical History:  Procedure Laterality Date   ABSCESS DRAINAGE Right 12/2015 - 01/2016 X 2   "inside abdomen"   ANKLE HARDWARE REMOVAL Right 04/2014   "partial hardware removed"   CATARACT EXTRACTION W/ INTRAOCULAR LENS  IMPLANT, BILATERAL Bilateral    CORNEAL TRANSPLANT Bilateral 2005-2007   "twice on left side"   EYE SURGERY     FRACTURE SURGERY     I & D EXTREMITY Right 06/08/2013   Procedure: IRRIGATION AND DEBRIDEMENT EXTREMITY/RIGHT ANKLE;  Surgeon: Mable Paris, MD;  Location: [facility] OR;  Service: Orthopedics;  Laterality: Right;   IR GENERIC HISTORICAL  02/07/2016   IR RADIOLOGIST EVAL & MGMT 02/07/2016 Darrell K Allred, PA-C [facility] INTER[facility] RAD   IR GENERIC HISTORICAL  02/21/2016   IR RADIOLOGIST EVAL & MGMT 02/21/2016 Barnetta Chapel, PA-C [facility]   IR GUIDED DRAIN W CATHETER PLACEMENT  10/08/2020   IR RADIOLOGIST EVAL & MGMT  10/24/2020   JOINT REPLACEMENT     ORIF ANKLE FRACTURE Right 06/08/2013   Procedure: OPEN REDUCTION INTERNAL FIXATION (ORIF) ANKLE FRACTURE;  Surgeon: Mable Paris, MD;  Location: [facility] OR;  Service: Orthopedics;  Laterality: Right;   TOTAL KNEE ARTHROPLASTY Right 02/17/2017    TOTAL KNEE ARTHROPLASTY Right 02/17/2017   Procedure: TOTAL KNEE ARTHROPLASTY;  Surgeon: Marcene Corning, MD;  Location: [facility] OR;  Service: Orthopedics;  Laterality: Right;    Outpatient Medications Prior to Visit  Medication Sig Dispense Refill   acetaminophen (TYLENOL 8 HOUR) 650 MG CR tablet Take 1 tablet (650 mg total) by mouth every 8 (eight) hours as needed for pain.     Multiple Vitamins-Minerals (CENTRUM ADULT PO) Take by mouth.     benzonatate (TESSALON) 100 MG capsule TAKE ONE CAPSULE BY MOUTH TWICE A DAY AS NEEDED FOR COUGH 20 capsule 0   carbamide peroxide (DEBROX) 6.5 % OTIC solution Put 5 drops in the right ear twice a day as needed up to 4 days (Patient not taking: Reported on 12/12/2022) 15 mL 0   famotidine (PEPCID) 40 MG tablet Take 1 tablet (40 mg total) by mouth daily. (Patient not taking: Reported on 12/12/2022) 30 tablet 0   prednisoLONE acetate (PRED FORTE) 1 % ophthalmic suspension SMARTSIG:In Eye(s) (Patient not taking: Reported on 12/12/2022)     No facility-administered medications prior to visit.    Family History  Problem Relation Age of Onset   Breast cancer Mother    Anuerysm Father    Diabetes Brother    Brain cancer Brother    Alcoholism Brother     Social History   Socioeconomic History   Marital status: Married    Spouse name: Not on file   Number of children: Not on file   Years of education: Not on file   Highest education level:  Not on file  Occupational History   Not on file  Tobacco Use   Smoking status: Never    Passive exposure: Never   Smokeless tobacco: Never  Vaping Use   Vaping status: Never Used  Substance and Sexual Activity   Alcohol use: No   Drug use: No   Sexual activity: Never    Birth control/protection: None  Other Topics Concern   Not on file  Social History Narrative   Not on file   Social Determinants of Health   Financial Resource Strain: Low Risk  (12/12/2022)   Overall Financial Resource Strain (CARDIA)     Difficulty of Paying Living Expenses: Not hard at all  Food Insecurity: No Food Insecurity (12/12/2022)   Hunger Vital Sign    Worried About Running Out of Food in the Last Year: Never true    Ran Out of Food in the Last Year: Never true  Transportation Needs: No Transportation Needs (12/12/2022)   PRAPARE - Administrator, Civil Service (Medical): No    Lack of Transportation (Non-Medical): No  Physical Activity: Inactive (12/12/2022)   Exercise Vital Sign    Days of Exercise per Week: 0 days    Minutes of Exercise per Session: 0 min  Stress: No Stress Concern Present (12/12/2022)   [employer] of Occupational Health - Occupational Stress Questionnaire    Feeling of Stress : Not at all  Social Connections: Moderately Isolated (12/12/2022)   Social Connection and Isolation Panel [NHANES]    Frequency of Communication with Friends and Family: More than three times a week    Frequency of Social Gatherings with Friends and Family: Once a week    Attends Religious Services: Never    Database administrator or Organizations: No    Attends Banker Meetings: Never    Marital Status: Married  Catering manager Violence: Not At Risk (12/12/2022)   Humiliation, Afraid, Rape, and Kick questionnaire    Fear of Current or Ex-Partner: No    Emotionally Abused: No    Physically Abused: No    Sexually Abused: No                                                                                                  Objective:  Physical Exam: BP 120/74 (BP Location: Left Arm, Patient Position: Sitting, Cuff Size: Large)   Pulse (!) 58   Temp 97.8 F (36.6 C) (Temporal)   Wt 130 lb 6.4 oz (59.1 kg)   SpO2 97%   BMI 24.64 kg/m   Wt Readings from Last 3 Encounters:  02/02/23 130 lb 6.4 oz (59.1 kg)  12/12/22 136 lb (61.7 kg)  11/17/22 128 lb 9.6 oz (58.3 kg)     Physical Exam Constitutional:      General: She is not in acute distress.    Appearance: Normal appearance.  She is not ill-appearing or toxic-appearing.  HENT:     Head: Normocephalic and atraumatic.     Nose: Nose normal. No congestion.  Eyes:     General: No scleral icterus.  Extraocular Movements: Extraocular movements intact.  Cardiovascular:     Rate and Rhythm: Normal rate and regular rhythm.     Pulses: Normal pulses.     Heart sounds: Normal heart sounds.  Pulmonary:     Effort: Pulmonary effort is normal. No respiratory distress.     Breath sounds: Normal breath sounds.  Abdominal:     General: Abdomen is flat. Bowel sounds are normal. There is no distension.     Palpations: Abdomen is soft.     Tenderness: There is no abdominal tenderness. There is no guarding or rebound.  Musculoskeletal:        General: Normal range of motion.     Comments: Extensive kyphosis  Lymphadenopathy:     Cervical: No cervical adenopathy.  Skin:    General: Skin is warm and dry.     Findings: No rash.  Neurological:     General: No focal deficit present.     Mental Status: She is alert and oriented to person, place, and time. Mental status is at baseline.     Gait: Gait abnormal (Uses walker).  Psychiatric:        Mood and Affect: Mood is anxious.        Behavior: Behavior normal.        Thought Content: Thought content normal.        Judgment: Judgment normal.     No results found.  No results found for this or any previous visit (from the past 2160 hour(s)).      Garner Nash, MD, MS

## 2023-02-02 NOTE — Assessment & Plan Note (Signed)
Possibly age-related or secondary to anxiety. Plan: Monitor memory issues closely. Offered referral to neurology for further cognitive evaluation, but referral declined at this time

## 2023-02-02 NOTE — Assessment & Plan Note (Signed)
Attributed to nervousness. Plan: Monitor symptoms. Ondansetron 4 mg tablet for nausea if needed.

## 2023-02-02 NOTE — Assessment & Plan Note (Signed)
Rebound insomnia noted following Ativan discontinuation. Plan: Start Belsomra 5 mg at bedtime as needed. Provide information on CBTI and sleep hygiene. Follow-up in one month to assess effectiveness.

## 2023-02-02 NOTE — Patient Instructions (Addendum)
For insomnia, try belsomra 5 mg at night. Monitor for drowsiness the next day.   Take zofran 4 mg as needed for nausea.   Follow up with therapy for CBT-I.   Good sleep habits (sometimes referred to as "sleep hygiene") can help you get a good night's sleep.  Some habits that can improve your sleep health:  Be consistent. Go to bed at the same time each night and get up at the same time each morning, including on the weekends Make sure your bedroom is quiet, dark, relaxing, and at a comfortable temperature Remove electronic devices, such as TVs, computers, and smart phones, from the bedroom Avoid large meals, caffeine, and alcohol before bedtime Get some exercise. Being physically active during the day can help you fall asleep more easily at night. For more  [email or web address]

## 2023-02-10 ENCOUNTER — Encounter: Payer: Self-pay | Admitting: Family Medicine

## 2023-04-02 ENCOUNTER — Other Ambulatory Visit: Payer: Self-pay | Admitting: Family Medicine

## 2023-04-02 DIAGNOSIS — K30 Functional dyspepsia: Secondary | ICD-10-CM

## 2023-06-01 ENCOUNTER — Other Ambulatory Visit: Payer: Self-pay | Admitting: Family Medicine

## 2023-06-01 DIAGNOSIS — K30 Functional dyspepsia: Secondary | ICD-10-CM

## 2023-06-14 ENCOUNTER — Other Ambulatory Visit: Payer: Self-pay | Admitting: Family Medicine

## 2023-06-14 DIAGNOSIS — J0110 Acute frontal sinusitis, unspecified: Secondary | ICD-10-CM

## 2023-06-14 DIAGNOSIS — R051 Acute cough: Secondary | ICD-10-CM

## 2023-06-16 ENCOUNTER — Other Ambulatory Visit: Payer: Self-pay | Admitting: Family Medicine

## 2023-06-16 DIAGNOSIS — J0110 Acute frontal sinusitis, unspecified: Secondary | ICD-10-CM

## 2023-06-16 DIAGNOSIS — R051 Acute cough: Secondary | ICD-10-CM

## 2023-07-12 ENCOUNTER — Other Ambulatory Visit: Payer: Self-pay | Admitting: Family Medicine

## 2023-07-12 DIAGNOSIS — K30 Functional dyspepsia: Secondary | ICD-10-CM

## 2023-08-25 ENCOUNTER — Other Ambulatory Visit: Payer: Self-pay | Admitting: Family Medicine

## 2023-08-25 DIAGNOSIS — K30 Functional dyspepsia: Secondary | ICD-10-CM

## 2023-09-22 ENCOUNTER — Other Ambulatory Visit: Payer: Self-pay | Admitting: Family Medicine

## 2023-09-22 DIAGNOSIS — K30 Functional dyspepsia: Secondary | ICD-10-CM

## 2023-10-15 ENCOUNTER — Telehealth: Payer: Self-pay

## 2023-10-15 ENCOUNTER — Ambulatory Visit (INDEPENDENT_AMBULATORY_CARE_PROVIDER_SITE_OTHER): Admitting: Family Medicine

## 2023-10-15 ENCOUNTER — Encounter: Payer: Self-pay | Admitting: Family Medicine

## 2023-10-15 VITALS — BP 130/80 | HR 95 | Temp 97.4°F | Resp 18 | Wt 130.6 lb

## 2023-10-15 DIAGNOSIS — R110 Nausea: Secondary | ICD-10-CM | POA: Diagnosis not present

## 2023-10-15 DIAGNOSIS — S32030S Wedge compression fracture of third lumbar vertebra, sequela: Secondary | ICD-10-CM

## 2023-10-15 DIAGNOSIS — M25559 Pain in unspecified hip: Secondary | ICD-10-CM | POA: Diagnosis not present

## 2023-10-15 DIAGNOSIS — K30 Functional dyspepsia: Secondary | ICD-10-CM

## 2023-10-15 DIAGNOSIS — R519 Headache, unspecified: Secondary | ICD-10-CM | POA: Diagnosis not present

## 2023-10-15 DIAGNOSIS — G894 Chronic pain syndrome: Secondary | ICD-10-CM

## 2023-10-15 DIAGNOSIS — G4700 Insomnia, unspecified: Secondary | ICD-10-CM

## 2023-10-15 DIAGNOSIS — M549 Dorsalgia, unspecified: Secondary | ICD-10-CM

## 2023-10-15 DIAGNOSIS — G8929 Other chronic pain: Secondary | ICD-10-CM

## 2023-10-15 DIAGNOSIS — F419 Anxiety disorder, unspecified: Secondary | ICD-10-CM

## 2023-10-15 MED ORDER — ONDANSETRON HCL 4 MG PO TABS: 4.0000 mg | ORAL_TABLET | Freq: Three times a day (TID) | ORAL | 3 refills | Status: AC | PRN

## 2023-10-15 MED ORDER — LORAZEPAM 0.5 MG PO TABS: 0.2500 mg | ORAL_TABLET | Freq: Two times a day (BID) | ORAL | 2 refills | Status: DC | PRN

## 2023-10-15 MED ORDER — OXYCODONE-ACETAMINOPHEN 5-325 MG PO TABS: 2.5000 | ORAL_TABLET | Freq: Three times a day (TID) | ORAL | 0 refills | Status: DC | PRN

## 2023-10-15 NOTE — Progress Notes (Signed)
 Assessment & Plan   Assessment/Plan:    Assessment & Plan Chronic back pain Chronic back pain with significant discomfort, described as an ache, not relieved by acetaminophen  and codeine, oxycodone , or tramadol . Previous trials of duloxetine, pregabalin, and gabapentin were not tolerated. Pain is exacerbated by exercise and not relieved by physical therapy. Heat provides some relief. She is not interested in aqua therapy. Considering a trial of low-dose oxycodone  for pain management. - Prescribe oxycodone  2.5 to 5 mg every 8 hours as needed for pain, with a maximum of 30 tablets. Monitor for constipation and respiratory depression. - Advise against taking oxycodone  with acetaminophen  and codeine. - Discuss potential side effects of oxycodone , including constipation, respiratory depression, and dependence.  Chronic hip pain Chronic hip pain, possibly related to prolonged sitting. Pain is not relieved by exercise or physical therapy. Heat provides some relief.  Morning headaches Intermittent morning headaches, possibly related to chronic use of acetaminophen . Caffeine intake provides some relief.  Nausea Intermittent nausea, relieved by ondansetron  and the use of a heating pad. Ondansetron  is effective and well-tolerated. Insurance limits coverage to 20 tablets at a time. - Prescribe ondansetron  as needed for nausea.  Insomnia Insomnia with difficulty maintaining sleep, possibly exacerbated by pain and frequent urination due to high water intake. Previous use of suvorexant  resulted in nightmares.  Anxiety Chronic, much improved.  Typically uses Ativan  0.25 mg weekly as needed.  This is typically When traveling or stressful week.    Follow-up Follow-up to assess the effectiveness of the new pain management regimen and overall symptom control. - Schedule follow-up appointment in two weeks to evaluate the effectiveness of oxycodone  and overall symptom management.      Medications  Discontinued During This Encounter  Medication Reason   acetaminophen  (TYLENOL  8 HOUR) 650 MG CR tablet    famotidine  (PEPCID ) 40 MG tablet    acetaminophen -codeine (TYLENOL  #3) 300-30 MG tablet    ondansetron  (ZOFRAN ) 4 MG tablet Reorder    Return in about 2 weeks (around 10/29/2023) for back pain .        Subjective:   Encounter date: 10/15/2023  Shannon Montgomery is a 89 y.o. female who has Vitamin D  deficiency; Esophagitis; Fibromyalgia; PMR (polymyalgia rheumatica) (HCC); Primary osteoarthritis of right knee; Retroperitoneal abscess (HCC); Chronic nonintractable headache; Anxiety; Chronic pain syndrome; Diverticula of colon; Age-related cataract of both eyes; Hearing loss of right ear due to cerumen impaction; Primary insomnia; Memory change; and Upset stomach on their problem list..   She  has a past medical history of Acute non-recurrent frontal sinusitis (07/15/2022), Ankle fracture (06/08/2013), Anxiety, Arthritis, Asthma, Chronic back pain, Chronic sinusitis, Degenerative joint disease of knee, right, Fibromyalgia, GERD (gastroesophageal reflux disease), Hyponatremia (01/20/2016), Leukocytosis (01/20/2016), Liver lesion, right lobe (01/20/2016), Open right ankle fracture (06/08/2013), Osteoarthritis, Pleural effusion on right (01/20/2016), PMR (polymyalgia rheumatica) (HCC), and Wears glasses..   She presents with chief complaint of Pain Management (Pt c/o of chronic back pain with ongoing headaches. Pt is using tylenol  for symptoms //HM due- vaccinations and dexa scan ( pt declined) ) and Abdominal Pain (Pt c/o of mild abdominal pain; last BM was last night with decrease in appetite; no vomiting occurred ) .   Discussed the use of AI scribe software for clinical note transcription with the patient, who gave verbal consent to proceed.  History of Present Illness Shannon EDUARDO "Lauren-Ann" is an 89 year old female who presents with severe back pain.  She experiences severe back  pain located under  the rib, radiating from the back, and exacerbated by exercise and prolonged sitting. The pain is described as 'killing' her. She has tried multiple medications including Tylenol , Tylenol  3 with codeine, oxycodone , Cymbalta, Lyrica, gabapentin, Celebrex , and tramadol , without significant relief. She has undergone x-rays and consultations with various doctors, including an orthopedic specialist.  She frequently experiences morning headaches, which are managed with caffeine from Coke. She also reports a 'flippy stomach' at night, alleviated by ondansetron  and a heating pad. Ondansetron  is not used daily and is refilled as needed.  She has a persistent cough managed with a homemade remedy of vinegar, ginger tea, and honey. Tessalon  Perles were effective in the past, but she has not refilled the prescription recently.  She has difficulty sleeping, often waking up multiple times at night to urinate, attributed to high water intake. She has tried Belsomra  for sleep but experienced nightmares. Lorazepam  is used occasionally for stress, about half a tablet a week.  She expresses significant frustration with her current limitations, feeling unable to perform household tasks and concerned about declining physical capabilities.       Past Surgical History:  Procedure Laterality Date   ABSCESS DRAINAGE Right 12/2015 - 01/2016 X 2   "inside abdomen"   ANKLE HARDWARE REMOVAL Right 04/2014   "partial hardware removed"   CATARACT EXTRACTION W/ INTRAOCULAR LENS  IMPLANT, BILATERAL Bilateral    CORNEAL TRANSPLANT Bilateral 2005-2007   "twice on left side"   EYE SURGERY     FRACTURE SURGERY     I & D EXTREMITY Right 06/08/2013   Procedure: IRRIGATION AND DEBRIDEMENT EXTREMITY/RIGHT ANKLE;  Surgeon: Derald Flattery, MD;  Location: [facility] OR;  Service: Orthopedics;  Laterality: Right;   IR GENERIC HISTORICAL  02/07/2016   IR RADIOLOGIST EVAL & MGMT 02/07/2016 Darrell K Allred, PA-C [facility]  INTERV RAD   IR GENERIC HISTORICAL  02/21/2016   IR RADIOLOGIST EVAL & MGMT 02/21/2016 Marlin Simmonds, PA-C [facility] INTERV RAD   IR GUIDED DRAIN W CATHETER PLACEMENT  10/08/2020   IR RADIOLOGIST EVAL & MGMT  10/24/2020   JOINT REPLACEMENT     ORIF ANKLE FRACTURE Right 06/08/2013   Procedure: OPEN REDUCTION INTERNAL FIXATION (ORIF) ANKLE FRACTURE;  Surgeon: Derald Flattery, MD;  Location: [facility] OR;  Service: Orthopedics;  Laterality: Right;   TOTAL KNEE ARTHROPLASTY Right 02/17/2017   TOTAL KNEE ARTHROPLASTY Right 02/17/2017   Procedure: TOTAL KNEE ARTHROPLASTY;  Surgeon: Dayne Even, MD;  Location: [facility] OR;  Service: Orthopedics;  Laterality: Right;    Outpatient Medications Prior to Visit  Medication Sig Dispense Refill   benzonatate  (TESSALON ) 100 MG capsule TAKE ONE CAPSULE BY MOUTH TWICE A DAY AS NEEDED FOR COUGH 20 capsule 0   carbamide peroxide (DEBROX) 6.5 % OTIC solution Put 5 drops in the right ear twice a day as needed up to 4 days 15 mL 0   fluticasone  (FLONASE ) 50 MCG/ACT nasal spray Place into both nostrils.     Multiple Vitamins-Minerals (CENTRUM ADULT PO) Take by mouth.     prednisoLONE acetate (PRED FORTE) 1 % ophthalmic suspension      acetaminophen -codeine (TYLENOL  #3) 300-30 MG tablet Take 1 tablet by mouth 3 (three) times daily as needed.     ondansetron  (ZOFRAN ) 4 MG tablet TAKE ONE TABLET BY MOUTH EVERY 8 HOURS AS NEEDED FOR NAUSEA OR VOMITING 20 tablet 0   acetaminophen  (TYLENOL  8 HOUR) 650 MG CR tablet Take 1 tablet (650 mg total) by mouth every 8 (eight) hours as needed  for pain. (Patient not taking: Reported on 10/15/2023)     famotidine  (PEPCID ) 40 MG tablet Take 1 tablet (40 mg total) by mouth daily. (Patient not taking: Reported on 12/12/2022) 30 tablet 0   No facility-administered medications prior to visit.    Family History  Problem Relation Age of Onset   Breast cancer Mother    Anuerysm Father    Diabetes Brother    Brain cancer Brother    Alcoholism  Brother     Social History   Socioeconomic History   Marital status: Married    Spouse name: Not on file   Number of children: Not on file   Years of education: Not on file   Highest education level: Not on file  Occupational History   Not on file  Tobacco Use   Smoking status: Never    Passive exposure: Never   Smokeless tobacco: Never  Vaping Use   Vaping status: Never Used  Substance and Sexual Activity   Alcohol use: No   Drug use: No   Sexual activity: Never    Birth control/protection: None  Other Topics Concern   Not on file  Social History Narrative   Not on file   Social Drivers of Health   Financial Resource Strain: Low Risk  (12/12/2022)   Overall Financial Resource Strain (CARDIA)    Difficulty of Paying Living Expenses: Not hard at all  Food Insecurity: No Food Insecurity (12/12/2022)   Hunger Vital Sign    Worried About Running Out of Food in the Last Year: Never true    Ran Out of Food in the Last Year: Never true  Transportation Needs: No Transportation Needs (12/12/2022)   PRAPARE - Administrator, Civil Service (Medical): No    Lack of Transportation (Non-Medical): No  Physical Activity: Inactive (12/12/2022)   Exercise Vital Sign    Days of Exercise per Week: 0 days    Minutes of Exercise per Session: 0 min  Stress: No Stress Concern Present (12/12/2022)   [employer] of Occupational Health - Occupational Stress Questionnaire    Feeling of Stress : Not at all  Social Connections: Moderately Isolated (12/12/2022)   Social Connection and Isolation Panel [NHANES]    Frequency of Communication with Friends and Family: More than three times a week    Frequency of Social Gatherings with Friends and Family: Once a week    Attends Religious Services: Never    Database administrator or Organizations: No    Attends Banker Meetings: Never    Marital Status: Married  Catering manager Violence: Not At Risk (12/12/2022)    Humiliation, Afraid, Rape, and Kick questionnaire    Fear of Current or Ex-Partner: No    Emotionally Abused: No    Physically Abused: No    Sexually Abused: No                                                                                                  Objective:  Physical Exam: There were no vitals taken for this visit.   Physical  Exam Constitutional:      General: She is not in acute distress.    Appearance: Normal appearance. She is ill-appearing (Due to chronic pain). She is not toxic-appearing.  HENT:     Head: Normocephalic and atraumatic.     Nose: Nose normal. No congestion.  Eyes:     General: No scleral icterus.    Extraocular Movements: Extraocular movements intact.  Cardiovascular:     Rate and Rhythm: Normal rate and regular rhythm.     Pulses: Normal pulses.     Heart sounds: Normal heart sounds.  Pulmonary:     Effort: Pulmonary effort is normal. No respiratory distress.     Breath sounds: Normal breath sounds.  Abdominal:     General: Abdomen is flat. Bowel sounds are normal. There is no distension.     Palpations: Abdomen is soft.     Tenderness: There is no abdominal tenderness. There is no guarding or rebound.  Musculoskeletal:        General: Normal range of motion.     Lumbar back: Tenderness and bony tenderness present. Scoliosis present.     Comments: Extensive kyphosis  Lymphadenopathy:     Cervical: No cervical adenopathy.  Skin:    General: Skin is warm and dry.     Findings: No rash.  Neurological:     General: No focal deficit present.     Mental Status: She is alert and oriented to person, place, and time. Mental status is at baseline.     Gait: Gait abnormal (Wheelchair-bound).  Psychiatric:        Mood and Affect: Mood is anxious.        Behavior: Behavior normal.        Thought Content: Thought content normal.        Judgment: Judgment normal.     No results found.  No results found for this or any previous visit (from the  past 2160 hours).      Carnell Christian, MD, MS

## 2023-10-15 NOTE — Patient Instructions (Addendum)
  VISIT SUMMARY: Today, we discussed your severe back pain, chronic hip pain, morning headaches, nausea, and insomnia. We reviewed your current medications and symptoms, and we have made some adjustments to your treatment plan to help manage your pain and improve your overall well-being.  YOUR PLAN: -CHRONIC BACK PAIN: Chronic back pain is long-lasting pain in your back that can be very uncomfortable and affect your daily activities. We will start you on a low dose of oxycodone  (2.5 to 5 mg every 8 hours as needed, with a maximum of 30 tablets) to help manage your pain. Please monitor for side effects like constipation and breathing difficulties. Do not take oxycodone  with acetaminophen  and codeine. We will discuss the potential side effects and risks of dependence.  -CHRONIC HIP PAIN: Chronic hip pain is long-lasting pain in your hip that can be related to prolonged sitting and can affect your mobility. We will continue to use heat for relief as it has been helpful for you.  -MORNING HEADACHES: Morning headaches are headaches that occur when you wake up and can be related to various factors, including medication use. Caffeine intake has been providing some relief, so you can continue this as needed.  -NAUSEA: Nausea is a feeling of sickness with an inclination to vomit. You have been using ondansetron  and a heating pad for relief, which has been effective. We will continue with ondansetron  as needed for nausea.  -INSOMNIA: Insomnia is difficulty falling or staying asleep. It may be worsened by pain and frequent urination. We will monitor your sleep patterns and make adjustments as needed.  -ANXIETY: We refilled your lorazepam  for as needed use.  INSTRUCTIONS: Please schedule a follow-up appointment in two weeks to evaluate the effectiveness of the new pain management regimen and overall symptom management.

## 2023-10-15 NOTE — Telephone Encounter (Signed)
 Copied from CRM [id number]. Topic: Clinical - Prescription Issue >> Oct 15, 2023 11:46 AM Caliyah H wrote: Reason for CRM: Axle from [facility] called regarding the patient's prescription for Oxycodone . He stated they are unable to fill the prescription as written due to the high dosage and instructions: "Take 2.5-5 tablets by mouth every 8 hours as needed for severe pain." He expressed concern that taking up to 5 tablets at a time is not appropriate and is requesting an updated prescription with revised dosing instructions.  Callback Number: : [phone]

## 2023-10-16 MED ORDER — OXYCODONE-ACETAMINOPHEN 5-325 MG PO TABS: 0.5000 | ORAL_TABLET | Freq: Three times a day (TID) | ORAL | 0 refills | Status: AC | PRN

## 2023-10-16 MED ORDER — OXYCODONE-ACETAMINOPHEN 5-325 MG PO TABS: 0.5000 | ORAL_TABLET | Freq: Three times a day (TID) | ORAL | 0 refills | Status: DC | PRN

## 2023-10-16 NOTE — Telephone Encounter (Signed)
 Pharmacy received it and has it ready for patient. Dm/cma

## 2023-11-05 ENCOUNTER — Ambulatory Visit: Admitting: Family Medicine

## 2024-01-08 ENCOUNTER — Telehealth: Payer: Self-pay | Admitting: Family Medicine

## 2024-01-08 DIAGNOSIS — J0110 Acute frontal sinusitis, unspecified: Secondary | ICD-10-CM

## 2024-01-08 DIAGNOSIS — R051 Acute cough: Secondary | ICD-10-CM

## 2024-01-08 MED ORDER — BENZONATATE 100 MG PO CAPS: 100.0000 mg | ORAL_CAPSULE | Freq: Two times a day (BID) | ORAL | 0 refills | Status: AC | PRN

## 2024-01-08 NOTE — Telephone Encounter (Signed)
 Called patient and informed her that Dr. Sebastian will send the Rx Tessalon  100 mg BID as needed for cough and that she will need to schedule an appointment with him. Asked if she had time to schedule an appointment at this time and she stated that she was told last time to follow up as needed. I informed her that at her May appointment Dr. Sebastian wanted her to follow up in 2 weeks. She stated that she will give the office a call on Monday to schedule an appointment.

## 2024-01-08 NOTE — Telephone Encounter (Signed)
 Copied from CRM #[id number]. Topic: Clinical - Medication Question >> Jan 08, 2024  2:58 PM Aisha D wrote: Reason for CRM: Pt's husband stated that the pt is requesting a prescription for coughing. Clem stated that it was a pill the pt was prescribed a year ago but they forget the name and would like for Dr.Thompson to prescribe that to the pt again. Pt would like a callback with an update of this request.

## 2024-01-11 NOTE — Telephone Encounter (Signed)
 Noted

## 2024-02-15 ENCOUNTER — Other Ambulatory Visit: Payer: Self-pay | Admitting: Family Medicine

## 2024-02-15 DIAGNOSIS — J0110 Acute frontal sinusitis, unspecified: Secondary | ICD-10-CM

## 2024-02-15 DIAGNOSIS — R051 Acute cough: Secondary | ICD-10-CM

## 2024-04-27 ENCOUNTER — Other Ambulatory Visit: Payer: Self-pay | Admitting: Family Medicine

## 2024-04-27 DIAGNOSIS — F419 Anxiety disorder, unspecified: Secondary | ICD-10-CM

## 2024-04-29 ENCOUNTER — Ambulatory Visit
